# Patient Record
Sex: Male | Born: 1975 | Race: Black or African American | Marital: Single
Health system: Midwestern US, Community
[De-identification: ages and names within clinical notes are randomized; demographics above are authoritative.]

## PROBLEM LIST (undated history)

## (undated) DIAGNOSIS — I2510 Atherosclerotic heart disease of native coronary artery without angina pectoris: Secondary | ICD-10-CM

## (undated) DIAGNOSIS — I2119 ST elevation (STEMI) myocardial infarction involving other coronary artery of inferior wall: Secondary | ICD-10-CM

## (undated) DIAGNOSIS — E119 Type 2 diabetes mellitus without complications: Secondary | ICD-10-CM

## (undated) DIAGNOSIS — R202 Paresthesia of skin: Secondary | ICD-10-CM

## (undated) DIAGNOSIS — E13621 Other specified diabetes mellitus with foot ulcer: Secondary | ICD-10-CM

## (undated) DIAGNOSIS — W3400XA Accidental discharge from unspecified firearms or gun, initial encounter: Secondary | ICD-10-CM

## (undated) DIAGNOSIS — L97509 Non-pressure chronic ulcer of other part of unspecified foot with unspecified severity: Secondary | ICD-10-CM

## (undated) DIAGNOSIS — N189 Chronic kidney disease, unspecified: Secondary | ICD-10-CM

## (undated) HISTORY — PX: OTHER SURGICAL HISTORY: SHX169

## (undated) HISTORY — DX: Paresthesia of skin: R20.2

## (undated) HISTORY — PX: FEMUR FRACTURE SURGERY: SHX633

---
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
## 1997-10-29 HISTORY — PX: FEMUR FRACTURE SURGERY: SHX633

## 2002-07-04 ENCOUNTER — Emergency Department (HOSPITAL_COMMUNITY): Admission: EM | Admit: 2002-07-04 | Discharge: 2002-07-04 | Payer: Self-pay | Admitting: *Deleted

## 2002-07-09 ENCOUNTER — Emergency Department (HOSPITAL_COMMUNITY): Admission: EM | Admit: 2002-07-09 | Discharge: 2002-07-09 | Payer: Self-pay | Admitting: Emergency Medicine

## 2003-11-19 ENCOUNTER — Emergency Department (HOSPITAL_COMMUNITY): Admission: EM | Admit: 2003-11-19 | Discharge: 2003-11-19 | Payer: Self-pay | Admitting: Emergency Medicine

## 2009-10-19 ENCOUNTER — Emergency Department (HOSPITAL_COMMUNITY): Admission: EM | Admit: 2009-10-19 | Discharge: 2009-10-19 | Payer: Self-pay | Admitting: Internal Medicine

## 2010-01-09 ENCOUNTER — Emergency Department (HOSPITAL_COMMUNITY): Admission: EM | Admit: 2010-01-09 | Discharge: 2010-01-10 | Payer: Self-pay | Admitting: Emergency Medicine

## 2011-01-29 LAB — GLUCOSE, CAPILLARY: Glucose-Capillary: 354 mg/dL — ABNORMAL HIGH (ref 70–99)

## 2011-05-17 ENCOUNTER — Encounter: Payer: Self-pay | Admitting: Emergency Medicine

## 2011-05-17 ENCOUNTER — Emergency Department (HOSPITAL_COMMUNITY)
Admission: EM | Admit: 2011-05-17 | Discharge: 2011-05-17 | Disposition: A | Discharge status: Home / Self Care | Payer: Self-pay | Attending: Emergency Medicine | Admitting: Emergency Medicine

## 2011-05-17 DIAGNOSIS — K0889 Other specified disorders of teeth and supporting structures: Secondary | ICD-10-CM

## 2011-05-17 DIAGNOSIS — K089 Disorder of teeth and supporting structures, unspecified: Secondary | ICD-10-CM | POA: Insufficient documentation

## 2011-05-17 DIAGNOSIS — E119 Type 2 diabetes mellitus without complications: Secondary | ICD-10-CM | POA: Insufficient documentation

## 2011-05-17 DIAGNOSIS — R739 Hyperglycemia, unspecified: Secondary | ICD-10-CM

## 2011-05-17 DIAGNOSIS — F17200 Nicotine dependence, unspecified, uncomplicated: Secondary | ICD-10-CM | POA: Insufficient documentation

## 2011-05-17 LAB — BASIC METABOLIC PANEL
GFR calc Af Amer: >60 mL/min (ref >60)
GFR calc non Af Amer: >60 mL/min (ref >60)
Potassium: 4.6 mEq/L (ref 3.5–5.1)
Sodium: 132 mEq/L — ABNORMAL LOW (ref 135–145)

## 2011-05-17 MED ORDER — OXYCODONE-ACETAMINOPHEN 5-325 MG PO TABS
1.0000 | ORAL_TABLET | Freq: Once | ORAL | Status: AC
  Administered 2011-05-17: 1 via ORAL
  Filled 2011-05-17: qty 1

## 2011-05-17 MED ORDER — HYDROCODONE-ACETAMINOPHEN 5-325 MG PO TABS: ORAL_TABLET | ORAL | Status: AC

## 2011-05-17 MED ORDER — METFORMIN HCL 500 MG PO TABS: 500.0000 mg | ORAL_TABLET | Freq: Two times a day (BID) | ORAL | Status: DC

## 2011-05-17 MED ORDER — PENICILLIN V POTASSIUM 500 MG PO TABS: 500.0000 mg | ORAL_TABLET | Freq: Four times a day (QID) | ORAL | Status: AC

## 2011-05-17 MED ORDER — PENICILLIN V POTASSIUM 250 MG PO TABS
500.0000 mg | ORAL_TABLET | Freq: Once | ORAL | Status: AC
  Administered 2011-05-17: 500 mg via ORAL
  Filled 2011-05-17: qty 2

## 2011-05-17 MED ORDER — METFORMIN HCL 500 MG PO TABS
1000.0000 mg | ORAL_TABLET | Freq: Once | ORAL | Status: AC
  Administered 2011-05-17: 1000 mg via ORAL
  Filled 2011-05-17: qty 2

## 2011-05-17 NOTE — ED Notes (Signed)
Pt states right upper tooth ache for 2 days. Pt has no dentist & does not have a PCP.

## 2011-05-17 NOTE — ED Provider Notes (Addendum)
History     Chief Complaint  Patient presents with  . Dental Pain  . Hyperglycemia   HPI Comments: Patient c/o toothache for 2 days and also states that his blood sugar may be elevated.  States that he had hx of diabetes but has been without his medication or glucometer for several weeks to months.  Patient is a 35 y.o. male presenting with tooth pain. The history is provided by the patient.  Dental PainThe primary symptoms include mouth pain. Primary symptoms do not include dental injury, oral bleeding, headaches, fever, shortness of breath, sore throat, angioedema or cough. The symptoms began 2 days ago. The symptoms are worsening. The symptoms are chronic. The symptoms occur constantly.  Mouth pain began 24 -48 hours ago. Mouth pain occurs constantly. Mouth pain is unchanged. Affected locations include: teeth.  Additional symptoms include: dental sensitivity to temperature and gum tenderness. Additional symptoms do not include: gum swelling, purulent gums, trismus, facial swelling, trouble swallowing, pain with swallowing, excessive salivation, drooling and ear pain.    Past Medical History  Diagnosis Date  . Diabetes mellitus     Past Surgical History  Procedure Date  . Femur fracture surgery     History reviewed. No pertinent family history.  History  Substance Use Topics  . Smoking status: Current Everyday Smoker -- 0.5 packs/day  . Smokeless tobacco: Not on file  . Alcohol Use: 0.0 oz/week    1-2 Cans of beer per week      Review of Systems  Constitutional: Positive for chills. Negative for fever.  HENT: Positive for dental problem. Negative for ear pain, sore throat, facial swelling, drooling, trouble swallowing, neck pain and neck stiffness.   Respiratory: Negative for cough, chest tightness and shortness of breath.   Cardiovascular: Negative for chest pain and leg swelling.  Gastrointestinal: Negative for nausea, vomiting and constipation.  Genitourinary:  Negative for dysuria and frequency.  Musculoskeletal: Negative.   Skin: Negative.   Neurological: Negative for dizziness, seizures, syncope, facial asymmetry, speech difficulty, weakness, numbness and headaches.  Hematological: Does not bruise/bleed easily.  Psychiatric/Behavioral: Negative for behavioral problems and confusion.    Physical Exam  BP 162/113  Pulse 99  Temp(Src) 98.4 F (36.9 C) (Oral)  Resp 20  Ht 5\' 11"  (1.803 m)  Wt 200 lb (90.719 kg)  BMI 27.89 kg/m2  SpO2 98%  Physical Exam  Nursing note and vitals reviewed. Constitutional: He appears well-developed and well-nourished.  Non-toxic appearance. He does not have a sickly appearance. He appears distressed.       Patient is holding his jaw and appears distressed due to dental pain  HENT:  Head: Normocephalic and atraumatic.  Right Ear: External ear normal.  Left Ear: External ear normal.  Mouth/Throat: Oropharynx is clear and moist.  Eyes: EOM are normal. Pupils are equal, round, and reactive to light.  Neck: Normal range of motion. Neck supple. No thyromegaly present.  Cardiovascular: Normal rate, regular rhythm and normal heart sounds.   Pulmonary/Chest: No respiratory distress. He has no wheezes. He exhibits no tenderness.  Abdominal: Soft. He exhibits no distension. There is no tenderness. There is no rebound and no guarding.  Musculoskeletal: Normal range of motion. He exhibits no edema and no tenderness.  Lymphadenopathy:    He has no cervical adenopathy.  Neurological: He is alert. He exhibits normal muscle tone. Coordination normal.  Skin: Skin is warm and dry.  Psychiatric: He has a normal mood and affect.    ED Course  Procedures  MDM  2115  Patient feeling better, vitals stable.  No clinical signs or symptoms of DKA.  Agrees to f/u with the HD for recheck of his diabetes.  I will restart his metformin and write rx for his glucometer strips.         Danah Reinecke L. [place], [place] 05/27/11  0011  Theta Leaf L. [place], [place] 06/21/11 1439

## 2011-05-17 NOTE — ED Notes (Signed)
Pt c/o rt sided mouth pain x 2 days.

## 2011-05-19 ENCOUNTER — Emergency Department (HOSPITAL_COMMUNITY)
Admission: EM | Admit: 2011-05-19 | Discharge: 2011-05-19 | Disposition: A | Discharge status: Home / Self Care | Payer: Self-pay | Attending: Emergency Medicine | Admitting: Emergency Medicine

## 2011-05-19 ENCOUNTER — Encounter (HOSPITAL_COMMUNITY): Payer: Self-pay

## 2011-05-19 DIAGNOSIS — K089 Disorder of teeth and supporting structures, unspecified: Secondary | ICD-10-CM | POA: Insufficient documentation

## 2011-05-19 DIAGNOSIS — K029 Dental caries, unspecified: Secondary | ICD-10-CM | POA: Insufficient documentation

## 2011-05-19 DIAGNOSIS — K1370 Unspecified lesions of oral mucosa: Secondary | ICD-10-CM | POA: Insufficient documentation

## 2011-05-19 DIAGNOSIS — F17200 Nicotine dependence, unspecified, uncomplicated: Secondary | ICD-10-CM | POA: Insufficient documentation

## 2011-05-19 DIAGNOSIS — K1379 Other lesions of oral mucosa: Secondary | ICD-10-CM

## 2011-05-19 MED ORDER — TRAMADOL HCL 50 MG PO TABS
50.0000 mg | ORAL_TABLET | Freq: Once | ORAL | Status: AC
  Administered 2011-05-19: 50 mg via ORAL
  Filled 2011-05-19: qty 1

## 2011-05-19 NOTE — ED Provider Notes (Signed)
History     Chief Complaint  Patient presents with  . Dental Pain    mouth pain   HPI Comments: Patient with poor dental hygiene, multiple caries, here with continued mouth and tooth pain. Seen yesterday and started on Pen VK, hydrocodone. He states medicine does not help pain behind his front teeth and top of his mouth. Denies fever, chills, nausea, vomiting.  Patient is a 35 y.o. male presenting with tooth pain. The history is provided by the patient.  Dental PainThe primary symptoms include mouth pain and oral lesions. The symptoms began 3 to 5 days ago. The symptoms are unchanged. The symptoms occur constantly.  Affected locations include: gum(s), teeth and palate. At its highest the mouth pain was at 10/10. The mouth pain is currently at 10/10.  The oral lesion is unchanged. Affected locations include: palate. The lesion is described as ulcerous and fluid-filled.  Additional symptoms include: gum tenderness. Additional symptoms do not include: dental sensitivity to temperature, gum swelling, pain with swallowing and excessive salivation.    Past Medical History  Diagnosis Date  . Diabetes mellitus     Past Surgical History  Procedure Date  . Femur fracture surgery     History reviewed. No pertinent family history.  History  Substance Use Topics  . Smoking status: Current Everyday Smoker -- 0.5 packs/day  . Smokeless tobacco: Not on file  . Alcohol Use: 0.0 oz/week    1-2 Cans of beer per week      Review of Systems  All other systems reviewed and are negative.    Physical Exam  BP 121/85  Pulse 137  Temp(Src) 99 F (37.2 C) (Oral)  Resp 20  SpO2 97%  Physical Exam  Nursing note and vitals reviewed. Constitutional: He is oriented to person, place, and time. He appears well-developed and well-nourished. He appears distressed.  HENT:  Head: Normocephalic and atraumatic.       Shallow apthos ulcerations with blisters to palate and behind front upper teeth. No  other lesions noted. Multiple caris.  Eyes: EOM are normal. Pupils are equal, round, and reactive to light.  Neck: Normal range of motion.  Cardiovascular:       tachycardic  Pulmonary/Chest: Effort normal and breath sounds normal.  Abdominal: Soft.  Musculoskeletal: Normal range of motion.  Lymphadenopathy:    He has no cervical adenopathy.  Neurological: He is alert and oriented to person, place, and time.  Skin: Skin is warm and dry.    ED Course  Procedures  MDM       Gypsy Balsam. Olin Hauser, MD 05/19/11 [id number]

## 2011-05-19 NOTE — ED Notes (Signed)
MD at bedside. 

## 2011-05-19 NOTE — ED Notes (Signed)
Pt seen here last Wednesday for tooth pain, states he also had bumps/?abcess to roof of mouth which is worse now.

## 2011-05-20 ENCOUNTER — Emergency Department (HOSPITAL_COMMUNITY)
Admission: EM | Admit: 2011-05-20 | Discharge: 2011-05-20 | Disposition: A | Discharge status: Home / Self Care | Payer: Self-pay | Attending: Emergency Medicine | Admitting: Emergency Medicine

## 2011-05-20 DIAGNOSIS — K089 Disorder of teeth and supporting structures, unspecified: Secondary | ICD-10-CM | POA: Insufficient documentation

## 2011-05-23 NOTE — ED Provider Notes (Signed)
History     Chief Complaint  Patient presents with  . Dental Pain  . Hyperglycemia   HPI  Past Medical History  Diagnosis Date  . Diabetes mellitus     Past Surgical History  Procedure Date  . Femur fracture surgery     History reviewed. No pertinent family history.  History  Substance Use Topics  . Smoking status: Current Everyday Smoker -- 0.5 packs/day  . Smokeless tobacco: Not on file  . Alcohol Use: 0.0 oz/week    1-2 Cans of beer per week      Review of Systems  Physical Exam  BP 162/113  Pulse 99  Temp(Src) 98.4 F (36.9 C) (Oral)  Resp 20  Ht 5\' 11"  (1.803 m)  Wt 200 lb (90.719 kg)  BMI 27.89 kg/m2  SpO2 98%  Physical Exam  ED Course  Procedures  MDM Medical screening examination/treatment/procedure(s) were performed by non-physician practitioner and as supervising physician I was immediately available for consultation/collaboration.       Dot Lanes, MD 05/23/11 2147

## 2011-05-30 ENCOUNTER — Encounter (HOSPITAL_COMMUNITY): Payer: Self-pay | Admitting: *Deleted

## 2011-05-30 DIAGNOSIS — Z5320 Procedure and treatment not carried out because of patient's decision for unspecified reasons: Secondary | ICD-10-CM | POA: Insufficient documentation

## 2011-05-30 DIAGNOSIS — R5383 Other fatigue: Secondary | ICD-10-CM | POA: Insufficient documentation

## 2011-05-30 DIAGNOSIS — R5381 Other malaise: Secondary | ICD-10-CM | POA: Insufficient documentation

## 2011-05-30 NOTE — ED Notes (Signed)
Pt states that he felt bad earlier in the day, headache, weakness, toothache, abscess on roof of mouth

## 2011-05-30 NOTE — ED Provider Notes (Signed)
Medical screening examination/treatment/procedure(s) were performed by non-physician practitioner and as supervising physician I was immediately available for consultation/collaboration.   Dot Lanes, MD 05/30/11 [phone]

## 2011-05-31 ENCOUNTER — Emergency Department (HOSPITAL_COMMUNITY)
Admission: EM | Admit: 2011-05-31 | Discharge: 2011-05-31 | Discharge status: Against Medical Advice | Payer: Self-pay | Attending: *Deleted | Admitting: *Deleted

## 2015-02-28 ENCOUNTER — Encounter (HOSPITAL_COMMUNITY): Payer: Self-pay

## 2015-02-28 ENCOUNTER — Emergency Department (INDEPENDENT_AMBULATORY_CARE_PROVIDER_SITE_OTHER)
Admission: EM | Admit: 2015-02-28 | Discharge: 2015-02-28 | Disposition: A | Discharge status: Home / Self Care | Payer: Self-pay | Source: Home / Self Care | Attending: Family Medicine | Admitting: Family Medicine

## 2015-02-28 DIAGNOSIS — L02611 Cutaneous abscess of right foot: Secondary | ICD-10-CM

## 2015-02-28 DIAGNOSIS — L03115 Cellulitis of right lower limb: Secondary | ICD-10-CM

## 2015-02-28 MED ORDER — CEFTRIAXONE SODIUM 1 G IJ SOLR
INTRAMUSCULAR | Status: AC
Start: 2015-02-28 — End: 2015-02-28
  Filled 2015-02-28: qty 10

## 2015-02-28 MED ORDER — BACITRACIN 500 UNIT/GM EX OINT
1.0000 "application " | TOPICAL_OINTMENT | Freq: Once | CUTANEOUS | Status: AC
  Administered 2015-02-28: 1 via TOPICAL

## 2015-02-28 MED ORDER — CEFTRIAXONE SODIUM 1 G IJ SOLR
1.0000 g | Freq: Once | INTRAMUSCULAR | Status: AC
  Administered 2015-02-28: 1 g via INTRAMUSCULAR

## 2015-02-28 MED ORDER — CLINDAMYCIN HCL 300 MG PO CAPS: 300.0000 mg | ORAL_CAPSULE | Freq: Three times a day (TID) | ORAL | Status: DC

## 2015-02-28 MED ORDER — LIDOCAINE HCL (PF) 1 % IJ SOLN
INTRAMUSCULAR | Status: AC
  Filled 2015-02-28: qty 5

## 2015-02-28 NOTE — ED Notes (Signed)
Medication mixed w lidocaine for comfort

## 2015-02-28 NOTE — ED Provider Notes (Signed)
CSN: [id number]     Arrival date & time 02/28/15  1738 History   First MD Initiated Contact with Patient 02/28/15 1840     Chief Complaint  Patient presents with  . Foot Problem   (Consider location/radiation/quality/duration/timing/severity/associated sxs/prior Treatment) HPI Comments: 39 year old male from a halfway house and recently out of prison has a PMH of type 2 diabetes mellitus. Has been one week ago he developed a sore area to the base of the right fifth toe. This area has enlarged to a vesicular type lesion with underlying induration and swelling of the foot. He states the vesicle has been draining malodorous fluid.   Past Medical History  Diagnosis Date  . Diabetes mellitus    Past Surgical History  Procedure Laterality Date  . Femur fracture surgery     History reviewed. No pertinent family history. History  Substance Use Topics  . Smoking status: Current Every Day Smoker -- 0.50 packs/day  . Smokeless tobacco: Not on file  . Alcohol Use: 0.0 oz/week    1-2 Cans of beer per week    Review of Systems  Constitutional: Negative for fever and activity change.  Gastrointestinal: Negative.   Musculoskeletal: Negative.   Skin: Positive for color change and wound.  Neurological: Positive for numbness.  Psychiatric/Behavioral: Negative.   All other systems reviewed and are negative.   Allergies  Review of patient's allergies indicates no known allergies.  Home Medications   Prior to Admission medications   Medication Sig Start Date End Date Taking? Authorizing Provider  glipiZIDE (GLUCOTROL) 5 MG tablet Take by mouth daily before breakfast.   Yes Historical Provider, MD  clindamycin (CLEOCIN) 300 MG capsule Take 1 capsule (300 mg total) by mouth 3 (three) times daily. 02/28/15   Janne Napoleon, NP  metFORMIN (GLUCOPHAGE) 500 MG tablet Take 1 tablet (500 mg total) by mouth 2 (two) times daily with a meal. 05/17/11 05/16/12  Tammy Triplett, PA-C   BP 153/93 mmHg  Pulse 84   Temp(Src) 98.5 F (36.9 C) (Oral)  Resp 18  SpO2 99% Physical Exam  Constitutional: He is oriented to person, place, and time. He appears well-developed and well-nourished. No distress.  Pulmonary/Chest: Effort normal. No respiratory distress.  Musculoskeletal: Normal range of motion. He exhibits edema and tenderness.  Neurological: He is alert and oriented to person, place, and time. He exhibits normal muscle tone.  Skin: Skin is warm and dry.  Right foot with edema. Small areas of light erythema. There is a 3 cm annular lesion to the fifth toe aspect of the foot at its base with visible fluid under the cover. There is surrounding erythema. No lymphangitis.   Psychiatric: He has a normal mood and affect.  Nursing note and vitals reviewed.   ED Course  INCISION AND DRAINAGE Date/Time: 02/28/2015 7:21 PM Performed by: Marcha Dutton, Kyarah Enamorado Authorized by: Gregor Hams Consent: Verbal consent obtained. Risks and benefits: risks, benefits and alternatives were discussed Consent given by: patient Patient understanding: patient states understanding of the procedure being performed Patient identity confirmed: verbally with patient Type: abscess Body area: lower extremity Location details: right foot Local anesthetic: topical anesthetic Patient sedated: no Scalpel size: 11 Incision type: single straight Complexity: simple Drainage: purulent and  serosanguinous Drainage amount: scant Wound treatment: wound left open Patient tolerance: Patient tolerated the procedure well with no immediate complications   (including critical care time) Labs Review Labs Reviewed  CULTURE, ROUTINE-ABSCESS    Imaging Review No results found.   MDM  1. Cellulitis of foot, right   2. Foot abscess, right    Diabetic foot ulcer forming with cellulitis. The lesion was incised. There was serosanguineous/bloody fluid with a very small amount of purulence expressed from the wound. Much of the underlying  tissue is inflammation and induration. Betadine soak Apply dressing with bacitracin Clindamycin 300 mg 3 times a day Ceftriaxone 1 g IM now Keep the wound clean, soap and water daily and change dressing daily Return to urgent care in 2 days for wound check. If not improving will need to send to wound care center.    Janne Napoleon, NP 02/28/15 [phone]

## 2015-02-28 NOTE — ED Notes (Signed)
Patient concerned about 1 week duration of pain, bad smell, draining right foot.

## 2015-02-28 NOTE — Discharge Instructions (Signed)
Abscess An abscess is an infected area that contains a collection of pus and debris.It can occur in almost any part of the body. An abscess is also known as a furuncle or boil. CAUSES  An abscess occurs when tissue gets infected. This can occur from blockage of oil or sweat glands, infection of hair follicles, or a minor injury to the skin. As the body tries to fight the infection, pus collects in the area and creates pressure under the skin. This pressure causes pain. People with weakened immune systems have difficulty fighting infections and get certain abscesses more often.  SYMPTOMS Usually an abscess develops on the skin and becomes a painful mass that is red, warm, and tender. If the abscess forms under the skin, you may feel a moveable soft area under the skin. Some abscesses break open (rupture) on their own, but most will continue to get worse without care. The infection can spread deeper into the body and eventually into the bloodstream, causing you to feel ill.  DIAGNOSIS  Your caregiver will take your medical history and perform a physical exam. A sample of fluid may also be taken from the abscess to determine what is causing your infection. TREATMENT  Your caregiver may prescribe antibiotic medicines to fight the infection. However, taking antibiotics alone usually does not cure an abscess. Your caregiver may need to make a small cut (incision) in the abscess to drain the pus. In some cases, gauze is packed into the abscess to reduce pain and to continue draining the area. HOME CARE INSTRUCTIONS   Only take over-the-counter or prescription medicines for pain, discomfort, or fever as directed by your caregiver.  If you were prescribed antibiotics, take them as directed. Finish them even if you start to feel better.  If gauze is used, follow your caregiver's directions for changing the gauze. Clean with soap and water daily and keep dressed.  To avoid spreading the infection:  Keep  your draining abscess covered with a bandage.  Wash your hands well.  Do not share personal care items, towels, or whirlpools with others.  Avoid skin contact with others.  Keep your skin and clothes clean around the abscess.  Keep all follow-up appointments as directed by your caregiver. SEEK MEDICAL CARE IF:   You have increased pain, swelling, redness, fluid drainage, or bleeding.  You have muscle aches, chills, or a general ill feeling.  You have a fever. MAKE SURE YOU:   Understand these instructions.  Will watch your condition.  Will get help right away if you are not doing well or get worse. Document Released: 07/25/2005 Document Revised: 04/15/2012 Document Reviewed: 12/28/2011 [facility] Patient Information 2015 [place], [place]. This information is not intended to replace advice given to you by your health care provider. Make sure you discuss any questions you have with your health care provider.  Cellulitis Cellulitis is an infection of the skin and the tissue beneath it. The infected area is usually red and tender. Cellulitis occurs most often in the arms and lower legs.  CAUSES  Cellulitis is caused by bacteria that enter the skin through cracks or cuts in the skin. The most common types of bacteria that cause cellulitis are staphylococci and streptococci. SIGNS AND SYMPTOMS   Redness and warmth.  Swelling.  Tenderness or pain.  Fever. DIAGNOSIS  Your health care provider can usually determine what is wrong based on a physical exam. Blood tests may also be done. TREATMENT  Treatment usually involves taking an antibiotic  medicine. HOME CARE INSTRUCTIONS   Take your antibiotic medicine as directed by your health care provider. Finish the antibiotic even if you start to feel better.  Keep the infected arm or leg elevated to reduce swelling.  Apply a warm cloth to the affected area up to 4 times per day to relieve pain.  Take medicines only as directed by  your health care provider.  Keep all follow-up visits as directed by your health care provider. SEEK MEDICAL CARE IF:   You notice red streaks coming from the infected area.  Your red area gets larger or turns dark in color.  Your bone or joint underneath the infected area becomes painful after the skin has healed.  Your infection returns in the same area or another area.  You notice a swollen bump in the infected area.  You develop new symptoms.  You have a fever. SEEK IMMEDIATE MEDICAL CARE IF:   You feel very sleepy.  You develop vomiting or diarrhea.  You have a general ill feeling (malaise) with muscle aches and pains. MAKE SURE YOU:   Understand these instructions.  Will watch your condition.  Will get help right away if you are not doing well or get worse. Document Released: 07/25/2005 Document Revised: 03/01/2014 Document Reviewed: 12/31/2011 [facility] Patient Information 2015 [place], [place]. This information is not intended to replace advice given to you by your health care provider. Make sure you discuss any questions you have with your health care provider.

## 2015-03-02 ENCOUNTER — Encounter (HOSPITAL_COMMUNITY): Payer: Self-pay | Admitting: Emergency Medicine

## 2015-03-02 ENCOUNTER — Emergency Department (INDEPENDENT_AMBULATORY_CARE_PROVIDER_SITE_OTHER)
Admission: EM | Admit: 2015-03-02 | Discharge: 2015-03-02 | Disposition: A | Discharge status: Home / Self Care | Payer: Self-pay | Source: Home / Self Care | Attending: Family Medicine | Admitting: Family Medicine

## 2015-03-02 DIAGNOSIS — L97509 Non-pressure chronic ulcer of other part of unspecified foot with unspecified severity: Secondary | ICD-10-CM

## 2015-03-02 DIAGNOSIS — E08621 Diabetes mellitus due to underlying condition with foot ulcer: Secondary | ICD-10-CM

## 2015-03-02 NOTE — ED Provider Notes (Signed)
CSN: [id number]     Arrival date & time 03/02/15  1749 History   First MD Initiated Contact with Patient 03/02/15 1857     Chief Complaint  Patient presents with  . Follow-up   (Consider location/radiation/quality/duration/timing/severity/associated sxs/prior Treatment) HPI      39 year old male presents for follow-up of the diabetic foot ulcer on his left lateral foot adjacent to the fifth MTP. This started approximately one week ago. He was seen here a few days ago, this was treated with incision and drainage and he was started on clindamycin. He was only able to pick up the prescription yesterday and he got 1 dose yesterday and 1 dose so far today. He feels like this may be slightly better since he had it incised and drained. He denies any systemic symptoms. He specifically denies that this is a long-standing ulcer that has been there for longer than 1 week. Denies any pain elsewhere or any other similar infections or lesions. He lives in a halfway house, he was recently incarcerated.   Past Medical History  Diagnosis Date  . Diabetes mellitus    Past Surgical History  Procedure Laterality Date  . Femur fracture surgery     No family history on file. History  Substance Use Topics  . Smoking status: Current Every Day Smoker -- 0.50 packs/day  . Smokeless tobacco: Not on file  . Alcohol Use: 0.0 oz/week    1-2 Cans of beer per week    Review of Systems  Skin: Positive for wound (left foot ulcer, see history of present illness).  All other systems reviewed and are negative.   Allergies  Review of patient's allergies indicates no known allergies.  Home Medications   Prior to Admission medications   Medication Sig Start Date End Date Taking? Authorizing Provider  clindamycin (CLEOCIN) 300 MG capsule Take 1 capsule (300 mg total) by mouth 3 (three) times daily. 02/28/15  Yes Janne Napoleon, NP  glipiZIDE (GLUCOTROL) 5 MG tablet Take by mouth daily before breakfast.   Yes Historical  Provider, MD  metFORMIN (GLUCOPHAGE) 500 MG tablet Take 1 tablet (500 mg total) by mouth 2 (two) times daily with a meal. 05/17/11 05/16/12  Tammy Triplett, PA-C   BP 142/94 mmHg  Pulse 88  Temp(Src) 99 F (37.2 C) (Oral)  Resp 16  SpO2 99% Physical Exam  Constitutional: He is oriented to person, place, and time. He appears well-developed and well-nourished. No distress.  HENT:  Head: Normocephalic.  Pulmonary/Chest: Effort normal. No respiratory distress.  Neurological: He is alert and oriented to person, place, and time. Coordination normal.  Skin: Skin is warm and dry. Lesion (See image below. Minimal tenderness, no surrounding erythema or induration) noted. No rash noted. He is not diaphoretic.  Psychiatric: He has a normal mood and affect. Judgment normal.  Nursing note and vitals reviewed.     ED Course  Procedures (including critical care time) Labs Review Labs Reviewed - No data to display  Imaging Review No results found.   MDM   1. Diabetic foot ulcer associated with diabetes mellitus due to underlying condition, unspecified laterality    he should continue the antibiotics and follow-up again in 3 days for reassessment. We're unable to assess how well the antibiotics may be working because he only started them yesterday. No signs of osteomyelitis at this point.   Liam Graham, PA-C 03/02/15 2144

## 2015-03-02 NOTE — ED Notes (Signed)
Pt is here for a f/u from 5/2; cellulitis of left foot; taking antibiotics and tolerating well Reports it's getting better Alert, no signs of acute distress.

## 2015-03-02 NOTE — Discharge Instructions (Signed)
Skin Ulcer A skin ulcer is an open sore that can be shallow or deep. Skin ulcers sometimes become infected and are difficult to treat. It may be 1 month or longer before real healing progress is made. CAUSES   Injury.  Problems with the veins or arteries.  Diabetes.  Insect bites.  Bedsores.  Inflammatory conditions. SYMPTOMS   Pain, redness, swelling, and tenderness around the ulcer.  Fever.  Bleeding from the ulcer.  Yellow or clear fluid coming from the ulcer. DIAGNOSIS  There are many types of skin ulcers. Any open sores will be examined. Certain tests will be done to determine the kind of ulcer you have. The right treatment depends on the type of ulcer you have. TREATMENT  Treatment is a long-term challenge. It may include:  Wearing an elastic wrap, compression stockings, or gel cast over the ulcer area.  Taking antibiotic medicines or putting antibiotic creams on the affected area if there is an infection. HOME CARE INSTRUCTIONS  Put on your bandages (dressings), wraps, or casts over the ulcer as directed by your caregiver.  Change all dressings as directed by your caregiver.  Take all medicines as directed by your caregiver.  Keep the affected area clean and dry.  Avoid injuries to the affected area.  Eat a well-balanced, healthy diet that includes plenty of fruit and vegetables.  If you smoke, consider quitting or decreasing the amount of cigarettes you smoke.  Once the ulcer heals, get regular exercise as directed by your caregiver.  Work with your caregiver to make sure your blood pressure, cholesterol, and diabetes are well-controlled.  Keep your skin moisturized. Dry skin can crack and lead to skin ulcers. SEEK IMMEDIATE MEDICAL CARE IF:   Your pain gets worse.  You have swelling, redness, or fluids around the ulcer.  You have chills.  You have a fever. MAKE SURE YOU:   Understand these instructions.  Will watch your condition.  Will get  help right away if you are not doing well or get worse. Document Released: 11/22/2004 Document Revised: 01/07/2012 Document Reviewed: 06/01/2011 [facility] Patient Information 2015 [place], [place]. This information is not intended to replace advice given to you by your health care provider. Make sure you discuss any questions you have with your health care provider.  How to Avoid Diabetes Problems You can do a lot to prevent or slow down diabetes problems. Following your diabetes plan and taking care of yourself can reduce your risk of serious or life-threatening complications. Below, you will find certain things you can do to prevent diabetes problems. MANAGE YOUR DIABETES Follow your health care provider's, nurse educator's, and dietitian's instructions for managing your diabetes. They will teach you the basics of diabetes care. They can help answer questions you may have. Learn about diabetes and make healthy choices regarding eating and physical activity. Monitor your blood glucose level regularly. Your health care provider will help you decide how often to check your blood glucose level depending on your treatment goals and how well you are meeting them.  DO NOT USE NICOTINE Nicotine and diabetes are a dangerous combination. Nicotine raises your risk for diabetes problems. If you quit using nicotine, you will lower your risk for heart attack, stroke, nerve disease, and kidney disease. Your cholesterol and your blood pressure levels may improve. Your blood circulation will also improve. Do not use any tobacco products, including cigarettes, chewing tobacco, or electronic cigarettes. If you need help quitting, ask your health care provider. KEEP YOUR BLOOD  PRESSURE UNDER CONTROL Keeping your blood pressure under control will help prevent damage to your eyes, kidneys, heart, and blood vessels. Blood pressure consists of two numbers. The top number should be below 120, and the bottom number should be below  80 (120/80). Keep your blood pressure as close to these numbers as you can. If you already have kidney disease, you may want even lower blood pressure to protect your kidneys. Talk to your health care provider to make sure that your blood pressure goal is right for your needs. Meal planning, medicines, and exercise can help you reach your blood pressure target. Have your blood pressure checked at every visit with your health care provider. KEEP YOUR CHOLESTEROL UNDER CONTROL Normal cholesterol levels will help prevent heart disease and stroke. These are the biggest health problems for people with diabetes. Keeping cholesterol levels under control can also help with blood flow. Have your cholesterol level checked at least once a year. Your health care provider may prescribe a medicine known as a statin. Statins lower your cholesterol. If you are not taking a statin, ask your health care provider if you should be. Meal planning, exercise, and medicines can help you reach your cholesterol targets.  SCHEDULE AND KEEP YOUR ANNUAL PHYSICAL EXAMS AND EYE EXAMS Your health care provider will tell you how often he or she wants to see you depending on your plan of treatment. It is important that you keep these appointments so that possible problems can be identified early and complications can be avoided or treated.  Every visit with your health care provider should include your weight, blood pressure, and an evaluation of your blood glucose control.  Your hemoglobin A1c should be checked:  At least twice a year if you are at your goal.  Every 3 months if there are changes in treatment.  If you are not meeting your goals.  Your blood lipids should be checked yearly. You should also be checked yearly to see if you have protein in your urine (microalbumin).  Schedule a dilated eye exam within 5 years of your diagnosis if you have type 1 diabetes, and then yearly. Schedule a dilated eye exam at diagnosis if you  have type 2 diabetes, and then yearly. All exams thereafter can be extended to every 2 to 3 years if one or more exams have been normal. KEEP YOUR VACCINES CURRENT The flu vaccine is recommended yearly. The formula for the vaccine changes every year and needs to be updated for the best protection against current viruses. It is recommended that people with diabetes who are over 69 years old get the pneumonia vaccine. In some cases, two separate shots may be given. Ask your health care provider if your pneumonia vaccination is up-to-date. However, there are some instances where another vaccine is recommended. Check with your health care provider. TAKE CARE OF YOUR FEET  Diabetes may cause you to have a poor blood supply (circulation) to your legs and feet. Because of this, the skin may be thinner, break easier, and heal more slowly. You also may have nerve damage in your legs and feet, causing decreased feeling. You may not notice minor injuries to your feet that could lead to serious problems or infections. Taking care of your feet is very important. Visual foot exams are performed at every routine medical visit. The exams check for cuts, injuries, or other problems with the feet. A comprehensive foot exam should be done yearly. This includes visual inspection as well as  assessing foot pulses and testing for loss of sensation. You should also do the following:  Inspect your feet daily for cuts, calluses, blisters, ingrown toenails, and signs of infection, such as redness, swelling, or pus.  Wash and dry your feet thoroughly, especially between the toes.  Avoid soaking your feet regularly in hot water baths.  Moisturize dry skin with lotion, avoiding areas between your toes.  Cut toenails straight across and file the edges.  Avoid shoes that do not fit well or have areas that irritate your skin.  Avoid going barefooted or wearing only socks. Your feet need protection. TAKE CARE OF YOUR  TEETH People with poorly controlled diabetes are more likely to have gum (periodontal) disease. These infections make diabetes harder to control. Periodontal diseases, if left untreated, can lead to tooth loss. Brush your teeth twice a day, floss, and see your dentist for checkups and cleaning every 6 months, or 2 times a year. ASK YOUR HEALTH CARE PROVIDER ABOUT TAKING ASPIRIN Taking aspirin daily is recommended to help prevent cardiovascular disease in people with and without diabetes. Ask your health care provider if this would benefit you and what dose he or she would recommend. DRINK RESPONSIBLY Moderate amounts of alcohol (less than 1 drink per day for adult women and less than 2 drinks per day for adult men) have a minimal effect on blood glucose if ingested with food. It is important to eat food with alcohol to avoid hypoglycemia. People should avoid alcohol if they have a history of alcohol abuse or dependence, if they are pregnant, and if they have liver disease, pancreatitis, advanced neuropathy, or severe hypertriglyceridemia. LESSEN STRESS Living with diabetes can be stressful. When you are under stress, your blood glucose may be affected in two ways:  Stress hormones may cause your blood glucose to rise.  You may be distracted from taking good care of yourself. It is a good idea to be aware of your stress level and make changes that are necessary to help you better manage challenging situations. Support groups, planned relaxation, a hobby you enjoy, meditation, healthy relationships, and exercise all work to lower your stress level. If your efforts do not seem to be helping, get help from your health care provider or a trained mental health professional. Document Released: 07/03/2011 Document Revised: 03/01/2014 Document Reviewed: 12/09/2013 [facility] Patient Information 2015 [place], [place]. This information is not intended to replace advice given to you by your health care provider. Make  sure you discuss any questions you have with your health care provider.

## 2015-03-03 NOTE — ED Notes (Signed)
Abscess culture R foot: Abund[employer] Group B strep. Treated with I and D and Clindamycin.  Lab shown to Dr. Georgina Snell and he said Clindamycin should cover strep. [name] 03/03/2015

## 2015-03-04 LAB — CULTURE, ROUTINE-ABSCESS

## 2015-03-05 ENCOUNTER — Emergency Department (INDEPENDENT_AMBULATORY_CARE_PROVIDER_SITE_OTHER)
Admission: EM | Admit: 2015-03-05 | Discharge: 2015-03-05 | Disposition: A | Discharge status: Home / Self Care | Payer: Self-pay | Source: Home / Self Care | Attending: Family Medicine | Admitting: Family Medicine

## 2015-03-05 ENCOUNTER — Encounter (HOSPITAL_COMMUNITY): Payer: Self-pay | Admitting: *Deleted

## 2015-03-05 DIAGNOSIS — L97519 Non-pressure chronic ulcer of other part of right foot with unspecified severity: Secondary | ICD-10-CM

## 2015-03-05 DIAGNOSIS — I10 Essential (primary) hypertension: Secondary | ICD-10-CM

## 2015-03-05 DIAGNOSIS — E11621 Type 2 diabetes mellitus with foot ulcer: Secondary | ICD-10-CM

## 2015-03-05 DIAGNOSIS — L0291 Cutaneous abscess, unspecified: Secondary | ICD-10-CM

## 2015-03-05 HISTORY — DX: Accidental discharge from unspecified firearms or gun, initial encounter: W34.00XA

## 2015-03-05 MED ORDER — SILVER SULFADIAZINE 1 % EX CREA: 1.0000 "application " | TOPICAL_CREAM | Freq: Every day | CUTANEOUS | Status: DC
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
## 2015-03-06 NOTE — ED Notes (Signed)
Patient in for recheck yesterday, on Rx, improving and will return for recheck

## 2015-03-26 ENCOUNTER — Emergency Department (HOSPITAL_COMMUNITY): Payer: Self-pay

## 2015-03-26 ENCOUNTER — Encounter (HOSPITAL_COMMUNITY): Payer: Self-pay | Admitting: Emergency Medicine

## 2015-03-26 ENCOUNTER — Inpatient Hospital Stay (HOSPITAL_COMMUNITY)
Admission: EM | Admit: 2015-03-26 | Discharge: 2015-03-30 | DRG: 871 | Disposition: A | Discharge status: Home / Self Care | Payer: Self-pay | Attending: Internal Medicine | Admitting: Internal Medicine

## 2015-03-26 DIAGNOSIS — J189 Pneumonia, unspecified organism: Secondary | ICD-10-CM | POA: Diagnosis present

## 2015-03-26 DIAGNOSIS — N179 Acute kidney failure, unspecified: Secondary | ICD-10-CM | POA: Diagnosis present

## 2015-03-26 DIAGNOSIS — E11621 Type 2 diabetes mellitus with foot ulcer: Secondary | ICD-10-CM | POA: Diagnosis present

## 2015-03-26 DIAGNOSIS — G43909 Migraine, unspecified, not intractable, without status migrainosus: Secondary | ICD-10-CM | POA: Diagnosis present

## 2015-03-26 DIAGNOSIS — F17210 Nicotine dependence, cigarettes, uncomplicated: Secondary | ICD-10-CM | POA: Diagnosis present

## 2015-03-26 DIAGNOSIS — E871 Hypo-osmolality and hyponatremia: Secondary | ICD-10-CM | POA: Diagnosis present

## 2015-03-26 DIAGNOSIS — E1142 Type 2 diabetes mellitus with diabetic polyneuropathy: Secondary | ICD-10-CM | POA: Diagnosis present

## 2015-03-26 DIAGNOSIS — E1159 Type 2 diabetes mellitus with other circulatory complications: Secondary | ICD-10-CM

## 2015-03-26 DIAGNOSIS — J181 Lobar pneumonia, unspecified organism: Secondary | ICD-10-CM

## 2015-03-26 DIAGNOSIS — A419 Sepsis, unspecified organism: Principal | ICD-10-CM | POA: Diagnosis present

## 2015-03-26 DIAGNOSIS — R200 Anesthesia of skin: Secondary | ICD-10-CM

## 2015-03-26 DIAGNOSIS — R739 Hyperglycemia, unspecified: Secondary | ICD-10-CM

## 2015-03-26 DIAGNOSIS — E1165 Type 2 diabetes mellitus with hyperglycemia: Secondary | ICD-10-CM | POA: Diagnosis present

## 2015-03-26 DIAGNOSIS — IMO0001 Reserved for inherently not codable concepts without codable children: Secondary | ICD-10-CM

## 2015-03-26 DIAGNOSIS — R319 Hematuria, unspecified: Secondary | ICD-10-CM | POA: Diagnosis present

## 2015-03-26 DIAGNOSIS — R202 Paresthesia of skin: Secondary | ICD-10-CM | POA: Diagnosis present

## 2015-03-26 DIAGNOSIS — A481 Legionnaires' disease: Secondary | ICD-10-CM | POA: Diagnosis present

## 2015-03-26 DIAGNOSIS — L97519 Non-pressure chronic ulcer of other part of right foot with unspecified severity: Secondary | ICD-10-CM | POA: Diagnosis present

## 2015-03-26 DIAGNOSIS — R509 Fever, unspecified: Secondary | ICD-10-CM

## 2015-03-26 LAB — CBC WITH DIFFERENTIAL/PLATELET
BASOS PCT: 0 % (ref 0–1)
Basophils Absolute: 0 10*3/uL (ref 0.0–0.1)
EOS ABS: 0 10*3/uL (ref 0.0–0.7)
Eosinophils Relative: 0 % (ref 0–5)
HCT: 37.2 % — ABNORMAL LOW (ref 39.0–52.0)
Hemoglobin: 13 g/dL (ref 13.0–17.0)
Lymphocytes Relative: 5 % — ABNORMAL LOW (ref 12–46)
Lymphs Abs: 1 10*3/uL (ref 0.7–4.0)
MCH: 29.5 pg (ref 26.0–34.0)
MCHC: 34.9 g/dL (ref 30.0–36.0)
MCV: 84.4 fL (ref 78.0–100.0)
MONOS PCT: 11 % (ref 3–12)
Monocytes Absolute: 2.2 10*3/uL — ABNORMAL HIGH (ref 0.1–1.0)
NEUTROS PCT: 84 % — AB (ref 43–77)
Neutro Abs: 16.9 10*3/uL — ABNORMAL HIGH (ref 1.7–7.7)
PLATELETS: 233 10*3/uL (ref 150–400)
RBC: 4.41 MIL/uL (ref 4.22–5.81)
RDW: 12.4 % (ref 11.5–15.5)
WBC: 20.2 10*3/uL — ABNORMAL HIGH (ref 4.0–10.5)

## 2015-03-26 LAB — I-STAT TROPONIN, ED: TROPONIN I, POC: 0.01 ng/mL (ref 0.00–0.08)

## 2015-03-26 LAB — BASIC METABOLIC PANEL
ANION GAP: 12 (ref 5–15)
BUN: 15 mg/dL (ref 6–20)
CHLORIDE: 92 mmol/L — AB (ref 101–111)
CO2: 24 mmol/L (ref 22–32)
Calcium: 8.3 mg/dL — ABNORMAL LOW (ref 8.9–10.3)
Creatinine, Ser: 1.58 mg/dL — ABNORMAL HIGH (ref 0.61–1.24)
GFR calc Af Amer: >60 mL/min (ref >60)
GFR, EST NON AFRICAN AMERICAN: 54 mL/min — AB (ref >60)
GLUCOSE: 344 mg/dL — AB (ref 65–99)
POTASSIUM: 4.3 mmol/L (ref 3.5–5.1)
Sodium: 128 mmol/L — ABNORMAL LOW (ref 135–145)

## 2015-03-26 LAB — CBG MONITORING, ED
GLUCOSE-CAPILLARY: 319 mg/dL — AB (ref 65–99)
Glucose-Capillary: 304 mg/dL — ABNORMAL HIGH (ref 65–99)

## 2015-03-26 LAB — GLUCOSE, CAPILLARY
GLUCOSE-CAPILLARY: 230 mg/dL — AB (ref 65–99)
Glucose-Capillary: 231 mg/dL — ABNORMAL HIGH (ref 65–99)

## 2015-03-26 LAB — STREP PNEUMONIAE URINARY ANTIGEN: STREP PNEUMO URINARY ANTIGEN: NEGATIVE

## 2015-03-26 LAB — I-STAT CG4 LACTIC ACID, ED: LACTIC ACID, VENOUS: 1.97 mmol/L (ref 0.5–2.0)

## 2015-03-26 LAB — MRSA PCR SCREENING: MRSA by PCR: NEGATIVE

## 2015-03-26 MED ORDER — ONDANSETRON HCL 4 MG/2ML IJ SOLN
4.0000 mg | Freq: Four times a day (QID) | INTRAMUSCULAR | Status: DC | PRN
  Administered 2015-03-27: 4 mg via INTRAVENOUS
  Filled 2015-03-26: qty 2

## 2015-03-26 MED ORDER — LEVOFLOXACIN 750 MG PO TABS
750.0000 mg | ORAL_TABLET | Freq: Once | ORAL | Status: AC
  Administered 2015-03-26: 750 mg via ORAL
  Filled 2015-03-26: qty 1

## 2015-03-26 MED ORDER — SODIUM CHLORIDE 0.9 % IV SOLN
INTRAVENOUS | Status: AC
  Administered 2015-03-26 – 2015-03-27 (×3): via INTRAVENOUS

## 2015-03-26 MED ORDER — ENOXAPARIN SODIUM 40 MG/0.4ML ~~LOC~~ SOLN
40.0000 mg | SUBCUTANEOUS | Status: DC
  Administered 2015-03-27 – 2015-03-30 (×4): 40 mg via SUBCUTANEOUS
  Filled 2015-03-26 (×4): qty 0.4

## 2015-03-26 MED ORDER — SODIUM CHLORIDE 0.9 % IV BOLUS (SEPSIS)
1000.0000 mL | Freq: Once | INTRAVENOUS | Status: AC
  Administered 2015-03-26: 1000 mL via INTRAVENOUS

## 2015-03-26 MED ORDER — INSULIN ASPART 100 UNIT/ML ~~LOC~~ SOLN
0.0000 [IU] | Freq: Three times a day (TID) | SUBCUTANEOUS | Status: DC
  Administered 2015-03-27 – 2015-03-28 (×5): 2 [IU] via SUBCUTANEOUS
  Administered 2015-03-29: 3 [IU] via SUBCUTANEOUS
  Administered 2015-03-29 – 2015-03-30 (×2): 2 [IU] via SUBCUTANEOUS
  Administered 2015-03-30: 5 [IU] via SUBCUTANEOUS

## 2015-03-26 MED ORDER — DEXTROSE 5 % IV SOLN
500.0000 mg | INTRAVENOUS | Status: DC
  Administered 2015-03-27 – 2015-03-30 (×4): 500 mg via INTRAVENOUS
  Filled 2015-03-26 (×4): qty 500

## 2015-03-26 MED ORDER — CEFTRIAXONE SODIUM IN DEXTROSE 20 MG/ML IV SOLN
1.0000 g | INTRAVENOUS | Status: DC
  Administered 2015-03-26 – 2015-03-27 (×2): 1 g via INTRAVENOUS
  Filled 2015-03-26 (×3): qty 50

## 2015-03-26 MED ORDER — SODIUM CHLORIDE 0.9 % IV BOLUS (SEPSIS)
500.0000 mL | Freq: Once | INTRAVENOUS | Status: AC
  Administered 2015-03-26: 500 mL via INTRAVENOUS

## 2015-03-26 MED ORDER — ACETAMINOPHEN 325 MG PO TABS
650.0000 mg | ORAL_TABLET | Freq: Once | ORAL | Status: AC
  Administered 2015-03-26: 650 mg via ORAL
  Filled 2015-03-26: qty 2

## 2015-03-26 MED ORDER — ONDANSETRON HCL 4 MG PO TABS: 4.0000 mg | ORAL_TABLET | Freq: Four times a day (QID) | ORAL | Status: DC | PRN

## 2015-03-26 MED ORDER — SODIUM CHLORIDE 0.9 % IJ SOLN
3.0000 mL | Freq: Two times a day (BID) | INTRAMUSCULAR | Status: DC
  Administered 2015-03-26 – 2015-03-29 (×4): 3 mL via INTRAVENOUS

## 2015-03-26 MED ORDER — INSULIN ASPART 100 UNIT/ML ~~LOC~~ SOLN
0.0000 [IU] | Freq: Every day | SUBCUTANEOUS | Status: DC
  Administered 2015-03-26 – 2015-03-29 (×3): 2 [IU] via SUBCUTANEOUS

## 2015-03-26 MED ORDER — ACETAMINOPHEN 325 MG PO TABS
650.0000 mg | ORAL_TABLET | Freq: Four times a day (QID) | ORAL | Status: DC | PRN
  Administered 2015-03-26 – 2015-03-29 (×10): 650 mg via ORAL
  Filled 2015-03-26 (×10): qty 2

## 2015-03-26 MED ORDER — ACETAMINOPHEN 650 MG RE SUPP: 650.0000 mg | Freq: Four times a day (QID) | RECTAL | Status: DC | PRN
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
## 2015-03-26 NOTE — ED Notes (Signed)
Pt transporting to xray at this time. 

## 2015-03-26 NOTE — ED Notes (Signed)
Per EMS- pt presents to ED for evaluation of new onset right sided chest pain that radiates into right arm, described as an ache. States it is worse with coughing and movement. Pt also reports a headache as well as tingling in both hands. Has missed 2 doses of his diabetic medications metformin and glipizide. Pt a/o x4. CBG was 334, received 400 cc of NS in route.
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
## 2015-03-27 ENCOUNTER — Inpatient Hospital Stay (HOSPITAL_COMMUNITY): Payer: Self-pay

## 2015-03-27 DIAGNOSIS — R509 Fever, unspecified: Secondary | ICD-10-CM

## 2015-03-27 LAB — COMPREHENSIVE METABOLIC PANEL
ALT: 15 U/L — ABNORMAL LOW (ref 17–63)
AST: 27 U/L (ref 15–41)
Albumin: 2.3 g/dL — ABNORMAL LOW (ref 3.5–5.0)
Alkaline Phosphatase: 66 U/L (ref 38–126)
Anion gap: 8 (ref 5–15)
BUN: 17 mg/dL (ref 6–20)
CALCIUM: 7.4 mg/dL — AB (ref 8.9–10.3)
CO2: 22 mmol/L (ref 22–32)
Chloride: 99 mmol/L — ABNORMAL LOW (ref 101–111)
Creatinine, Ser: 1.4 mg/dL — ABNORMAL HIGH (ref 0.61–1.24)
GFR calc Af Amer: >60 mL/min (ref >60)
GFR calc non Af Amer: >60 mL/min (ref >60)
Glucose, Bld: 237 mg/dL — ABNORMAL HIGH (ref 65–99)
Potassium: 4 mmol/L (ref 3.5–5.1)
Sodium: 129 mmol/L — ABNORMAL LOW (ref 135–145)
TOTAL PROTEIN: 6.3 g/dL — AB (ref 6.5–8.1)
Total Bilirubin: 0.4 mg/dL (ref 0.3–1.2)

## 2015-03-27 LAB — CBC
HCT: 33.1 % — ABNORMAL LOW (ref 39.0–52.0)
HEMOGLOBIN: 11.3 g/dL — AB (ref 13.0–17.0)
MCH: 28.6 pg (ref 26.0–34.0)
MCHC: 34.1 g/dL (ref 30.0–36.0)
MCV: 83.8 fL (ref 78.0–100.0)
PLATELETS: 223 10*3/uL (ref 150–400)
RBC: 3.95 MIL/uL — ABNORMAL LOW (ref 4.22–5.81)
RDW: 12.5 % (ref 11.5–15.5)
WBC: 16.4 10*3/uL — ABNORMAL HIGH (ref 4.0–10.5)

## 2015-03-27 LAB — GLUCOSE, CAPILLARY
GLUCOSE-CAPILLARY: 199 mg/dL — AB (ref 65–99)
Glucose-Capillary: 162 mg/dL — ABNORMAL HIGH (ref 65–99)
Glucose-Capillary: 142 mg/dL — ABNORMAL HIGH (ref 65–99)
Glucose-Capillary: 137 mg/dL — ABNORMAL HIGH (ref 65–99)

## 2015-03-27 LAB — INFLUENZA PANEL BY PCR (TYPE A & B)
H1N1 flu by pcr: NOT DETECTED
INFLAPCR: NEGATIVE
INFLBPCR: NEGATIVE

## 2015-03-27 LAB — EXPECTORATED SPUTUM ASSESSMENT W REFEX TO RESP CULTURE: Special Requests: NORMAL

## 2015-03-27 LAB — EXPECTORATED SPUTUM ASSESSMENT W GRAM STAIN, RFLX TO RESP C

## 2015-03-27 LAB — HIV ANTIBODY (ROUTINE TESTING W REFLEX): HIV Screen 4th Generation wRfx: NONREACTIVE

## 2015-03-27 MED ORDER — PNEUMOCOCCAL VAC POLYVALENT 25 MCG/0.5ML IJ INJ: 0.5000 mL | INJECTION | INTRAMUSCULAR | Status: DC

## 2015-03-27 MED ORDER — SODIUM CHLORIDE 0.9 % IV SOLN
INTRAVENOUS | Status: AC
  Administered 2015-03-27 – 2015-03-28 (×3): via INTRAVENOUS

## 2015-03-27 NOTE — Care Management Note (Signed)
Case Management Note  ED CM contacted concerning medication assistance. Patient presented to ED with CP and elevated blood sugar. CM reviewed patient record's he has had 3 ED visits in the past 6 months.  Patient is uninsured.  Met with patient at bedside patient verifies information. Discussed the [facility] program and the guideline including $3 copay per prescription, patient agreeable to terms. Patient Enrolled in the [facility] program letter printed and given to patient with list of pharmacies that participate. Patient also instructed to follow up at the [facility] walk-in clinic Tuesday 5/31 to schedule appt to  establish care, information provided for the [facility] clinic. Patient verbalized understanding and appreciation for the assistance. Updated Seymour Bars PA-C on disposition plan, ED evaluation still pending.

## 2015-03-27 NOTE — Progress Notes (Signed)
Subjective: This morning, he reports feeling mildly better from yesterday. He still reports having bilateral hand numbness and headaches.  Objective: Vital signs in last 24 hours: Filed Vitals:   03/27/15 0340 03/27/15 0400 03/27/15 0600 03/27/15 0744  BP: 100/55 117/70 113/78 143/80  Pulse: 100 92 87 94  Temp: 101.3 F (38.5 C)  100.5 F (38.1 C) 100.8 F (38.2 C)  TempSrc: Rectal  Rectal Rectal  Resp: 23 25 23 22   Height:      Weight:      SpO2: 96% 93% 97% 99%   Weight change:   Intake/Output Summary (Last 24 hours) at 03/27/15 0848 Last data filed at 03/27/15 0746  Gross per 24 hour  Intake   3488 ml  Output   1300 ml  Net   2188 ml   General: Young African-American male, resting in bed, covered in blankets and bear hugger HEENT: PERRL, EOMI, no scleral icterus, oropharynx clear, no lymphadenopathy Cardiac: Tachycardic, no rubs, murmurs or gallops Pulm: Bronchial breath sounds over right upper lobe, no wheezes, rales, or rhonchi Abd: soft, nontender, nondistended, BS present Ext: warm and well perfused, multiple tattoos noted, no pedal or tibial edema, right foot with healing wound without drainage or necrosis Neuro: Responds to questions appropriately, moving all extremities spontaneously, no nuchal rigidity  Lab Results: Basic Metabolic Panel:  Recent Labs Lab 03/26/15 1236 03/27/15 0342  NA 128* 129*  K 4.3 4.0  CL 92* 99*  CO2 24 22  GLUCOSE 344* 237*  BUN 15 17  CREATININE 1.58* 1.40*  CALCIUM 8.3* 7.4*   Liver Function Tests:  Recent Labs Lab 03/27/15 0342  AST 27  ALT 15*  ALKPHOS 66  BILITOT 0.4  PROT 6.3*  ALBUMIN 2.3*   CBC:  Recent Labs Lab 03/26/15 1236 03/27/15 0342  WBC 20.2* 16.4*  NEUTROABS 16.9*  --   HGB 13.0 11.3*  HCT 37.2* 33.1*  MCV 84.4 83.8  PLT 233 223   CBG:  Recent Labs Lab 03/26/15 1237 03/26/15 1611 03/26/15 1825 03/26/15 2236 03/27/15 0746  GLUCAP 319* 304* 230* 231* 199*     Micro  Results: Recent Results (from the past 240 hour(s))  MRSA PCR Screening     Status: None   Collection Time: 03/26/15  6:03 PM  Result Value Ref Range Status   MRSA by PCR NEGATIVE NEGATIVE Final    Comment:        The GeneXpert MRSA Assay (FDA approved for NASAL specimens only), is one component of a comprehensive MRSA colonization surveillance program. It is not intended to diagnose MRSA infection nor to guide or monitor treatment for MRSA infections.   Culture, expectorated sputum-assessment     Status: None   Collection Time: 03/27/15  1:04 AM  Result Value Ref Range Status   Specimen Description SPUTUM  Final   Special Requests Normal  Final   Sputum evaluation   Final    MICROSCOPIC FINDINGS SUGGEST THAT THIS SPECIMEN IS NOT REPRESENTATIVE OF LOWER RESPIRATORY SECRETIONS. PLEASE RECOLLECT. CALLED TO J.TOOMES,RN 0225 03/27/15 M.CAMPBELL    Report Status 03/27/2015 FINAL  Final    Medications: I have reviewed the patient's current medications. Scheduled Meds: . azithromycin  500 mg Intravenous Q24H  . cefTRIAXone (ROCEPHIN)  IV  1 g Intravenous Q24H  . enoxaparin (LOVENOX) injection  40 mg Subcutaneous Q24H  . insulin aspart  0-5 Units Subcutaneous QHS  . insulin aspart  0-9 Units Subcutaneous TID WC  . sodium chloride  3  mL Intravenous Q12H   Continuous Infusions: . sodium chloride 150 mL/hr at 03/27/15 0800   PRN Meds:.acetaminophen **OR** acetaminophen, ondansetron **OR** ondansetron (ZOFRAN) IV Assessment/Plan:  Mr. Gayton is a 39 year old male with poorly controlled type 2 diabetes complicated by right foot wound which is improving who presented with sepsis secondary to atypical pneumonia now with improving acute kidney injury.  Sepsis secondary to atypical pneumonia: Legionella suspect given the hyponatremia, risk factors, physical exam findings. Urine strep antigen was negative. If he does have a neurologic process to account for his neurologic symptoms, viral  meningitis would be favored over bacterial meningitis given the time course of symptoms though still appears less likely without nuchal rigidity and mental status changes. Neurologic symptoms could also be secondary to diabetic neuropathy though he denies similar symptoms in his feet. Leukocytosis is improved this morning though he did have an overnight temperature 105 Fahrenheit.  -Follow-up urine Legionella -Follow-up blood cultures x 2  -Follow-up sputum Gram stain, culture -Follow-up flu PCR, HIV -Continue azithromycin & ceftriaxone per pharmacy [antibiotics day 2] -Continue droplet precautions -Continue neurochecks q2h and consider LP should his mental status decompensate -Recheck BMET & CBC tomorrow morning -Continue Zofran 4mg  as needed for nausea -Continue Tylenol 650mg  every 6 hours as needed for pain/fever -Continue normal saline at 150 mL/hour 24 hours  Hyponatremia: Sodium 129 this morning, though 131-132 when corrected for hyperglycemia which is mostly stable from yesterday. Likely prerenal in the setting of poor oral intake. -BMET as noted above  Acute kidney injury: Creatinine 1.4 this morning, improved from 1.6 on admission. In 2012, 0.87 was last recorded value without values in this time interval to establish a clear baseline. -BMET, IV fluids as noted above  Poorly controlled type 2 diabetes: Glucose since admission has been trending mostly 200s to 300s though he has poor oral intake. No prior A1c in the system. -Continue SSI-S -Check CBG before meals and bedtime -Check A1c   Dispo: Disposition is deferred at this time, awaiting improvement of current medical problems.   The patient does not have a current PCP (No Pcp Per Patient) and does not need an [facility] hospital follow-up appointment after discharge.  The patient does not know have transportation limitations that hinder transportation to clinic appointments.  .Services Needed at time of discharge: Y = Yes, Blank =  No PT:   OT:   RN:   Equipment:   Other:     LOS: 1 day   Riccardo Dubin, MD 03/27/2015, 8:48 AM

## 2015-03-28 DIAGNOSIS — G43909 Migraine, unspecified, not intractable, without status migrainosus: Secondary | ICD-10-CM

## 2015-03-28 DIAGNOSIS — A481 Legionnaires' disease: Secondary | ICD-10-CM | POA: Diagnosis present

## 2015-03-28 LAB — CBC
HEMATOCRIT: 32.9 % — AB (ref 39.0–52.0)
Hemoglobin: 11.4 g/dL — ABNORMAL LOW (ref 13.0–17.0)
MCH: 29.1 pg (ref 26.0–34.0)
MCHC: 34.7 g/dL (ref 30.0–36.0)
MCV: 83.9 fL (ref 78.0–100.0)
Platelets: 228 10*3/uL (ref 150–400)
RBC: 3.92 MIL/uL — AB (ref 4.22–5.81)
RDW: 12.8 % (ref 11.5–15.5)
WBC: 11.3 10*3/uL — AB (ref 4.0–10.5)

## 2015-03-28 LAB — URINALYSIS, ROUTINE W REFLEX MICROSCOPIC
BILIRUBIN URINE: NEGATIVE
Glucose, UA: 250 mg/dL — AB
Ketones, ur: 15 mg/dL — AB
Leukocytes, UA: NEGATIVE
Nitrite: NEGATIVE
PH: 6 (ref 5.0–8.0)
Protein, ur: 100 mg/dL — AB
SPECIFIC GRAVITY, URINE: 1.016 (ref 1.005–1.030)
UROBILINOGEN UA: 1 mg/dL (ref 0.0–1.0)

## 2015-03-28 LAB — URINE MICROSCOPIC-ADD ON

## 2015-03-28 LAB — GLUCOSE, CAPILLARY
GLUCOSE-CAPILLARY: 116 mg/dL — AB (ref 65–99)
GLUCOSE-CAPILLARY: 179 mg/dL — AB (ref 65–99)
GLUCOSE-CAPILLARY: 195 mg/dL — AB (ref 65–99)
Glucose-Capillary: 201 mg/dL — ABNORMAL HIGH (ref 65–99)

## 2015-03-28 LAB — BASIC METABOLIC PANEL
ANION GAP: 6 (ref 5–15)
BUN: 11 mg/dL (ref 6–20)
CALCIUM: 7.5 mg/dL — AB (ref 8.9–10.3)
CHLORIDE: 100 mmol/L — AB (ref 101–111)
CO2: 25 mmol/L (ref 22–32)
Creatinine, Ser: 1.22 mg/dL (ref 0.61–1.24)
GFR calc Af Amer: >60 mL/min (ref >60)
GFR calc non Af Amer: >60 mL/min (ref >60)
Glucose, Bld: 123 mg/dL — ABNORMAL HIGH (ref 65–99)
POTASSIUM: 4.2 mmol/L (ref 3.5–5.1)
Sodium: 131 mmol/L — ABNORMAL LOW (ref 135–145)

## 2015-03-28 LAB — SODIUM, URINE, RANDOM: SODIUM UR: 137 mmol/L

## 2015-03-28 LAB — OSMOLALITY, URINE: Osmolality, Ur: 582 mOsm/kg (ref 390–1090)

## 2015-03-28 LAB — RPR: RPR Ser Ql: NONREACTIVE

## 2015-03-28 MED ORDER — KETOROLAC TROMETHAMINE 30 MG/ML IJ SOLN
30.0000 mg | Freq: Once | INTRAMUSCULAR | Status: AC
  Administered 2015-03-28: 30 mg via INTRAVENOUS
  Filled 2015-03-28: qty 1

## 2015-03-28 MED ORDER — SODIUM CHLORIDE 0.9 % IV SOLN
INTRAVENOUS | Status: AC
  Administered 2015-03-28: 16:00:00 via INTRAVENOUS

## 2015-03-28 MED ORDER — GUAIFENESIN-CODEINE 100-10 MG/5ML PO SOLN
5.0000 mL | ORAL | Status: DC | PRN
  Administered 2015-03-28 – 2015-03-30 (×2): 5 mL via ORAL
  Filled 2015-03-28 (×2): qty 5

## 2015-03-28 MED ORDER — TRAMADOL HCL 50 MG PO TABS
50.0000 mg | ORAL_TABLET | Freq: Once | ORAL | Status: AC
Start: 2015-03-28 — End: 2015-03-28
  Administered 2015-03-28: 50 mg via ORAL
  Filled 2015-03-28: qty 1

## 2015-03-28 MED ORDER — KETOROLAC TROMETHAMINE 30 MG/ML IJ SOLN
30.0000 mg | Freq: Three times a day (TID) | INTRAMUSCULAR | Status: DC | PRN
Start: 2015-03-28 — End: 2015-03-28

## 2015-03-28 MED ORDER — KETOROLAC TROMETHAMINE 30 MG/ML IJ SOLN
30.0000 mg | Freq: Three times a day (TID) | INTRAMUSCULAR | Status: DC | PRN
Start: 2015-03-28 — End: 2015-03-29
  Administered 2015-03-28 – 2015-03-29 (×2): 30 mg via INTRAVENOUS
  Filled 2015-03-28 (×2): qty 1

## 2015-03-28 MED ORDER — SODIUM CHLORIDE 0.9 % IV SOLN
INTRAVENOUS | Status: DC
  Administered 2015-03-28: 23:00:00 via INTRAVENOUS

## 2015-03-28 NOTE — Care Management Note (Signed)
Case Management Note  Patient Details  Name: Derrick Mosley MRN: [id number] Date of Birth: Nov 12, 1975  Subjective/Objective:       Adm w pneumonia           Action/Plan:from grp home   Expected Discharge Date:                  Expected Discharge Plan:  Group Home  In-House Referral:  Clinical Social Work  Discharge planning Services  [place] [facility]  Post Acute Care Choice:    Choice offered to:     DME Arranged:    DME Agency:     HH Arranged:    [place] Agency:     Status of Service:     Medicare Important Message Given:    Date Medicare IM Given:    Medicare IM give by:    Date Additional Medicare IM Given:    Additional Medicare Important Message give by:     If discussed at [place] of Stay Meetings, dates discussed:    Additional Comments:ur review done  Lacretia Leigh, RN 03/28/2015, 11:32 AM

## 2015-03-28 NOTE — Progress Notes (Signed)
Subjective: This morning, he reported that his headaches have not improved along with his light sensitivity those other symptoms, like chest pain and feeling short of breath, has certainly gotten better. We explained that his lab work and downtrending fever or evidence that things were certainly improving, and the current antibiotics that he is receiving are appropriate therapy. He did report to assist morning that he has had migraines as a child though never really had them since. He was later given Toradol 30 mg IV and reported marked improvement in his headache though persistent photophobia.  Objective: Vital signs in last 24 hours: Filed Vitals:   03/28/15 0500 03/28/15 0711 03/28/15 0800 03/28/15 1200  BP: 105/62  139/92 119/65  Pulse: 90 82 86 85  Temp: 100.9 F (38.3 C)  100.6 F (38.1 C) 100.1 F (37.8 C)  TempSrc: Rectal  Rectal Rectal  Resp: 25 24 28 21   Height:      Weight:      SpO2: 92% 95% 97% 95%   Weight change:   Intake/Output Summary (Last 24 hours) at 03/28/15 1509 Last data filed at 03/28/15 1343  Gross per 24 hour  Intake   4815 ml  Output   2400 ml  Net   2415 ml   General: Young African-American male, resting in bed, covered in blankets and bear hugger HEENT: PERRL, EOMI, no scleral icterus, oropharynx clear, no lymphadenopathy Cardiac: Tachycardic, no rubs, murmurs or gallops Pulm: Bronchial breath sounds over right upper lobe, no wheezes, rales, or rhonchi Abd: soft, nontender, nondistended, BS present Ext: warm and well perfused, multiple tattoos noted, no pedal or tibial edema, feet covered and socks Neuro: Responds to questions appropriately, moving all extremities spontaneously  Lab Results: Basic Metabolic Panel:  Recent Labs Lab 03/27/15 0342 03/28/15 0250  NA 129* 131*  K 4.0 4.2  CL 99* 100*  CO2 22 25  GLUCOSE 237* 123*  BUN 17 11  CREATININE 1.40* 1.22  CALCIUM 7.4* 7.5*   Liver Function Tests:  Recent Labs Lab  03/27/15 0342  AST 27  ALT 15*  ALKPHOS 66  BILITOT 0.4  PROT 6.3*  ALBUMIN 2.3*   CBC:  Recent Labs Lab 03/26/15 1236 03/27/15 0342 03/28/15 0250  WBC 20.2* 16.4* 11.3*  NEUTROABS 16.9*  --   --   HGB 13.0 11.3* 11.4*  HCT 37.2* 33.1* 32.9*  MCV 84.4 83.8 83.9  PLT 233 223 228   CBG:  Recent Labs Lab 03/27/15 0746 03/27/15 1244 03/27/15 1725 03/27/15 2225 03/28/15 0854 03/28/15 1326  GLUCAP 199* 162* 142* 137* 116* 179*     Micro Results: Recent Results (from the past 240 hour(s))  MRSA PCR Screening     Status: None   Collection Time: 03/26/15  6:03 PM  Result Value Ref Range Status   MRSA by PCR NEGATIVE NEGATIVE Final    Comment:        The GeneXpert MRSA Assay (FDA approved for NASAL specimens only), is one component of a comprehensive MRSA colonization surveillance program. It is not intended to diagnose MRSA infection nor to guide or monitor treatment for MRSA infections.   Culture, blood (routine x 2)     Status: None (Preliminary result)   Collection Time: 03/26/15  7:48 PM  Result Value Ref Range Status   Specimen Description BLOOD RIGHT ANTECUBITAL  Final   Special Requests BOTTLES DRAWN AEROBIC AND ANAEROBIC 10CC  Final   Culture   Final  BLOOD CULTURE RECEIVED NO GROWTH TO DATE CULTURE WILL BE HELD FOR 5 DAYS BEFORE ISSUING A FINAL NEGATIVE REPORT Note: Culture results may be compromised due to an excessive volume of blood received in culture bottles. Performed at [facility]    Report Status PENDING  Incomplete  Culture, blood (routine x 2)     Status: None (Preliminary result)   Collection Time: 03/26/15  7:58 PM  Result Value Ref Range Status   Specimen Description BLOOD RIGHT HAND  Final   Special Requests BOTTLES DRAWN AEROBIC AND ANAEROBIC 10CC  Final   Culture   Final           BLOOD CULTURE RECEIVED NO GROWTH TO DATE CULTURE WILL BE HELD FOR 5 DAYS BEFORE ISSUING A FINAL NEGATIVE REPORT Note: Culture  results may be compromised due to an excessive volume of blood received in culture bottles. Performed at [facility]    Report Status PENDING  Incomplete  Culture, expectorated sputum-assessment     Status: None   Collection Time: 03/27/15  1:04 AM  Result Value Ref Range Status   Specimen Description SPUTUM  Final   Special Requests Normal  Final   Sputum evaluation   Final    MICROSCOPIC FINDINGS SUGGEST THAT THIS SPECIMEN IS NOT REPRESENTATIVE OF LOWER RESPIRATORY SECRETIONS. PLEASE RECOLLECT. CALLED TO J.TOOMES,RN 0225 03/27/15 M.CAMPBELL    Report Status 03/27/2015 FINAL  Final    Medications: I have reviewed the patient's current medications. Scheduled Meds: . azithromycin  500 mg Intravenous Q24H  . cefTRIAXone (ROCEPHIN)  IV  1 g Intravenous Q24H  . enoxaparin (LOVENOX) injection  40 mg Subcutaneous Q24H  . insulin aspart  0-5 Units Subcutaneous QHS  . insulin aspart  0-9 Units Subcutaneous TID WC  . sodium chloride  3 mL Intravenous Q12H   Continuous Infusions: . sodium chloride 150 mL/hr at 03/28/15 1031   PRN Meds:.acetaminophen **OR** acetaminophen, ketorolac, ondansetron **OR** ondansetron (ZOFRAN) IV Assessment/Plan:  Mr. Crismon is a 39 year old male with poorly controlled type 2 diabetes complicated by right foot wound which is improving who presented with sepsis secondary to atypical pneumonia now with persistent hyponatremia and possibly migraine.  Sepsis secondary to atypical pneumonia: Legionella suspect given the hyponatremia, risk factors, physical exam findings. If he does have a neurologic process to account for his neurologic symptoms, viral meningitis would be favored over bacterial meningitis given the time course of symptoms though still appears less likely without fluctuating mental status. HIV, flu negative. Leukocytosis continues to improve this morning with overnight temperature trending 100-101 Fahrenheit.  -Follow-up urine  Legionella -Follow-up blood cultures x 2  -Follow-up sputum Gram stain, culture -Continue azithromycin & ceftriaxone per pharmacy [antibiotics day 3] -Recheck BMET & CBC tomorrow morning -Continue Zofran 4mg  as needed for nausea -Continue Tylenol 650mg  every 6 hours as needed for pain/fever -Continue normal saline at 150 mL/hour 24 hours  Migraine: He does report a prior history though does not appear significant. Current episode may have been triggered by acute illness. Photophobia persistent though no photophobia. HIV negative which makes opportunistic infection less likely. -Continue Toradol 30 mg IV every 8 hours as needed for headache -Consider brain imaging should his headaches not improve -Follow-up RPR   Hyponatremia: Sodium 131 this morning, mostly stable from 131-132 when corrected for height but her glycemia since admission. Likely prerenal in the setting of poor oral intake though unsure why it has persisted. -BMET as noted above -Check urine osmolality, sodium, UA  Acute  kidney injury: Resolving. Creatinine 1.2 this morning, improved from 1.6 on admission. In 2012, 0.87 was last recorded value without values in this time interval to establish a clear baseline. -BMET, IV fluids as noted above  Poorly controlled type 2 diabetes: Glucose trended mostly in the 100s since yesterday though he has poor oral intake. No prior A1c in the system. -Continue SSI-S -Check CBG before meals and bedtime -Follow-up A1c   Dispo: Disposition is deferred at this time, awaiting improvement of current medical problems.   The patient does not have a current PCP (No Pcp Per Patient) and does not need an [facility] hospital follow-up appointment after discharge.  The patient does not know have transportation limitations that hinder transportation to clinic appointments.  .Services Needed at time of discharge: Y = Yes, Blank = No PT:   OT:   RN:   Equipment:   Other:     LOS: 2 days   Derrick Dubin, MD 03/28/2015, 3:09 PM

## 2015-03-28 NOTE — Progress Notes (Signed)
Called by RN that he tested positive for Legionella. Per conversations with pharmacy, he received 750 mg Levaquin IV on admission and thus does not need 1 g azithromycin IV right now. We will continue him at azithromycin 500 mg IV and discontinue ceftriaxone.

## 2015-03-29 DIAGNOSIS — N179 Acute kidney failure, unspecified: Secondary | ICD-10-CM

## 2015-03-29 DIAGNOSIS — A481 Legionnaires' disease: Secondary | ICD-10-CM

## 2015-03-29 DIAGNOSIS — R208 Other disturbances of skin sensation: Secondary | ICD-10-CM

## 2015-03-29 DIAGNOSIS — E1165 Type 2 diabetes mellitus with hyperglycemia: Secondary | ICD-10-CM

## 2015-03-29 DIAGNOSIS — R319 Hematuria, unspecified: Secondary | ICD-10-CM | POA: Diagnosis present

## 2015-03-29 DIAGNOSIS — G43909 Migraine, unspecified, not intractable, without status migrainosus: Secondary | ICD-10-CM | POA: Diagnosis present

## 2015-03-29 DIAGNOSIS — R202 Paresthesia of skin: Secondary | ICD-10-CM

## 2015-03-29 DIAGNOSIS — R509 Fever, unspecified: Secondary | ICD-10-CM

## 2015-03-29 DIAGNOSIS — E871 Hypo-osmolality and hyponatremia: Secondary | ICD-10-CM

## 2015-03-29 DIAGNOSIS — R51 Headache: Secondary | ICD-10-CM

## 2015-03-29 HISTORY — DX: Paresthesia of skin: R20.2

## 2015-03-29 LAB — CBC
HEMATOCRIT: 31.2 % — AB (ref 39.0–52.0)
Hemoglobin: 10.7 g/dL — ABNORMAL LOW (ref 13.0–17.0)
MCH: 28.6 pg (ref 26.0–34.0)
MCHC: 34.3 g/dL (ref 30.0–36.0)
MCV: 83.4 fL (ref 78.0–100.0)
Platelets: 204 10*3/uL (ref 150–400)
RBC: 3.74 MIL/uL — ABNORMAL LOW (ref 4.22–5.81)
RDW: 12.8 % (ref 11.5–15.5)
WBC: 9.5 10*3/uL (ref 4.0–10.5)

## 2015-03-29 LAB — BASIC METABOLIC PANEL
Anion gap: 9 (ref 5–15)
BUN: 11 mg/dL (ref 6–20)
CALCIUM: 6.9 mg/dL — AB (ref 8.9–10.3)
CO2: 20 mmol/L — ABNORMAL LOW (ref 22–32)
Chloride: 102 mmol/L (ref 101–111)
Creatinine, Ser: 1.04 mg/dL (ref 0.61–1.24)
GFR calc Af Amer: >60 mL/min (ref >60)
Glucose, Bld: 150 mg/dL — ABNORMAL HIGH (ref 65–99)
Potassium: 4.1 mmol/L (ref 3.5–5.1)
Sodium: 131 mmol/L — ABNORMAL LOW (ref 135–145)

## 2015-03-29 LAB — GLUCOSE, CAPILLARY
GLUCOSE-CAPILLARY: 170 mg/dL — AB (ref 65–99)
GLUCOSE-CAPILLARY: 236 mg/dL — AB (ref 65–99)
GLUCOSE-CAPILLARY: 231 mg/dL — AB (ref 65–99)
Glucose-Capillary: 141 mg/dL — ABNORMAL HIGH (ref 65–99)

## 2015-03-29 LAB — HEMOGLOBIN A1C
Hgb A1c MFr Bld: 11.6 % — ABNORMAL HIGH (ref 4.8–5.6)
Mean Plasma Glucose: 286 mg/dL

## 2015-03-29 LAB — LEGIONELLA ANTIGEN, URINE

## 2015-03-29 MED ORDER — KETOROLAC TROMETHAMINE 30 MG/ML IJ SOLN
30.0000 mg | Freq: Once | INTRAMUSCULAR | Status: AC
  Administered 2015-03-29: 30 mg via INTRAVENOUS
  Filled 2015-03-29: qty 1

## 2015-03-29 MED ORDER — DIPHENHYDRAMINE HCL 50 MG/ML IJ SOLN
12.5000 mg | Freq: Once | INTRAMUSCULAR | Status: AC
  Administered 2015-03-29: 12.5 mg via INTRAVENOUS
  Filled 2015-03-29: qty 1

## 2015-03-29 MED ORDER — METOCLOPRAMIDE HCL 5 MG/ML IJ SOLN
10.0000 mg | Freq: Once | INTRAMUSCULAR | Status: AC
  Administered 2015-03-29: 10 mg via INTRAVENOUS
  Filled 2015-03-29: qty 2

## 2015-03-29 NOTE — Progress Notes (Signed)
Rounding Note:   Spoke to Patient about A1c level 11.6% and spoke with patient about diabetes and home regimen for diabetes control. Patient reports that he takes Metformin and Glipizide at home twice a day. Discussed basic home care, importance of checking CBGs and maintaining good CBG control to prevent long-term and short-term complications. Patient reports checking his glucose levels once a day in the mornings. Discussed impact of nutrition, exercise, stress, sickness, and medications on diabetes control.  Patient reports that he does not follow a diabetic diet and drinks regular fruit punch. I spoke with him about finding a diet, light, or zero version of a beverage that he likes and drink that. I also gave him diabetic meal planning guide with specific serving sizes for specific foods. Discussed carbohydrates, carbohydrate goals per day and meal, along with portion sizes. Patient verbalized understanding of information discussed and he states that he has no further questions at this time related to diabetes.   MD: Due to A1c level unsure if he needs to be on some type of insulin with him having no insurance, [facility] long acting may be an option for him.

## 2015-03-29 NOTE — Progress Notes (Signed)
PT Cancellation Note  Patient Details Name: Derrick Mosley MRN: [id number] DOB: 1976/02/13   Cancelled Treatment:    Reason Eval/Treat Not Completed: Medical issues which prohibited therapy (Pt with fever.  Nursing asked to HOLD PT.  Will return tomorrow.)   Irwin Brakeman F 03/29/2015, 10:05 AM  [facility] [phone] (pager)

## 2015-03-29 NOTE — Discharge Summary (Signed)
Name: Derrick Mosley MRN: [id number] DOB: Dec 13, 1975 39 y.o. PCP: Gaylord Shih, MD  Date of Admission: 03/26/2015 12:06 PM Date of Discharge: 03/31/2015 Attending Physician: No att. providers found  Discharge Diagnosis:  Sepsis secondary to Legionella pneumonia Migraine Bilateral hand paresthesias Hyponatremia Acute kidney injury Poorly controlled type 2 diabetes  Discharge Medications:   Medication List    STOP taking these medications        clindamycin 300 MG capsule  Commonly known as:  CLEOCIN     glipiZIDE 5 MG tablet  Commonly known as:  GLUCOTROL      TAKE these medications        azithromycin 500 MG tablet  Commonly known as:  ZITHROMAX  Take 1 tablet (500 mg total) by mouth daily.     blood glucose meter kit and supplies Kit  Dispense based on patient and insurance preference. Use up to four times daily as directed. (FOR ICD-9 250.00, 250.01).     freestyle lancets  Use as instructed     glucose blood test strip  Use as instructed     guaiFENesin-codeine 100-10 MG/5ML syrup  Take 5 mLs by mouth every 4 (four) hours as needed for cough.     insulin aspart protamine- aspart (70-30) 100 UNIT/ML injection  Commonly known as:  NOVOLOG MIX 70/30  Inject 0.08 mLs (8 Units total) into the skin 2 (two) times daily with a meal.     Insulin Syringe-Needle U-100 29G 0.3 ML Misc  Commonly known as:  RELI-ON INS SYR .3CC/29G  8 Units by Does not apply route 2 (two) times daily with a meal.     metFORMIN 1000 MG tablet  Commonly known as:  GLUCOPHAGE  Take 1 tablet (1,000 mg total) by mouth 2 (two) times daily with a meal.     metoCLOPramide 10 MG tablet  Commonly known as:  REGLAN  Take 1 tablet (10 mg total) by mouth every 8 (eight) hours as needed (headache).     silver sulfADIAZINE 1 % cream  Commonly known as:  SILVADENE  Apply 1 application topically daily.        Disposition and follow-up:   [name] was discharged from [facility] in Stable condition.  At the hospital follow up visit please address:  Follow-up proteinuria: check urine microalbumin/crt ratio. Start Ace I if >30. Check BMET for resolution of hyponatremia. Assess completion of abx azithromycin (until 04/04/15).  Assess Resolution of headache (may consider neuro referral if persisting)  Assess glycemic control - discharged on metformin + insulin 70/30 8 units BID.  Repeat CXR in 4 weeks to ensure resolution of PNA.  Follow-up Appointments: Follow-up Information    Follow up with Dow Adolph, MD. Schedule an appointment as soon as possible for a visit on 04/06/2015.   Specialty:  Internal Medicine   Why:  9:15 am   Contact information:   [address] [place] [address] [phone]       Discharge Instructions:   Consultations:    Procedures Performed:  Dg Chest 2 View  03/26/2015   CLINICAL DATA:  Cough and congestion with dizziness. Right rib pain, right lower chest pain with possible fever, headache, body aches and light sensitivity.  EXAM: CHEST  2 VIEW  COMPARISON:  None.  FINDINGS: Lungs are adequately inflated with opacification over the right upper lobe likely a pneumonia. No evidence of effusion. Cardiomediastinal silhouette is within normal. Remaining bones soft tissues are  unremarkable.  IMPRESSION: Right upper lobe consolidation likely a pneumonia. Recommend followup chest radiograph in 4 weeks to document resolution.   Electronically Signed   By: Marin Olp M.D.   On: 03/26/2015 13:10   Dg Chest Port 1 View  03/27/2015   CLINICAL DATA:  Right upper lobe pneumonia.  EXAM: PORTABLE CHEST - 1 VIEW  COMPARISON:  03/26/2015  FINDINGS: Lungs are somewhat hypoinflated with interval worsening consolidation over the right upper lobe. Cardiomediastinal silhouette and remainder of the exam is unchanged.  IMPRESSION: Worsening consolidation over the right upper lobe compatible with a pneumonia.   Electronically Signed    By: Marin Olp M.D.   On: 03/27/2015 09:34    Admission HPI: Mr. Derrick Mosley is a 39 year old male with poorly controlled diabetes who presents with 2 day history of headache, chest pain, generalized aches.  Two days ago, he reported the onset of bitemporal headache associated with sensitivity to light and bilateral hand numbness that limits his ability to write our text on his cell phone. Yesterday, he reported the onset of generalized body aches and sharp chest pain worse with inspiration localized to his right side. Of note, he was released from imprisonment in March and has been in a halfway house since the denies any sick contacts or recent sexual activity. He also tested negative for TB and HIV when last checked in March. He denies any abdominal pain, vomiting, diarrhea, weight loss, night sweats, prior history of STI's. He also smokes half a pack per day since 1990 though denies recent alcohol or illicit drug use. He is currently seeking employment with [employer].  He was recently seen in urgent care earlier this month and was found to have a right diabetic foot ulcer. He was given ceftriaxone 1 g IM and prescribed clindamycin 300 mg 3 times daily along with wound care instructions. He was assessed to be improving without signs of osteomyelitis over 2 follow-up visits.   In the ED, he was found to have fever with temperature 103.5 Fahrenheit. CXR was notable for RUL infiltrate, and he was given Levaquin 750 mg and 1 L normal saline bolus.  Hospital Course by problem list:  Mr. Derrick Mosley is a 39 year old male with poorly controlled type 2 diabetes complicated by right foot wound which is improving who presented with sepsis secondary to Legionella pneumonia with persistent headache.   Sepsis secondary to Legionella pneumonia - Legionella suspect given the hyponatremia, risk factors, physical exam findings and confirmed yesterday with urinary antigen test. Leukocytosis resolved. Afebrile for 48  hours, overall fever curve has trended down since admission (Tmax was 105.4). Blood cultures with no growth to date -treated for legionella PNA with azithromycin 500 mg (initially IV) then switched to PO on discharge, finish course on 04/04/15.  Headache with light sensitivity - with questionable hx of Migraine: He does report a prior history of headache when he was younger. Current episode may have been triggered by acute illness/cough. Photophobia persistent though no phonophobia. HIV negative which makes opportunistic infection less likely. RPR negative.  - improved with headache cocktail. Will discharge with reglan for 7 days.  - May consider outpatient neurology follow up if headache persists, including brain imaging.  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 - b12/tsh normal. Reassess outpatient.  Hyponatremia: Sodium stable around 130-131, likely attributed to Legionella infection. Urine osmolality is appropriate though sodium appears to be  as slightly more than expected. - will need to be followed outpatient for resolution.  Acute kidney injury: Resolving. ~crt 1.0 improved from 1.6 on admission. In 2012, 0.87 was last recorded value. UA yesterday was notable for proteinuria, likely 2/2 to diabetes.  - May benefit from Ace Inhibitor outpatient if urine microalbumin ratio >30.  Poorly controlled type 2 diabetes: A1c 11.6 without any prior to compare. Glucose trended mostly in the high 100s since yesterday though he has poor oral intake.  - Continue SSI-S. Was on metformin and glipizide outpatient. May benefit from Insulin (was on insulin in the past but was taken off since a1c has been under control per patient) - will send him out on metformin $RemoveBefo'1000mg'ztbjuppTcIw$  BID + insulin 70/30 8 units BID (0.5 u/kg/day) Follow up and reassess outpatient.  Discharge Vitals:   BP 120/75 mmHg   Pulse 71  Temp(Src) 97.8 F (36.6 C) (Oral)  Resp 18  Ht $R'5\' 11"'yz$  (1.803 m)  Wt 237 lb 8 oz (107.729 kg)  BMI 33.14 kg/m2  SpO2 98%  Discharge Labs:  No results found for this or any previous visit (from the past 24 hour(s)).   CBC Latest Ref Rng 03/30/2015 03/30/2015 03/29/2015  WBC 4.0 - 10.5 K/uL 8.6 - 9.5  Hemoglobin 13.0 - 17.0 g/dL 10.4(L) - 10.7(L)  Hematocrit 37.5 - 51.0 % 30.3(L) 30.7(L) 31.2(L)  Platelets 150 - 400 K/uL 248 - 204    BMP Latest Ref Rng 03/30/2015 03/29/2015 03/28/2015  Glucose 65 - 99 mg/dL 219(H) 150(H) 123(H)  BUN 6 - 20 mg/dL $Remove'10 11 11  'POgHWJg$ Creatinine 0.61 - 1.24 mg/dL 1.08 1.04 1.22  Sodium 135 - 145 mmol/L 130(L) 131(L) 131(L)  Potassium 3.5 - 5.1 mmol/L 4.1 4.1 4.2  Chloride 101 - 111 mmol/L 100(L) 102 100(L)  CO2 22 - 32 mmol/L 23 20(L) 25  Calcium 8.9 - 10.3 mg/dL 7.4(L) 6.9(L) 7.5(L)      Signed: Dellia Nims, MD 03/31/2015, 2:29 PM    Services Ordered on Discharge:  Equipment Ordered on Discharge:

## 2015-03-29 NOTE — Progress Notes (Signed)
Subjective: This morning, he reported that his headaches improved with Toradol that was given yesterday though he feels his headache returns. We have spoken with his employer and he is aware that he is currently in the hospital recovering from an infection.  Objective: Vital signs in last 24 hours: Filed Vitals:   03/29/15 1217 03/29/15 1221 03/29/15 1326 03/29/15 1701  BP: 133/85  120/72 120/65  Pulse: 74 71 72 92  Temp: 99.6 F (37.6 C) 99.6 F (37.6 C) 99 F (37.2 C) 99.5 F (37.5 C)  TempSrc: Rectal Rectal Oral Oral  Resp: 22 14 17 17   Height:      Weight:      SpO2: 98% 99% 100% 100%   Weight change:   Intake/Output Summary (Last 24 hours) at 03/29/15 1926 Last data filed at 03/29/15 1743  Gross per 24 hour  Intake   3100 ml  Output   1075 ml  Net   2025 ml   General: Young African-American male, resting in bed, covered in blankets and bear hugger HEENT: PERRL, EOMI, no scleral icterus, oropharynx clear, no lymphadenopathy Cardiac: Tachycardic, no rubs, murmurs or gallops Pulm: Bronchial breath sounds alongside right lung fields without no wheezes, rales, or rhonchi Abd: soft, nontender, nondistended, BS present Ext: warm and well perfused, multiple tattoos noted, no pedal or tibial edema, feet covered with socks Neuro: Responds to questions appropriately, moving all extremities spontaneously  Lab Results: Basic Metabolic Panel:  Recent Labs Lab 03/28/15 0250 03/29/15 0430  NA 131* 131*  K 4.2 4.1  CL 100* 102  CO2 25 20*  GLUCOSE 123* 150*  BUN 11 11  CREATININE 1.22 1.04  CALCIUM 7.5* 6.9*   Liver Function Tests:  Recent Labs Lab 03/27/15 0342  AST 27  ALT 15*  ALKPHOS 66  BILITOT 0.4  PROT 6.3*  ALBUMIN 2.3*   CBC:  Recent Labs Lab 03/26/15 1236  03/28/15 0250 03/29/15 0430  WBC 20.2*  < > 11.3* 9.5  NEUTROABS 16.9*  --   --   --   HGB 13.0  < > 11.4* 10.7*  HCT 37.2*  < > 32.9* 31.2*  MCV 84.4  < > 83.9 83.4  PLT 233  < > 228  204  < > = values in this interval not displayed. CBG:  Recent Labs Lab 03/28/15 1326 03/28/15 1628 03/28/15 2306 03/29/15 0905 03/29/15 1311 03/29/15 1630  GLUCAP 179* 195* 201* 141* 170* 236*     Micro Results: Recent Results (from the past 240 hour(s))  MRSA PCR Screening     Status: None   Collection Time: 03/26/15  6:03 PM  Result Value Ref Range Status   MRSA by PCR NEGATIVE NEGATIVE Final    Comment:        The GeneXpert MRSA Assay (FDA approved for NASAL specimens only), is one component of a comprehensive MRSA colonization surveillance program. It is not intended to diagnose MRSA infection nor to guide or monitor treatment for MRSA infections.   Culture, blood (routine x 2)     Status: None (Preliminary result)   Collection Time: 03/26/15  7:48 PM  Result Value Ref Range Status   Specimen Description BLOOD RIGHT ANTECUBITAL  Final   Special Requests BOTTLES DRAWN AEROBIC AND ANAEROBIC 10CC  Final   Culture   Final           BLOOD CULTURE RECEIVED NO GROWTH TO DATE CULTURE WILL BE HELD FOR 5 DAYS BEFORE ISSUING A FINAL NEGATIVE REPORT  Note: Culture results may be compromised due to an excessive volume of blood received in culture bottles. Performed at [facility]    Report Status PENDING  Incomplete  Culture, blood (routine x 2)     Status: None (Preliminary result)   Collection Time: 03/26/15  7:58 PM  Result Value Ref Range Status   Specimen Description BLOOD RIGHT HAND  Final   Special Requests BOTTLES DRAWN AEROBIC AND ANAEROBIC 10CC  Final   Culture   Final           BLOOD CULTURE RECEIVED NO GROWTH TO DATE CULTURE WILL BE HELD FOR 5 DAYS BEFORE ISSUING A FINAL NEGATIVE REPORT Note: Culture results may be compromised due to an excessive volume of blood received in culture bottles. Performed at [facility]    Report Status PENDING  Incomplete  Culture, expectorated sputum-assessment     Status: None   Collection Time: 03/27/15   1:04 AM  Result Value Ref Range Status   Specimen Description SPUTUM  Final   Special Requests Normal  Final   Sputum evaluation   Final    MICROSCOPIC FINDINGS SUGGEST THAT THIS SPECIMEN IS NOT REPRESENTATIVE OF LOWER RESPIRATORY SECRETIONS. PLEASE RECOLLECT. CALLED TO J.TOOMES,RN 0225 03/27/15 M.CAMPBELL    Report Status 03/27/2015 FINAL  Final    Medications: I have reviewed the patient's current medications. Scheduled Meds: . azithromycin  500 mg Intravenous Q24H  . enoxaparin (LOVENOX) injection  40 mg Subcutaneous Q24H  . insulin aspart  0-5 Units Subcutaneous QHS  . insulin aspart  0-9 Units Subcutaneous TID WC  . sodium chloride  3 mL Intravenous Q12H   Continuous Infusions:   PRN Meds:.acetaminophen **OR** acetaminophen, guaiFENesin-codeine, ondansetron **OR** ondansetron (ZOFRAN) IV Assessment/Plan:  Mr. Redington is a 39 year old male with poorly controlled type 2 diabetes complicated by right foot wound which is improving who presented with sepsis secondary to Legionella pneumonia with persistent migraine and bilateral hand paresthesias.  Sepsis secondary to Legionella pneumonia: Legionella suspect given the hyponatremia, risk factors, physical exam findings and confirmed yesterday with urinary antigen test. Leukocytosis continues to improve this morning with overnight temperature trending 100-101 Fahrenheit. Blood cultures with no growth to date 3 days. -Continue azithromycin 500 mg IV [antibiotics day 4/10] -Recheck BMET & CBC tomorrow morning -Continue Zofran 4mg  as needed for nausea -Continue Tylenol 650mg  every 6 hours as needed for pain/fever -Continue normal saline at 100 mL/hour 24 hours as he continues to have poor oral intake  Migraine: He does report a prior history though does not appear significant. Current episode may have been triggered by acute illness. Photophobia persistent though no phonophobia. HIV negative which makes opportunistic infection less  likely. RPR negative. -Continue Toradol 30 mg IV with Reglan 10 mg IV and Benadryl 25 mg IV -Consider brain imaging should his headaches not improve  Bilateral hand paresthesias: Likely neuropathy in the setting of poorly controlled diabetes though unclear why he does not have similar symptoms in his feet. -Check B12/folate/TSH to exclude other causes  Hyponatremia: Sodium 131 this morning, mostly stable, likely attributed to Legionella infection. Urine osmolality is appropriate though sodium appears to be as slightly more than expected. -BMET as noted above  Acute kidney injury: Resolving. Creatinine 1.0 this morning, improved from 1.6 on admission. In 2012, 0.87 was last recorded value without values in this time interval to establish a clear baseline. UA yesterday was notable for proteinuria. -BMET, IV fluids as noted above  Poorly controlled type 2 diabetes: A1c  11.6 without any prior to compare. Glucose trended mostly in the 100s since yesterday though he has poor oral intake.  -Continue SSI-S -Check CBG before meals and bedtime  Dispo: Disposition is deferred at this time, awaiting improvement of current medical problems.   The patient does not have a current PCP (No Pcp Per Patient) and does not need an [facility] hospital follow-up appointment after discharge.  The patient does not know have transportation limitations that hinder transportation to clinic appointments.  .Services Needed at time of discharge: Y = Yes, Blank = No PT:   OT:   RN:   Equipment:   Other:     LOS: 3 days   Derrick Dubin, MD 03/29/2015, 7:26 PM

## 2015-03-29 NOTE — Progress Notes (Signed)
Patient was supposed to be given Toradol, reglan and Benadryl for his H/A, but patient wants them to be given later.  Attending doctor is aware.

## 2015-03-29 NOTE — Progress Notes (Signed)
Admission note:  Arrival Method: Patient arrived to unit in bed with staff accompanying Mental Orientation: Alert and oriented x 4. Telemetry: 6E-06, NSR Assessment: See doc flow sheets. Skin: Warm, dry and intact, except for lateral right diabetic foot ulcer.  Changed the foam dressing.  He also has a mole on the left leg. IV: Left AC intact and infusing NS 100 ml/hr.  Safety Measures: Bed in low position, phone and call light within reach. Admission Screening: In progress. 6700 Orientation: Patient has been oriented to the unit, staff and to the room.

## 2015-03-30 LAB — CBC
HCT: 30.3 % — ABNORMAL LOW (ref 39.0–52.0)
Hemoglobin: 10.4 g/dL — ABNORMAL LOW (ref 13.0–17.0)
MCH: 28.3 pg (ref 26.0–34.0)
MCHC: 34.3 g/dL (ref 30.0–36.0)
MCV: 82.3 fL (ref 78.0–100.0)
Platelets: 248 10*3/uL (ref 150–400)
RBC: 3.68 MIL/uL — ABNORMAL LOW (ref 4.22–5.81)
RDW: 12.8 % (ref 11.5–15.5)
WBC: 8.6 10*3/uL (ref 4.0–10.5)

## 2015-03-30 LAB — VITAMIN B12: VITAMIN B 12: 403 pg/mL (ref 180–914)

## 2015-03-30 LAB — BASIC METABOLIC PANEL
ANION GAP: 7 (ref 5–15)
BUN: 10 mg/dL (ref 6–20)
CO2: 23 mmol/L (ref 22–32)
CREATININE: 1.08 mg/dL (ref 0.61–1.24)
Calcium: 7.4 mg/dL — ABNORMAL LOW (ref 8.9–10.3)
Chloride: 100 mmol/L — ABNORMAL LOW (ref 101–111)
GFR calc Af Amer: >60 mL/min (ref >60)
GLUCOSE: 219 mg/dL — AB (ref 65–99)
Potassium: 4.1 mmol/L (ref 3.5–5.1)
SODIUM: 130 mmol/L — AB (ref 135–145)

## 2015-03-30 LAB — GLUCOSE, CAPILLARY
GLUCOSE-CAPILLARY: 281 mg/dL — AB (ref 65–99)
Glucose-Capillary: 184 mg/dL — ABNORMAL HIGH (ref 65–99)

## 2015-03-30 LAB — TSH: TSH: 1.029 u[IU]/mL (ref 0.350–4.500)

## 2015-03-30 MED ORDER — INSULIN ASPART PROT & ASPART (70-30 MIX) 100 UNIT/ML ~~LOC~~ SUSP: 8.0000 [IU] | Freq: Two times a day (BID) | SUBCUTANEOUS | Status: DC

## 2015-03-30 MED ORDER — INSULIN SYRINGE-NEEDLE U-100 29G 0.3 ML MISC: 8.0000 [IU] | Freq: Two times a day (BID) | Status: DC

## 2015-03-30 MED ORDER — GLUCOSE BLOOD VI STRP: ORAL_STRIP | Status: DC

## 2015-03-30 MED ORDER — BLOOD GLUCOSE MONITOR KIT: PACK | Status: DC

## 2015-03-30 MED ORDER — METOCLOPRAMIDE HCL 5 MG/ML IJ SOLN
10.0000 mg | Freq: Once | INTRAMUSCULAR | Status: AC
  Administered 2015-03-30: 10 mg via INTRAVENOUS
  Filled 2015-03-30: qty 2

## 2015-03-30 MED ORDER — METOCLOPRAMIDE HCL 10 MG PO TABS
10.0000 mg | ORAL_TABLET | Freq: Three times a day (TID) | ORAL | Status: DC | PRN
Start: 2015-03-30 — End: 2015-04-06

## 2015-03-30 MED ORDER — GUAIFENESIN-CODEINE 100-10 MG/5ML PO SOLN: 5.0000 mL | ORAL | Status: DC | PRN

## 2015-03-30 MED ORDER — METFORMIN HCL 1000 MG PO TABS: 1000.0000 mg | ORAL_TABLET | Freq: Two times a day (BID) | ORAL | Status: DC

## 2015-03-30 MED ORDER — FREESTYLE LANCETS MISC: Status: DC

## 2015-03-30 MED ORDER — DIPHENHYDRAMINE HCL 50 MG/ML IJ SOLN
12.5000 mg | Freq: Once | INTRAMUSCULAR | Status: AC
  Administered 2015-03-30: 12.5 mg via INTRAVENOUS
  Filled 2015-03-30: qty 1

## 2015-03-30 MED ORDER — KETOROLAC TROMETHAMINE 30 MG/ML IJ SOLN
30.0000 mg | Freq: Once | INTRAMUSCULAR | Status: AC
  Administered 2015-03-30: 30 mg via INTRAVENOUS
  Filled 2015-03-30: qty 1

## 2015-03-30 MED ORDER — AZITHROMYCIN 500 MG PO TABS: 500.0000 mg | ORAL_TABLET | Freq: Every day | ORAL | Status: AC

## 2015-03-30 NOTE — Clinical Social Work Note (Signed)
CSW received consult regarding patient admitted from facility. Visited with Mr. Dicicco and was informed that he is currently in a federal half-way house [employer] off of [employer].  [facility] and will be discharged on 04/12/15. During the conversation patient brought up his hospital bill and thinks that since he is in the custody of the [facility]) that they should be paying his hospital bill, as he has no insurance. Patient advised that CSW will f/u with financial counselor regarding this. Call made to A. Gwen Pounds, Development worker, community and message left regarding patient's question.  Karlei Waldo Givens, MSW, LCSW Licensed Clinical Social Worker [place] [phone]

## 2015-03-30 NOTE — Plan of Care (Signed)
Problem: Discharge Progression Outcomes Goal: Independent ADLs or Home Health Care Independent ADLs

## 2015-03-30 NOTE — Progress Notes (Addendum)
Inpatient Diabetes Program Recommendations  AACE/ADA: New Consensus Statement on Inpatient Glycemic Control (2013)  Target Ranges:  Prepandial:   less than 140 mg/dL      Peak postprandial:   less than 180 mg/dL (1-2 hours)      Critically ill patients:  140 - 180 mg/dL    Results for EAGLE, KILDUFF (MRN [id number]) as of 03/30/2015 10:37  Ref. Range 03/27/2015 12:30  Hemoglobin A1C Latest Ref Range: 4.8-5.6 % 11.6 (H)    Results for DALANTE, LEBSACK (MRN [id number]) as of 03/30/2015 10:37  Ref. Range 03/29/2015 09:05 03/29/2015 13:11 03/29/2015 16:30 03/29/2015 21:28  Glucose-Capillary Latest Ref Range: 65-99 mg/dL 141 (H) 170 (H) 236 (H) 231 (H)    Home DM Meds: Glipizide 5 mg bid       Metformin 500 mg bid  Current DM Orders: Novolog Sensitive SSI tid ac + HS    **Note A1c severely elevated.  May need insulin for home.  **Note that patient does not have insurance.  Will need inexpensive insulin if decision made to send patient home on insulin.    **Patient can purchase Reli-on brand of 70/30 insulin at [facility] for $25 per vial.  **Note DM Coordinator had lengthy conversation with patient yesterday about elevated A1c and importance of good glucose control.    MD- If you send patient home on insulin, please consider starting 70/30 insulin.  70/30 insulin- 8 units bidwc (this would be approximately 0.1 units/kg basal dosing)   Will follow Wyn Quaker RN, MSN, CDE Diabetes Coordinator Inpatient Diabetes Program Team Pager: [phone] (8a-5p)

## 2015-03-30 NOTE — Progress Notes (Signed)
Patient c/o 7/10 throbbing, aching H/A. Notified on call. Awaiting response.

## 2015-03-30 NOTE — Progress Notes (Signed)
Subjective: Febrile last yesterday morning. Overall fever curve down from admission. Continues to have headache, improved with headache reglan and toradol. Cough causes the headache, has hx of headache as a child but this is new. No neck stiffness or other neuro changes.  Objective: Vital signs in last 24 hours: Filed Vitals:   03/29/15 1701 03/29/15 2132 03/30/15 0040 03/30/15 0455  BP: 120/65 127/62  125/69  Pulse: 92 85  79  Temp: 99.5 F (37.5 C) 100.3 F (37.9 C) 99.1 F (37.3 C) 99.4 F (37.4 C)  TempSrc: Oral Oral Oral Oral  Resp: 17 16  18   Height:      Weight:  237 lb 8 oz (107.729 kg)    SpO2: 100% 98%  97%   Weight change:   Intake/Output Summary (Last 24 hours) at 03/30/15 0947 Last data filed at 03/30/15 0600  Gross per 24 hour  Intake    840 ml  Output    550 ml  Net    290 ml   General: resting in bed, no NAD HEENT: PERRL, EOMI, no scleral icterus, oropharynx clear, no lymphadenopathy Cardiac: normal rate, no rubs, murmurs or gallops Pulm: normal BS, without no wheezes, rales, or rhonchi Abd: soft, nontender, nondistended, BS present Ext: warm and well perfused, no edema. Neuro: Responds to questions appropriately, moving all extremities spontaneously  Lab Results: Basic Metabolic Panel:  Recent Labs Lab 03/29/15 0430 03/30/15 0616  NA 131* 130*  K 4.1 4.1  CL 102 100*  CO2 20* 23  GLUCOSE 150* 219*  BUN 11 10  CREATININE 1.04 1.08  CALCIUM 6.9* 7.4*   Liver Function Tests:  Recent Labs Lab 03/27/15 0342  AST 27  ALT 15*  ALKPHOS 66  BILITOT 0.4  PROT 6.3*  ALBUMIN 2.3*   CBC:  Recent Labs Lab 03/26/15 1236  03/29/15 0430 03/30/15 0616  WBC 20.2*  < > 9.5 8.6  NEUTROABS 16.9*  --   --   --   HGB 13.0  < > 10.7* 10.4*  HCT 37.2*  < > 31.2* 30.3*  MCV 84.4  < > 83.4 82.3  PLT 233  < > 204 248  < > = values in this interval not displayed. CBG:  Recent Labs Lab 03/28/15 2306 03/29/15 0905 03/29/15 1311 03/29/15 1630  03/29/15 2128 03/30/15 0742  GLUCAP 201* 141* 170* 236* 231* 184*     Micro Results: Recent Results (from the past 240 hour(s))  MRSA PCR Screening     Status: None   Collection Time: 03/26/15  6:03 PM  Result Value Ref Range Status   MRSA by PCR NEGATIVE NEGATIVE Final    Comment:        The GeneXpert MRSA Assay (FDA approved for NASAL specimens only), is one component of a comprehensive MRSA colonization surveillance program. It is not intended to diagnose MRSA infection nor to guide or monitor treatment for MRSA infections.   Culture, blood (routine x 2)     Status: None (Preliminary result)   Collection Time: 03/26/15  7:48 PM  Result Value Ref Range Status   Specimen Description BLOOD RIGHT ANTECUBITAL  Final   Special Requests BOTTLES DRAWN AEROBIC AND ANAEROBIC 10CC  Final   Culture   Final           BLOOD CULTURE RECEIVED NO GROWTH TO DATE CULTURE WILL BE HELD FOR 5 DAYS BEFORE ISSUING A FINAL NEGATIVE REPORT Note: Culture results may be compromised due to an excessive volume of  blood received in culture bottles. Performed at [facility]    Report Status PENDING  Incomplete  Culture, blood (routine x 2)     Status: None (Preliminary result)   Collection Time: 03/26/15  7:58 PM  Result Value Ref Range Status   Specimen Description BLOOD RIGHT HAND  Final   Special Requests BOTTLES DRAWN AEROBIC AND ANAEROBIC 10CC  Final   Culture   Final           BLOOD CULTURE RECEIVED NO GROWTH TO DATE CULTURE WILL BE HELD FOR 5 DAYS BEFORE ISSUING A FINAL NEGATIVE REPORT Note: Culture results may be compromised due to an excessive volume of blood received in culture bottles. Performed at [facility]    Report Status PENDING  Incomplete  Culture, expectorated sputum-assessment     Status: None   Collection Time: 03/27/15  1:04 AM  Result Value Ref Range Status   Specimen Description SPUTUM  Final   Special Requests Normal  Final   Sputum evaluation   Final     MICROSCOPIC FINDINGS SUGGEST THAT THIS SPECIMEN IS NOT REPRESENTATIVE OF LOWER RESPIRATORY SECRETIONS. PLEASE RECOLLECT. CALLED TO J.TOOMES,RN 0225 03/27/15 M.CAMPBELL    Report Status 03/27/2015 FINAL  Final    Medications: I have reviewed the patient's current medications. Scheduled Meds: . azithromycin  500 mg Intravenous Q24H  . enoxaparin (LOVENOX) injection  40 mg Subcutaneous Q24H  . insulin aspart  0-5 Units Subcutaneous QHS  . insulin aspart  0-9 Units Subcutaneous TID WC  . sodium chloride  3 mL Intravenous Q12H   Continuous Infusions:   PRN Meds:.acetaminophen **OR** acetaminophen, guaiFENesin-codeine, ondansetron **OR** ondansetron (ZOFRAN) IV Assessment/Plan:  Mr. Derrick Mosley is a 39 year old male with poorly controlled type 2 diabetes complicated by right foot wound which is improving who presented with sepsis secondary to Legionella pneumonia with persistent headache.   Sepsis secondary to Legionella pneumonia - improving: Legionella suspect given the hyponatremia, risk factors, physical exam findings and confirmed yesterday with urinary antigen test. Leukocytosis resolved. Afebrile since yesterday morning, overall fever curve has trended down since admission (Tmax was 105.4). Blood cultures with no growth to date 3 days. -Continue azithromycin 500 mg [antibiotics day 5/10], switch to PO today. Complete 04/04/15. -Continue Tylenol 650mg  every 6 hours as needed for pain/fever  Headache with light sensitivity - with questionable hx of Migraine: He does report a prior history of headache when he was younger. Current episode may have been triggered by acute illness/cough. Photophobia persistent though no phonophobia. HIV negative which makes opportunistic infection less likely. RPR negative.  - improved with headache cocktail. Will discharge with reglan for 7 days. -  May consider outpatient neurology follow up if headache persists, including brain imaging.  Bilateral hand  paresthesias: Likely neuropathy in the setting of poorly controlled diabetes though unclear why he does not have similar symptoms in his feet. - b12/tsh normal.   Hyponatremia: Sodium stable around 130-131, likely attributed to Legionella infection. Urine osmolality is appropriate though sodium appears to be as slightly more than expected. - will need to be followed outpatient for resolution.  Acute kidney injury: Resolving. ~crt 1.0 improved from 1.6 on admission. In 2012, 0.87 was last recorded value. UA yesterday was notable for proteinuria, likely 2/2 to diabetes.  - May benefit from Ace Inhibitor outpatient  Poorly controlled type 2 diabetes: A1c 11.6 without any prior to compare. Glucose trended mostly in the high 100s since yesterday though he has poor oral intake.  - Continue  SSI-S. Was on metformin and glipizide outpatient. May benefit from Insulin (was on insulin in the past but was taken off since a1c has been under control per patient) - will send him out on metformin 1000mg  BID + insulin 70/30 8 units BID (0.5 u/kg/day) Follow up and reassess outpatient.  Dispo: Disposition is deferred at this time, awaiting improvement of current medical problems.   The patient does not have a current PCP (No Pcp Per Patient) and does not need an [facility] hospital follow-up appointment after discharge.  The patient does not know have transportation limitations that hinder transportation to clinic appointments.  .Services Needed at time of discharge: Y = Yes, Blank = No PT:   OT:   RN:   Equipment:   Other:     LOS: 4 days   Dellia Nims, MD 03/30/2015, 9:47 AM

## 2015-03-30 NOTE — Discharge Instructions (Addendum)
Please start taking Insulin 70/30 8 units twice a day.  Measure your blood sugar 3 times a day. Take your meter to your clinic appointment.  Finish taking your antibiotics (to be completed on 04/04/15).  Take Reglan as needed for headache (don't take it for more than 7 days).

## 2015-03-30 NOTE — Evaluation (Signed)
Occupational Therapy Evaluation Patient Details Name: Derrick Mosley MRN: [id number] DOB: 14-Apr-1976 Today's Date: 03/30/2015    History of Present Illness Mr. Greenwalt is a 39 year old male with poorly controlled type 2 diabetes complicated by right foot wound which is improving who presented with sepsis secondary to Legionella pneumonia with persistent migraine and bilateral hand paresthesias.   Clinical Impression   Pt is performing ADL and ADL transfers at a modified independent to independent level.  No further OT needs.    Follow Up Recommendations  No OT follow up    Equipment Recommendations  None recommended by OT    Recommendations for Other Services       Precautions / Restrictions Precautions Precautions: None Restrictions Weight Bearing Restrictions: No      Mobility Bed Mobility Overal bed mobility: Modified Independent             General bed mobility comments: HOB up  Transfers Overall transfer level: Independent                    Balance                                            ADL Overall ADL's : Modified independent                                       General ADL Comments: Pt able to perform ADL and functional fine motor tasks somewhat slowly, but independently.     Vision     Perception     Praxis      Pertinent Vitals/Pain Pain Assessment: Faces Faces Pain Scale: Hurts even more Pain Location: back Pain Descriptors / Indicators: Guarding Pain Intervention(s): Monitored during session     Hand Dominance Left   Extremity/Trunk Assessment Upper Extremity Assessment Upper Extremity Assessment: Overall WFL for tasks assessed (numbness/tingling from wrists distal)   Lower Extremity Assessment Lower Extremity Assessment: Defer to PT evaluation       Communication Communication Communication: No difficulties   Cognition Arousal/Alertness: Awake/alert Behavior During Therapy:  WFL for tasks assessed/performed Overall Cognitive Status: Within Functional Limits for tasks assessed                     General Comments       Exercises       Shoulder Instructions      Home Living Family/patient expects to be discharged to:: [facility] home (half way house) Living Arrangements: Group Home   Type of Home: House Home Access: Stairs to enter     Home Layout: Two level Alternate Level Stairs-Number of Steps: 1 flight   Bathroom Shower/Tub: Occupational psychologist: Standard     Home Equipment: None          Prior Functioning/Environment Level of Independence: Independent        Comments: 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    OT Diagnosis:     OT Problem List:     OT Treatment/Interventions:      OT Goals(Current goals can be found in the care plan section) Acute Rehab OT Goals Patient Stated Goal: return to work  OT Frequency:     Barriers to  D/C:            Co-evaluation              End of Session    Activity Tolerance: Patient tolerated treatment well Patient left:  (ambulating with PT)   Time: 1335-1350 OT Time Calculation (min): 15 min Charges:  OT General Charges $OT Visit: 1 Procedure OT Evaluation $Initial OT Evaluation Tier I: 1 Procedure G-Codes:    Malka So 03/30/2015, 2:02 PM  [phone]

## 2015-03-30 NOTE — Care Management Note (Addendum)
Case Management Note  Patient Details  Name: Derrick Mosley MRN: [id number] Date of Birth: 08-14-1976  Subjective/Objective:              CM following for progression and d/c planning.      Action/Plan: 03/30/2015 Met with pt to discuss d/c needs. Per chart this pt was given a MATCH letter in the ED to assist with prescriptions at the time of d/c. This CM discussed with pt who states that his friend has the letter. This CM asked the pt to inform me if his friend does not have the letter asap prior to d/c. We can provide another copy if the letter has not been used, however we can not enter into the system again as this is only available once per year. I have instructed the pt to let this CM know if his friend has lost the [facility] letter. Also discussed with pt RN.  03/30/15 4pm MATCH letter recopied and given to pt with prescriptions. This was explained to the pt and his RN.  Appointment was made for this pt at [place] perhaps in the ED, therefore this was cancelled as the pt is being following in the [facility].   CRoyal RN MPH, case manager, [phone] Expected Discharge Date:        03/30/2015          Expected Discharge Plan:  Home/Self Care (halfway house per chart)  In-House Referral:  Clinical Social Work  Discharge planning Services  [facility] Program  Post Acute Care Choice:    Choice offered to:     DME Arranged:    DME Agency:     HH Arranged:    HH Agency:     Status of Service:     Medicare Important Message Given:   No Date Medicare IM Given:    Medicare IM give by:    Date Additional Medicare IM Given:    Additional Medicare Important Message give by:     If discussed at [place] of Stay Meetings, dates discussed:    Additional Comments: Per attending MD, this pt will be followed in the Internal Medicine Clinic, this CM explained to pt that he may qualify for an [facility] and will receive assist in obtaining this card at the  clinic.  CRoyal RN MPH Sevyn Markham, Rory Percy, RN 03/30/2015, 11:25 AM

## 2015-03-30 NOTE — Evaluation (Signed)
Physical Therapy Evaluation Patient Details Name: RAYMEN TONDRE MRN: [id number] DOB: 03/06/1976 Today's Date: 03/30/2015   History of Present Illness  Mr. Coday is a 39 year old male with poorly controlled type 2 diabetes complicated by right foot wound which is improving who presented with sepsis secondary to Legionella pneumonia with persistent migraine and bilateral hand paresthesias.  Clinical Impression  Pt admitted with above diagnosis. Pt currently with functional limitations due to the deficits listed below (see PT Problem List).  Pt will benefit from skilled PT to increase their independence and safety with mobility to allow discharge to the venue listed below.       Follow Up Recommendations No PT follow up (perhaps Outpatient PT for back pain)    Equipment Recommendations  None recommended by PT    Recommendations for Other Services       Precautions / Restrictions Precautions Precautions: None Restrictions Weight Bearing Restrictions: No      Mobility  Bed Mobility Overal bed mobility: Modified Independent             General bed mobility comments: HOB up  Transfers Overall transfer level: Independent                  Ambulation/Gait Ambulation/Gait assistance: Supervision;Modified independent (Device/Increase time) Ambulation Distance (Feet): 200 Feet Assistive device: None;Straight cane Gait Pattern/deviations: Step-through pattern   Gait velocity interpretation: Below normal speed for age/gender General Gait Details: Slow moving and with back stiffness; walked a portion of the distance with the single point cane, which seemed to help as pt reached out fo rUE support less, and his gait smoothed out some; he would ratehr not use a cane  Stairs Stairs: Yes Stairs assistance: Min guard Stair Management: One rail Left;Forwards;Alternating pattern Number of Stairs: 12 General stair comments: slow, cues to self-monitor fo ractivity  tolerance  Wheelchair Mobility    Modified Rankin (Stroke Patients Only)       Balance                                             Pertinent Vitals/Pain Pain Assessment: Faces Faces Pain Scale: Hurts even more Pain Location: back Pain Descriptors / Indicators: Aching;Guarding Pain Intervention(s): Monitored during session    Home Living Family/patient expects to be discharged to:: [place] (half way house) Living Arrangements: Group Home   Type of Home: House Home Access: Stairs to enter     Home Layout: Two level Home Equipment: None      Prior Function Level of Independence: Independent         Comments: 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     Hand Dominance   Dominant Hand: Left    Extremity/Trunk Assessment   Upper Extremity Assessment: Defer to OT evaluation           Lower Extremity Assessment: Overall WFL for tasks assessed      Cervical / Trunk Assessment:  (reported back ache and stiffness)  Communication   Communication: No difficulties  Cognition Arousal/Alertness: Awake/alert Behavior During Therapy: WFL for tasks assessed/performed Overall Cognitive Status: Within Functional Limits for tasks assessed                      General Comments General comments (skin integrity, edema, etc.): Session conducted on room  air, noted O2 sat drop to below 88%, came back up to 95% with focused breathing and standing rest break; Taught pt splinting withcough and incentive spirometry    Exercises        Assessment/Plan    PT Assessment Patient needs continued PT services  PT Diagnosis Difficulty walking;Acute pain   PT Problem List Decreased activity tolerance;Decreased balance;Pain;Cardiopulmonary status limiting activity  PT Treatment Interventions DME instruction;Gait training;Stair training;Functional mobility training;Patient/family education   PT Goals (Current goals  can be found in the Care Plan section) Acute Rehab PT Goals Patient Stated Goal: return to work PT Goal Formulation: With patient Time For Goal Achievement: 04/06/15 Potential to Achieve Goals: Good    Frequency Min 3X/week   Barriers to discharge        Co-evaluation               End of Session   Activity Tolerance: Patient tolerated treatment well Patient left: in bed;with call bell/phone within reach (sitting EOB) Nurse Communication: Mobility status         Time: [id number] PT Time Calculation (min) (ACUTE ONLY): 31 min   Charges:   PT Evaluation $Initial PT Evaluation Tier I: 1 Procedure PT Treatments $Gait Training: 8-22 mins   PT G CodesQuin Hoop 03/30/2015, 5:02 PM  Roney [place], [place] Pager [phone] Office [phone]

## 2015-03-30 NOTE — Progress Notes (Signed)
Patient Discharge: Disposition: Patient discharged today. Education: Reviewed medications, prescriptions, follow-up appointments, and discharge instructions, understood and acknowledged. IV: Discontinued IV before discharge. Telemetry: Discontinued before discharge, CCMD notified. Transportation: patient transported in w/c with the staff accompanying. Belongings: Patient took all his belongings with him.

## 2015-03-31 LAB — FOLATE RBC
FOLATE, HEMOLYSATE: 346.2 ng/mL
FOLATE, RBC: 1128 ng/mL (ref >498)
Hematocrit: 30.7 % — ABNORMAL LOW (ref 37.5–51.0)

## 2015-03-31 LAB — HEPATITIS C ANTIBODY (REFLEX): HCV Ab: NEGATIVE

## 2015-03-31 LAB — HCV COMMENT:

## 2015-04-01 ENCOUNTER — Inpatient Hospital Stay: Payer: Self-pay

## 2015-04-02 LAB — CULTURE, BLOOD (ROUTINE X 2)
CULTURE: NO GROWTH
Culture: NO GROWTH

## 2015-04-05 ENCOUNTER — Telehealth: Payer: Self-pay | Admitting: Internal Medicine

## 2015-04-05 NOTE — Telephone Encounter (Signed)
Call to patient to confirm appointment for 04/06/15 at 9:15 no answer

## 2015-04-06 ENCOUNTER — Encounter: Payer: Self-pay | Admitting: Internal Medicine

## 2015-04-06 ENCOUNTER — Ambulatory Visit (INDEPENDENT_AMBULATORY_CARE_PROVIDER_SITE_OTHER): Payer: Self-pay | Admitting: Internal Medicine

## 2015-04-06 ENCOUNTER — Other Ambulatory Visit: Payer: Self-pay | Admitting: Dietician

## 2015-04-06 VITALS — BP 113/69 | HR 91 | Temp 97.0°F | Wt 212.0 lb

## 2015-04-06 DIAGNOSIS — A481 Legionnaires' disease: Secondary | ICD-10-CM

## 2015-04-06 DIAGNOSIS — G43909 Migraine, unspecified, not intractable, without status migrainosus: Secondary | ICD-10-CM

## 2015-04-06 DIAGNOSIS — R319 Hematuria, unspecified: Secondary | ICD-10-CM

## 2015-04-06 DIAGNOSIS — E1165 Type 2 diabetes mellitus with hyperglycemia: Secondary | ICD-10-CM

## 2015-04-06 DIAGNOSIS — E1140 Type 2 diabetes mellitus with diabetic neuropathy, unspecified: Secondary | ICD-10-CM

## 2015-04-06 DIAGNOSIS — L97509 Non-pressure chronic ulcer of other part of unspecified foot with unspecified severity: Secondary | ICD-10-CM

## 2015-04-06 DIAGNOSIS — R202 Paresthesia of skin: Secondary | ICD-10-CM

## 2015-04-06 DIAGNOSIS — Z794 Long term (current) use of insulin: Secondary | ICD-10-CM

## 2015-04-06 DIAGNOSIS — L97519 Non-pressure chronic ulcer of other part of right foot with unspecified severity: Secondary | ICD-10-CM

## 2015-04-06 DIAGNOSIS — L97511 Non-pressure chronic ulcer of other part of right foot limited to breakdown of skin: Secondary | ICD-10-CM

## 2015-04-06 DIAGNOSIS — E871 Hypo-osmolality and hyponatremia: Secondary | ICD-10-CM

## 2015-04-06 DIAGNOSIS — G43009 Migraine without aura, not intractable, without status migrainosus: Secondary | ICD-10-CM

## 2015-04-06 DIAGNOSIS — E11621 Type 2 diabetes mellitus with foot ulcer: Secondary | ICD-10-CM

## 2015-04-06 DIAGNOSIS — F17200 Nicotine dependence, unspecified, uncomplicated: Secondary | ICD-10-CM | POA: Insufficient documentation

## 2015-04-06 DIAGNOSIS — R209 Unspecified disturbances of skin sensation: Secondary | ICD-10-CM

## 2015-04-06 LAB — BASIC METABOLIC PANEL WITH GFR
BUN: 19 mg/dL (ref 6–23)
CHLORIDE: 99 meq/L (ref 96–112)
CO2: 25 meq/L (ref 19–32)
CREATININE: 1.25 mg/dL (ref 0.50–1.35)
Calcium: 9.5 mg/dL (ref 8.4–10.5)
GFR, Est African American: 84 mL/min
GFR, Est Non African American: 73 mL/min
Glucose, Bld: 208 mg/dL — ABNORMAL HIGH (ref 70–99)
POTASSIUM: 5.8 meq/L — AB (ref 3.5–5.3)
Sodium: 135 mEq/L (ref 135–145)

## 2015-04-06 LAB — LIPID PANEL
Cholesterol: 193 mg/dL (ref 0–200)
HDL: 27 mg/dL — ABNORMAL LOW (ref >=40)
LDL Cholesterol: 132 mg/dL — ABNORMAL HIGH (ref 0–99)
Total CHOL/HDL Ratio: 7.1 Ratio
Triglycerides: 171 mg/dL — ABNORMAL HIGH (ref <150)
VLDL: 34 mg/dL (ref 0–40)

## 2015-04-06 LAB — HM DIABETES EYE EXAM

## 2015-04-06 MED ORDER — TOPIRAMATE 25 MG PO TABS
25.0000 mg | ORAL_TABLET | Freq: Two times a day (BID) | ORAL | Status: DC
Start: 2015-04-06 — End: 2018-01-31

## 2015-04-06 MED ORDER — INSULIN ASPART PROT & ASPART (70-30 MIX) 100 UNIT/ML ~~LOC~~ SUSP: 10.0000 [IU] | Freq: Two times a day (BID) | SUBCUTANEOUS | Status: DC

## 2015-04-06 MED ORDER — METOCLOPRAMIDE HCL 10 MG PO TABS: 10.0000 mg | ORAL_TABLET | Freq: Three times a day (TID) | ORAL | Status: DC | PRN

## 2015-04-06 NOTE — Assessment & Plan Note (Signed)
Likely patient is suffering from peripheral neuropathy due to uncontrolled diabetes. Another differential is complication of his migraine headaches. He may benefit from Gabapentin if symptoms persist after resolution of his migraine.

## 2015-04-06 NOTE — Assessment & Plan Note (Signed)
Lab Results  Component Value Date   HGBA1C 11.6* 03/27/2015     Assessment: Diabetes control: poor control (HgbA1C >9%) Progress toward A1C goal:  unable to assess Comments: On Meftormin 1000 mg bid and Insulin 70/30 10 units bid. FBS 130-200. He has not glucometer today.   Plan: Medications:  Increase insulin to 10 units bid Home glucose monitoring: Frequency: 3 times a day Timing: before breakfast, before lunch, before dinner Instruction/counseling given: reminded to get eye exam and reminded to bring blood glucose meter & log to each visit Educational resources provided: brochure, handout Self management tools provided:   Other plans:  Complete eye exam  Lipid panel  Urine microalbumin  Follow up in 2-3 weeks

## 2015-04-06 NOTE — Patient Instructions (Addendum)
General Instructions: Please start taking Topamax 25 mg once daily (in evening) for one week. If headache has not improved, you may increase to 25 mg twice a day for another week. If still no improvement after 2nd week, you can take up to 50 mg twice a day.  Please continue to take Metocopramide 10 mg every 8 hrs as needed for headache. This medication is intended for short term use (not more than 6 weeks total) Please increase Insulin to 10 units twice a day Please be sure to see our financial counselor. We can make a referral to foot doctor once you get orange card  Please follow up in 2-3 weeks  Please bring your medicines with you each time you come to clinic.  Medicines may include prescription medications, over-the-counter medications, herbal remedies, eye drops, vitamins, or other pills.   Progress Toward Treatment Goals:  Treatment Goal 04/06/2015  Hemoglobin A1C unable to assess    Self Care Goals & Plans:  Self Care Goal 04/06/2015  Manage my medications take my medicines as prescribed; bring my medications to every visit; refill my medications on time; follow the sick day instructions if I am sick  Monitor my health keep track of my blood glucose; keep track of my weight; check my feet daily  Eat healthy foods eat more vegetables; eat fruit for snacks and desserts; eat baked foods instead of fried foods; eat smaller portions; drink diet soda or water instead of juice or soda  Be physically active find an activity I enjoy    Home Blood Glucose Monitoring 04/06/2015  Check my blood sugar 3 times a day  When to check my blood sugar before breakfast; before lunch; before dinner     Care Management & Community Referrals:  Referral 04/06/2015  Referrals made for care management support diabetes educator     Topiramate tablets What is this medicine? TOPIRAMATE (toe PYRE a mate) is used to treat seizures in adults or children with epilepsy. It is also used for the prevention of  migraine headaches. This medicine may be used for other purposes; ask your health care provider or pharmacist if you have questions. COMMON BRAND NAME(S): Topamax, Topiragen What should I tell my health care provider before I take this medicine? They need to know if you have any of these conditions: -bleeding disorders -cirrhosis of the liver or liver disease -diarrhea -glaucoma -kidney stones or kidney disease -low blood counts, like low white cell, platelet, or red cell counts -lung disease like asthma, obstructive pulmonary disease, emphysema -metabolic acidosis -on a ketogenic diet -schedule for surgery or a procedure -suicidal thoughts, plans, or attempt; a previous suicide attempt by you or a family member -an unusual or allergic reaction to topiramate, other medicines, foods, dyes, or preservatives -pregnant or trying to get pregnant -breast-feeding How should I use this medicine? Take this medicine by mouth with a glass of water. Follow the directions on the prescription label. Do not crush or chew. You may take this medicine with meals. Take your medicine at regular intervals. Do not take it more often than directed. Talk to your pediatrician regarding the use of this medicine in children. Special care may be needed. While this drug may be prescribed for children as young as 90 years of age for selected conditions, precautions do apply. Overdosage: If you think you have taken too much of this medicine contact a poison control center or emergency room at once. NOTE: This medicine is only for you. Do not  share this medicine with others. What if I miss a dose? If you miss a dose, take it as soon as you can. If your next dose is to be taken in less than 6 hours, then do not take the missed dose. Take the next dose at your regular time. Do not take double or extra doses. What may interact with this medicine? Do not take this medicine with any of the following  medications: -probenecid This medicine may also interact with the following medications: -acetazolamide -alcohol -amitriptyline -aspirin and aspirin-like medicines -birth control pills -certain medicines for depression -certain medicines for seizures -certain medicines that treat or prevent blood clots like warfarin, enoxaparin, dalteparin, apixaban, dabigatran, and rivaroxaban -digoxin -hydrochlorothiazide -lithium -medicines for pain, sleep, or muscle relaxation -metformin -methazolamide -NSAIDS, medicines for pain and inflammation, like ibuprofen or naproxen -pioglitazone -risperidone This list may not describe all possible interactions. Give your health care provider a list of all the medicines, herbs, non-prescription drugs, or dietary supplements you use. Also tell them if you smoke, drink alcohol, or use illegal drugs. Some items may interact with your medicine. What should I watch for while using this medicine? Visit your doctor or health care professional for regular checks on your progress. Do not stop taking this medicine suddenly. This increases the risk of seizures if you are using this medicine to control epilepsy. Wear a medical identification bracelet or chain to say you have epilepsy or seizures, and carry a card that lists all your medicines. This medicine can decrease sweating and increase your body temperature. Watch for signs of deceased sweating or fever, especially in children. Avoid extreme heat, hot baths, and saunas. Be careful about exercising, especially in hot weather. Contact your health care provider right away if you notice a fever or decrease in sweating. You should drink plenty of fluids while taking this medicine. If you have had kidney stones in the past, this will help to reduce your chances of forming kidney stones. If you have stomach pain, with nausea or vomiting and yellowing of your eyes or skin, call your doctor immediately. You may get drowsy,  dizzy, or have blurred vision. Do not drive, use machinery, or do anything that needs mental alertness until you know how this medicine affects you. To reduce dizziness, do not sit or stand up quickly, especially if you are an older patient. Alcohol can increase drowsiness and dizziness. Avoid alcoholic drinks. If you notice blurred vision, eye pain, or other eye problems, seek medical attention at once for an eye exam. The use of this medicine may increase the chance of suicidal thoughts or actions. Pay special attention to how you are responding while on this medicine. Any worsening of mood, or thoughts of suicide or dying should be reported to your health care professional right away. This medicine may increase the chance of developing metabolic acidosis. If left untreated, this can cause kidney stones, bone disease, or slowed growth in children. Symptoms include breathing fast, fatigue, loss of appetite, irregular heartbeat, or loss of consciousness. Call your doctor immediately if you experience any of these side effects. Also, tell your doctor about any surgery you plan on having while taking this medicine since this may increase your risk for metabolic acidosis. Birth control pills may not work properly while you are taking this medicine. Talk to your doctor about using an extra method of birth control. Women who become pregnant while using this medicine may enroll in the [place] Pregnancy Registry by  calling [phone]. This registry collects information about the safety of antiepileptic drug use during pregnancy. What side effects may I notice from receiving this medicine? Side effects that you should report to your doctor or health care professional as soon as possible: -allergic reactions like skin rash, itching or hives, swelling of the face, lips, or tongue -decreased sweating and/or rise in body temperature -depression -difficulty breathing, fast or irregular  breathing patterns -difficulty speaking -difficulty walking or controlling muscle movements -hearing impairment -redness, blistering, peeling or loosening of the skin, including inside the mouth -tingling, pain or numbness in the hands or feet -unusual bleeding or bruising -unusually weak or tired -worsening of mood, thoughts or actions of suicide or dying Side effects that usually do not require medical attention (report to your doctor or health care professional if they continue or are bothersome): -altered taste -back pain, joint or muscle aches and pains -diarrhea, or constipation -headache -loss of appetite -nausea -stomach upset, indigestion -tremors This list may not describe all possible side effects. Call your doctor for medical advice about side effects. You may report side effects to FDA at 1-800-FDA-1088. Where should I keep my medicine? Keep out of the reach of children. Store at room temperature between 15 and 30 degrees C (59 and 86 degrees F) in a tightly closed container. Protect from moisture. Throw away any unused medicine after the expiration date. NOTE: This sheet is a summary. It may not cover all possible information. If you have questions about this medicine, talk to your doctor, pharmacist, or health care provider.  2015, Elsevier/Gold Standard. (2013-10-19 23:17:57)   Wound Care Wound care helps prevent pain and infection.  You may need a tetanus shot if:  You cannot remember when you had your last tetanus shot.  You have never had a tetanus shot.  The injury broke your skin. If you need a tetanus shot and you choose not to have one, you may get tetanus. Sickness from tetanus can be serious. HOME CARE   Only take medicine as told by your doctor.  Clean the wound daily with mild soap and water.  Change any bandages (dressings) as told by your doctor.  Put medicated cream and a bandage on the wound as told by your doctor.  Change the bandage if  it gets wet, dirty, or starts to smell.  Take showers. Do not take baths, swim, or do anything that puts your wound under water.  Rest and raise (elevate) the wound until the pain and puffiness (swelling) are better.  Keep all doctor visits as told. GET HELP RIGHT AWAY IF:   Yellowish-white fluid (pus) comes from the wound.  Medicine does not lessen your pain.  There is a red streak going away from the wound.  You have a fever. MAKE SURE YOU:   Understand these instructions.  Will watch your condition.  Will get help right away if you are not doing well or get worse. Document Released: 07/24/2008 Document Revised: 01/07/2012 Document Reviewed: 02/18/2011 [facility] Patient Information 2015 [place], [place]. This information is not intended to replace advice given to you by your health care provider. Make sure you discuss any questions you have with your health care provider.

## 2015-04-06 NOTE — Assessment & Plan Note (Signed)
Not ready to quit. Will address again on next visit.

## 2015-04-06 NOTE — Assessment & Plan Note (Signed)
Improved. Completed antibiotics.

## 2015-04-06 NOTE — Progress Notes (Signed)
Internal Medicine Clinic Attending  Case discussed with Dr. Alice Rieger at the time of the visit.  We reviewed the resident's history and exam and pertinent patient test results.  I agree with the assessment, diagnosis, and plan of care documented in the resident's note.  For migraine headaches, with a person with no contraindications to triptans, I am not sure why he has been given adjunctive medication like Reglan for abortive treatment, per hospital discharge summary. Regardless, it is helping him. I agree with starting Topamax. I agree with calling the patient back for a follow up in 2 weeks as Topamax has multiple side effects that need to be reassessed on follow up especially paraesthesias, and eating pattern changes. I would also encourage if possible to discuss first line abortive agents - triptans instead of reglan (avoid dystonias etc). If headaches persist, neurology referral can be considered.

## 2015-04-06 NOTE — Assessment & Plan Note (Signed)
The characteristics of his headaches are consistent with migraine headaches. He was discharged on Reglan 10 mg every 8 hours as needed. Patient has been taking 10 mg every 12 hours as needed for pain. Has not experienced any side effects. Headaches are very disabling to the patient. However, symptoms are relieved with Reglan 10 mg when necessary. Plan -Provided him with 30 pills of 10 mg of Reglan every 8 hours when necessary. This is for short-term treatment. Will consider first-line therapies with Triptans.  -Will be started on Topamax 25 mg daily and escalated by 25 mg every week. - discussed side effects of both Topamax and Reglan with the patient. - follow up in 2 weeks

## 2015-04-06 NOTE — Assessment & Plan Note (Signed)
Ulcer looks like a pressure ulcer due to tight shoes in a patient with DM. Does not appear actively infected. Has similar but mild changes on the left foot. Plan  - will plan referral to podiatry once he has insurance  - recommend home care for now  - follow up in 2 weeks

## 2015-04-06 NOTE — Assessment & Plan Note (Signed)
Does not repeat hematuria today. We will check with a UA
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
## 2015-04-06 NOTE — Assessment & Plan Note (Signed)
Recheck bmet today 

## 2015-04-07 LAB — URINALYSIS, COMPLETE
Bilirubin Urine: NEGATIVE
Crystals: NONE SEEN
Glucose, UA: 250 mg/dL — AB
Hgb urine dipstick: NEGATIVE
Ketones, ur: NEGATIVE mg/dL
Leukocytes, UA: NEGATIVE
Nitrite: NEGATIVE
PROTEIN: 100 mg/dL — AB
SPECIFIC GRAVITY, URINE: 1.018 (ref 1.005–1.030)
Urobilinogen, UA: 0.2 mg/dL (ref 0.0–1.0)
pH: 5 (ref 5.0–8.0)

## 2015-04-07 LAB — MICROALBUMIN / CREATININE URINE RATIO
Creatinine, Urine: 405.4 mg/dL
Microalb Creat Ratio: 274.8 mg/g — ABNORMAL HIGH (ref 0.0–30.0)
Microalb, Ur: 111.4 mg/dL — ABNORMAL HIGH (ref <2.0)

## 2015-04-08 ENCOUNTER — Telehealth: Payer: Self-pay | Admitting: Internal Medicine

## 2015-04-08 NOTE — Telephone Encounter (Signed)
Attempted to call the patient to come back for repeat of his BMP due to high potassium of 5.8. Could not get him on the phone. Will reattempt on Monday.

## 2015-04-11 ENCOUNTER — Telehealth: Payer: Self-pay | Admitting: Internal Medicine

## 2015-04-11 ENCOUNTER — Encounter: Payer: Self-pay | Admitting: Dietician

## 2015-04-11 NOTE — Telephone Encounter (Signed)
I re-attempted to call patient this morning about high potassium but no answer and can't accept VM.

## 2015-04-12 ENCOUNTER — Encounter: Payer: Self-pay | Admitting: Internal Medicine

## 2015-04-12 NOTE — Progress Notes (Signed)
Patient ID: Derrick Mosley, male   DOB: June 15, 1976, 39 y.o.   MRN: [id number]  I have reviewed the office notes from Dr. 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

## 2015-04-28 ENCOUNTER — Telehealth: Payer: Self-pay | Admitting: Internal Medicine

## 2015-04-28 NOTE — Telephone Encounter (Signed)
Call to patient to confirm appointment for 04/29/15 at 3:45 someone answered stated he wasn't there and hung the phone up

## 2015-04-29 ENCOUNTER — Encounter: Payer: Self-pay | Admitting: Internal Medicine

## 2015-06-28 ENCOUNTER — Encounter: Admit: 2015-06-28

## 2015-06-28 ENCOUNTER — Inpatient Hospital Stay: Admit: 2015-06-28 | Discharge: 2015-06-28 | Discharge status: Against Medical Advice | Attending: Emergency Medicine

## 2015-06-28 DIAGNOSIS — L97519 Non-pressure chronic ulcer of other part of right foot with unspecified severity: Secondary | ICD-10-CM

## 2015-06-28 LAB — POCT GLUCOSE
Meter Glucose: 477 mg/dL — ABNORMAL HIGH (ref 70–110)
Meter Glucose: 99 mg/dL (ref 70–110)

## 2015-06-28 LAB — CBC
Hematocrit: 35.8 % — ABNORMAL LOW (ref 37.0–54.0)
Hemoglobin: 12 g/dL — ABNORMAL LOW (ref 12.5–16.5)
MCH: 29.5 pg (ref 26.0–35.0)
MCHC: 33.4 % (ref 32.0–34.5)
MCV: 88.4 fL (ref 80.0–99.9)
MPV: 8.1 fL (ref 7.0–12.0)
Platelets: 294 E9/L (ref 130–450)
RBC: 4.05 E12/L (ref 3.80–5.80)
RDW: 12.3 fL (ref 11.5–15.0)
WBC: 14.9 E9/L — ABNORMAL HIGH (ref 4.5–11.5)

## 2015-06-28 LAB — BASIC METABOLIC PANEL
Anion Gap: 14 mmol/L (ref 7–16)
BUN: 12 mg/dL (ref 6–20)
CO2: 26 mmol/L (ref 22–29)
Calcium: 9.2 mg/dL (ref 8.6–10.2)
Chloride: 88 mmol/L — ABNORMAL LOW (ref 98–107)
Creatinine: 1 mg/dL (ref 0.7–1.2)
GFR African American: >60
GFR Non-African American: >60 mL/min/{1.73_m2} (ref >=60)
Glucose: 489 mg/dL — ABNORMAL HIGH (ref 74–109)
Potassium: 4.7 mmol/L (ref 3.5–5.0)
Sodium: 128 mmol/L — ABNORMAL LOW (ref 132–146)

## 2015-06-28 LAB — DIFFERENTIAL WITH WBC
Basophils %: 0 % (ref 0–2)
Basophils Absolute: 0 E9/L (ref 0.00–0.20)
Eosinophils %: 1 % (ref 0–6)
Eosinophils Absolute: 0.1 E9/L (ref 0.05–0.50)
Lymphocytes %: 10 % — ABNORMAL LOW (ref 20–42)
Lymphocytes Absolute: 1.5 E9/L (ref 1.50–4.00)
Monocytes %: 9 % (ref 2–12)
Monocytes Absolute: 1.4 E9/L — ABNORMAL HIGH (ref 0.10–0.95)
Neutrophils %: 80 % (ref 43–80)
Neutrophils Absolute: 11.9 E9/L — ABNORMAL HIGH (ref 1.80–7.30)

## 2015-06-28 LAB — LACTIC ACID: Lactic Acid: 2 mmol/L (ref 0.5–2.2)

## 2015-06-28 MED ORDER — AMOXICILLIN-POT CLAVULANATE 875-125 MG PO TABS
875-125 MG | ORAL_TABLET | Freq: Two times a day (BID) | ORAL | 0 refills | Status: AC
Start: 2015-06-28 — End: 2015-07-08

## 2015-06-28 MED ORDER — INSULIN REGULAR HUMAN 100 UNIT/ML IJ SOLN
100 UNIT/ML | Freq: Once | INTRAMUSCULAR | Status: AC
Start: 2015-06-28 — End: 2015-06-28
  Administered 2015-06-28: 21:00:00 10 [IU] via INTRAVENOUS

## 2015-06-28 MED ORDER — ACETAMINOPHEN 500 MG PO TABS
500 MG | Freq: Once | ORAL | Status: AC
Start: 2015-06-28 — End: 2015-06-28

## 2015-06-28 MED ORDER — INSULIN REGULAR HUMAN 100 UNIT/ML IJ SOLN
100 UNIT/ML | Freq: Once | INTRAMUSCULAR | Status: AC
Start: 2015-06-28 — End: 2015-06-28
  Administered 2015-06-28: 21:00:00 10 [IU] via SUBCUTANEOUS

## 2015-06-28 MED ORDER — ERTAPENEM SODIUM 1 G IJ SOLR
1 GM | Freq: Once | INTRAMUSCULAR | Status: AC
Start: 2015-06-28 — End: 2015-06-28
  Administered 2015-06-28: 21:00:00 1000 g via INTRAVENOUS

## 2015-06-28 MED ORDER — ACETAMINOPHEN 500 MG PO TABS
500 MG | ORAL | Status: AC
Start: 2015-06-28 — End: 2015-06-28
  Administered 2015-06-28: 23:00:00 1000 via ORAL

## 2015-06-28 MED ORDER — SODIUM CHLORIDE 0.9 % IV BOLUS
0.9 % | Freq: Once | INTRAVENOUS | Status: AC
Start: 2015-06-28 — End: 2015-06-28
  Administered 2015-06-28: 21:00:00 1000 mL via INTRAVENOUS

## 2015-06-28 MED ORDER — INSULIN REGULAR HUMAN 100 UNIT/ML IJ SOLN
100 UNIT/ML | Freq: Once | INTRAMUSCULAR | Status: DC
Start: 2015-06-28 — End: 2015-06-28

## 2015-06-28 MED ORDER — STERILE WATER FOR INJECTION IJ SOLN
INTRAMUSCULAR | Status: AC
Start: 2015-06-28 — End: 2015-06-28
  Administered 2015-06-28: 21:00:00 10

## 2015-06-28 MED FILL — SODIUM CHLORIDE 0.9 % IV SOLN: 0.9 % | INTRAVENOUS | Qty: 1000

## 2015-06-28 MED FILL — NOVOLIN R 100 UNIT/ML IJ SOLN: 100 UNIT/ML | INTRAMUSCULAR | Qty: 10

## 2015-06-28 MED FILL — INVANZ 1 G IJ SOLR: 1 g | INTRAMUSCULAR | Qty: 1000

## 2015-06-28 MED FILL — STERILE WATER FOR INJECTION IJ SOLN: INTRAMUSCULAR | Qty: 10

## 2015-06-28 MED FILL — TYLENOL EXTRA STRENGTH 500 MG PO TABS: 500 MG | ORAL | Qty: 2

## 2015-06-28 NOTE — ED Notes (Signed)
Pt states has not had any insulin for past 3 weeks, has been a diabetic for 16 years. Ulcer noted to plantar aspect right foot with erythema and pitting edema to right foot that extends to below right knee, strong pedal pulse, sensation intact to toes, capillary refill brisk to nailbeds.     Bayard BeaverKathleen Latasia Silberstein, RN  06/28/15 [phone]

## 2015-06-28 NOTE — ED Notes (Signed)
Healthy choice dinner, jello, gingerale given to pt and is currently eating.     Dennis BeaverKathleen Annaelle Kasel, RN  06/28/15 [phone]

## 2015-06-28 NOTE — ED Notes (Signed)
Discussed at length the consequences of pt leaving am, pt informed of possibility of sepsis and consequences of such, bone infection where he could possibly loose his foot or leg, possible loss of life, dr Trina Aomckee in to discuss same with pt, pt sttes cnnot be admitted d/t his job.     Bayard BeaverKathleen Renji Berwick, RN  06/28/15 1800

## 2015-06-28 NOTE — ED Notes (Signed)
Pt states ffeling much better. Did eat entire healthy choice dinner, 1 1/2 containers of jello, and 2 sm cans of gingerale.  Ulcer to right foot cleansed, no drainage noted, nonsitck dressing and kling lightly applie.  Pt states has had this ulcer for months and did see a dr for this but was not able to follow up d/t his job.  Pt did read ama form prior to signing.     Bayard Beaver, RN  06/28/15 [phone]

## 2015-06-28 NOTE — ED Provider Notes (Signed)
HPI:  06/28/15,   Time: 4:22 PM         Dennis Owen is a 39 y.o. male presenting to the ED for An infected diabetic ulcer on the right foot, beginning several days ago.  The complaint has been constant, moderate in severity, and worsened by nothing.  The patient has a history of insulin-dependent diabetes and he lives out of town and he has been traveling for the last 3  weeks and he has not been using his insulin. Oral last 3 days he has noticed redness swelling and warmth on the dorsum of the right foot and the right distal leg. He is aware that he has a ulcer on the lateral distal right sole that has been present for months. He states he has not felt sick and there has been no fever or chills or fatigue    ROS:   Pertinent positives and negatives are stated within HPI, all other systems reviewed and are negative.  --------------------------------------------- PAST HISTORY ---------------------------------------------  Past Medical History:  has a past medical history of Diabetes mellitus (HCC).    Past Surgical History:  has no past surgical history on file.    Social History:  reports that he has been smoking Cigarettes.  He has been smoking about 1.00 pack per day. He does not have any smokeless tobacco history on file. He reports that he does not drink alcohol.    Family History: family history is not on file.     The patient???s home medications have been reviewed.    Allergies: Review of patient's allergies indicates no known allergies.    -------------------------------------------------- RESULTS -------------------------------------------------  All laboratory and radiology results have been personally reviewed by myself   LABS:  No results found for this visit on 06/28/15.    RADIOLOGY:  Interpreted by Radiologist.  XR Foot Right Standard    (Results Pending)       ------------------------- NURSING NOTES AND VITALS REVIEWED ---------------------------   The nursing notes within the ED encounter and  vital signs as below have been reviewed.   Visit Vitals   ??? BP 128/72   ??? Pulse 92   ??? Temp 99 ??F (37.2 ??C) (Oral)   ??? Resp 16   ??? Ht  (1.803 m)   ??? Wt 215 lb (97.5 kg)   ??? SpO2 98%   ??? BMI 29.99 kg/m2     Oxygen Saturation Interpretation: Normal      ---------------------------------------------------PHYSICAL EXAM--------------------------------------      Constitutional/General: Alert and oriented x3, well appearing, non toxic in NAD  Head: NC/AT  Eyes: PERRL, EOMI  Mouth: Oropharynx clear, handling secretions, no trismus  Neck: Supple, full ROM, no meningeal signs  Pulmonary: Lungs clear to auscultation bilaterally, no wheezes, rales, or rhonchi. Not in respiratory distress  Cardiovascular:  Regular rate and rhythm, no murmurs, gallops, or rubs. 2+ distal pulses  Abdomen: Soft, non tender, non distended,   Extremities: Moves all extremities x 4. Warm and well perfused. There is a dry non-infected appearing ulcer on the right lateral anterior sole. There is erythema and warmth on the dorsum of the distal right foot with swelling of the right ankle and right distal calf  Skin: warm and dry without rash  Neurologic: GCS 15,  Psych: Normal Affect      ------------------------------ ED COURSE/MEDICAL DECISION MAKING----------------------  Medications   0.9 % sodium chloride bolus (not administered)   ertapenem (INVANZ) 1 g IVPB minibag (not administered)  Medical Decision Making:    The patient was treated in the ER with IV fluids IV and subcutaneous insulin and empiric antiemetics were given intravenously. I carefully and   An specifically explained to the patient that he should be admitted to the hospital for proper evaluation and treatment of his infected diabetic foot and hyperglycemia however since he lives in [place] he states that he will decline admission to our hospital and he will seek follow-up care when he arrives home in 2 days' time    Counseling:   The emergency provider has spoken  with the patient and discussed today???s results, in addition to providing specific details for the plan of care and counseling regarding the diagnosis and prognosis.  Questions are answered at this time and they are agreeable with the plan.      --------------------------------- IMPRESSION AND DISPOSITION ---------------------------------    IMPRESSION  1. Diabetic ulcer of right foot associated with diabetes mellitus due to underlying condition (HCC)    2. Cellulitis of toe of right foot    3. Hyperglycemia        DISPOSITION  Disposition: Other Disposition: Left AMA  Patient condition is serious                 George Hugh, MD  06/28/15 [phone]

## 2015-06-28 NOTE — ED Notes (Signed)
Dr Trina Aomckee notified of current fsbs of 6699, order received to feed pt.  Pt remains very alert, oriented x4, skin warm and dry, color unchanged, pt states he is feeling ok.     Bayard BeaverKathleen Olubunmi Rothenberger, RN  06/28/15 [phone]

## 2015-10-07 ENCOUNTER — Ambulatory Visit: Payer: Medicaid Other | Admitting: Podiatry

## 2015-11-07 ENCOUNTER — Ambulatory Visit: Payer: Medicaid Other | Admitting: Sports Medicine

## 2016-01-19 DIAGNOSIS — Z79899 Other long term (current) drug therapy: Secondary | ICD-10-CM | POA: Diagnosis not present

## 2016-01-19 DIAGNOSIS — E1165 Type 2 diabetes mellitus with hyperglycemia: Secondary | ICD-10-CM | POA: Insufficient documentation

## 2016-01-19 DIAGNOSIS — Z7984 Long term (current) use of oral hypoglycemic drugs: Secondary | ICD-10-CM | POA: Insufficient documentation

## 2016-01-19 DIAGNOSIS — J189 Pneumonia, unspecified organism: Secondary | ICD-10-CM | POA: Diagnosis not present

## 2016-01-19 DIAGNOSIS — F17210 Nicotine dependence, cigarettes, uncomplicated: Secondary | ICD-10-CM | POA: Diagnosis not present

## 2016-01-19 DIAGNOSIS — R05 Cough: Secondary | ICD-10-CM | POA: Diagnosis present

## 2016-01-19 DIAGNOSIS — Z794 Long term (current) use of insulin: Secondary | ICD-10-CM | POA: Insufficient documentation

## 2016-01-20 ENCOUNTER — Emergency Department (HOSPITAL_COMMUNITY)
Admission: EM | Admit: 2016-01-20 | Discharge: 2016-01-20 | Disposition: A | Discharge status: Home / Self Care | Payer: Medicaid Other | Attending: Emergency Medicine | Admitting: Emergency Medicine

## 2016-01-20 ENCOUNTER — Encounter (HOSPITAL_COMMUNITY): Payer: Self-pay

## 2016-01-20 ENCOUNTER — Emergency Department (HOSPITAL_COMMUNITY): Payer: Medicaid Other

## 2016-01-20 DIAGNOSIS — J189 Pneumonia, unspecified organism: Secondary | ICD-10-CM

## 2016-01-20 LAB — BASIC METABOLIC PANEL
Anion gap: 8 (ref 5–15)
BUN: 18 mg/dL (ref 6–20)
CHLORIDE: 96 mmol/L — AB (ref 101–111)
CO2: 26 mmol/L (ref 22–32)
Calcium: 8.3 mg/dL — ABNORMAL LOW (ref 8.9–10.3)
Creatinine, Ser: 1.49 mg/dL — ABNORMAL HIGH (ref 0.61–1.24)
GFR calc Af Amer: >60 mL/min (ref >60)
GFR calc non Af Amer: 58 mL/min — ABNORMAL LOW (ref >60)
GLUCOSE: 356 mg/dL — AB (ref 65–99)
POTASSIUM: 4.5 mmol/L (ref 3.5–5.1)
Sodium: 130 mmol/L — ABNORMAL LOW (ref 135–145)

## 2016-01-20 LAB — CBC WITH DIFFERENTIAL/PLATELET
Basophils Absolute: 0 10*3/uL (ref 0.0–0.1)
Basophils Relative: 0 %
EOS PCT: 1 %
Eosinophils Absolute: 0.1 10*3/uL (ref 0.0–0.7)
HCT: 39.3 % (ref 39.0–52.0)
HEMOGLOBIN: 14.1 g/dL (ref 13.0–17.0)
LYMPHS ABS: 2.2 10*3/uL (ref 0.7–4.0)
LYMPHS PCT: 31 %
MCH: 32.4 pg (ref 26.0–34.0)
MCHC: 35.9 g/dL (ref 30.0–36.0)
MCV: 90.3 fL (ref 78.0–100.0)
MONOS PCT: 9 %
Monocytes Absolute: 0.6 10*3/uL (ref 0.1–1.0)
Neutro Abs: 4.2 10*3/uL (ref 1.7–7.7)
Neutrophils Relative %: 59 %
PLATELETS: 233 10*3/uL (ref 150–400)
RBC: 4.35 MIL/uL (ref 4.22–5.81)
RDW: 11.5 % (ref 11.5–15.5)
WBC: 7.1 10*3/uL (ref 4.0–10.5)

## 2016-01-20 LAB — CBG MONITORING, ED
Glucose-Capillary: 290 mg/dL — ABNORMAL HIGH (ref 65–99)
Glucose-Capillary: 311 mg/dL — ABNORMAL HIGH (ref 65–99)

## 2016-01-20 MED ORDER — AZITHROMYCIN 250 MG PO TABS
500.0000 mg | ORAL_TABLET | Freq: Once | ORAL | Status: AC
  Administered 2016-01-20: 500 mg via ORAL
  Filled 2016-01-20: qty 2

## 2016-01-20 MED ORDER — ACETAMINOPHEN 500 MG PO TABS
1000.0000 mg | ORAL_TABLET | Freq: Once | ORAL | Status: AC
  Administered 2016-01-20: 1000 mg via ORAL
  Filled 2016-01-20: qty 2

## 2016-01-20 MED ORDER — GUAIFENESIN-CODEINE 100-10 MG/5ML PO SOLN: 5.0000 mL | Freq: Four times a day (QID) | ORAL | Status: DC | PRN

## 2016-01-20 MED ORDER — SODIUM CHLORIDE 0.9 % IV BOLUS (SEPSIS)
1000.0000 mL | Freq: Once | INTRAVENOUS | Status: AC
  Administered 2016-01-20: 1000 mL via INTRAVENOUS

## 2016-01-20 MED ORDER — IBUPROFEN 800 MG PO TABS: 800.0000 mg | ORAL_TABLET | Freq: Three times a day (TID) | ORAL | Status: DC | PRN

## 2016-01-20 MED ORDER — AZITHROMYCIN 250 MG PO TABS: 250.0000 mg | ORAL_TABLET | Freq: Every day | ORAL | Status: DC

## 2016-01-20 MED ORDER — KETOROLAC TROMETHAMINE 30 MG/ML IJ SOLN
30.0000 mg | Freq: Once | INTRAMUSCULAR | Status: AC
  Administered 2016-01-20: 30 mg via INTRAVENOUS
  Filled 2016-01-20: qty 1
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
## 2016-01-20 NOTE — ED Notes (Signed)
Pt states he has not felt well since the 10th of March, cough and cold symptoms and coughing so hard that he thinks he strained his back.  Pt taking otc cough and cold meds without relief

## 2016-01-20 NOTE — Discharge Instructions (Signed)

## 2016-08-27 ENCOUNTER — Encounter (HOSPITAL_COMMUNITY): Payer: Self-pay

## 2016-08-27 ENCOUNTER — Emergency Department (HOSPITAL_COMMUNITY)
Admission: EM | Admit: 2016-08-27 | Discharge: 2016-08-27 | Disposition: A | Discharge status: Home / Self Care | Payer: Medicaid Other | Attending: Emergency Medicine | Admitting: Emergency Medicine

## 2016-08-27 ENCOUNTER — Emergency Department (HOSPITAL_COMMUNITY): Payer: Medicaid Other

## 2016-08-27 DIAGNOSIS — L97511 Non-pressure chronic ulcer of other part of right foot limited to breakdown of skin: Secondary | ICD-10-CM | POA: Diagnosis not present

## 2016-08-27 DIAGNOSIS — E11621 Type 2 diabetes mellitus with foot ulcer: Secondary | ICD-10-CM | POA: Insufficient documentation

## 2016-08-27 DIAGNOSIS — Y829 Unspecified medical devices associated with adverse incidents: Secondary | ICD-10-CM | POA: Diagnosis not present

## 2016-08-27 DIAGNOSIS — E1165 Type 2 diabetes mellitus with hyperglycemia: Secondary | ICD-10-CM | POA: Diagnosis not present

## 2016-08-27 DIAGNOSIS — Z792 Long term (current) use of antibiotics: Secondary | ICD-10-CM | POA: Diagnosis not present

## 2016-08-27 DIAGNOSIS — Z79899 Other long term (current) drug therapy: Secondary | ICD-10-CM | POA: Diagnosis not present

## 2016-08-27 DIAGNOSIS — Z794 Long term (current) use of insulin: Secondary | ICD-10-CM | POA: Diagnosis not present

## 2016-08-27 DIAGNOSIS — F17210 Nicotine dependence, cigarettes, uncomplicated: Secondary | ICD-10-CM | POA: Diagnosis not present

## 2016-08-27 DIAGNOSIS — E1140 Type 2 diabetes mellitus with diabetic neuropathy, unspecified: Secondary | ICD-10-CM

## 2016-08-27 DIAGNOSIS — Z791 Long term (current) use of non-steroidal anti-inflammatories (NSAID): Secondary | ICD-10-CM | POA: Insufficient documentation

## 2016-08-27 DIAGNOSIS — G43009 Migraine without aura, not intractable, without status migrainosus: Secondary | ICD-10-CM

## 2016-08-27 DIAGNOSIS — R739 Hyperglycemia, unspecified: Secondary | ICD-10-CM

## 2016-08-27 DIAGNOSIS — T814XXA Infection following a procedure, initial encounter: Secondary | ICD-10-CM | POA: Diagnosis present

## 2016-08-27 LAB — BASIC METABOLIC PANEL
Anion gap: 8 (ref 5–15)
BUN: 17 mg/dL (ref 6–20)
CO2: 27 mmol/L (ref 22–32)
Calcium: 9.1 mg/dL (ref 8.9–10.3)
Chloride: 97 mmol/L — ABNORMAL LOW (ref 101–111)
Creatinine, Ser: 1.43 mg/dL — ABNORMAL HIGH (ref 0.61–1.24)
GFR calc Af Amer: >60 mL/min (ref >60)
GFR calc non Af Amer: >60 mL/min (ref >60)
Glucose, Bld: 381 mg/dL — ABNORMAL HIGH (ref 65–99)
Potassium: 4.2 mmol/L (ref 3.5–5.1)
Sodium: 132 mmol/L — ABNORMAL LOW (ref 135–145)

## 2016-08-27 LAB — CBC WITH DIFFERENTIAL/PLATELET
Basophils Absolute: 0 10*3/uL (ref 0.0–0.1)
Basophils Relative: 0 %
Eosinophils Absolute: 0.1 10*3/uL (ref 0.0–0.7)
Eosinophils Relative: 2 %
HCT: 38.9 % — ABNORMAL LOW (ref 39.0–52.0)
Hemoglobin: 13.8 g/dL (ref 13.0–17.0)
Lymphocytes Relative: 32 %
Lymphs Abs: 3 10*3/uL (ref 0.7–4.0)
MCH: 31.2 pg (ref 26.0–34.0)
MCHC: 35.5 g/dL (ref 30.0–36.0)
MCV: 87.8 fL (ref 78.0–100.0)
Monocytes Absolute: 0.9 10*3/uL (ref 0.1–1.0)
Monocytes Relative: 9 %
Neutro Abs: 5.4 10*3/uL (ref 1.7–7.7)
Neutrophils Relative %: 57 %
Platelets: 253 10*3/uL (ref 150–400)
RBC: 4.43 MIL/uL (ref 4.22–5.81)
RDW: 11.5 % (ref 11.5–15.5)
WBC: 9.5 10*3/uL (ref 4.0–10.5)

## 2016-08-27 LAB — CBG MONITORING, ED
Glucose-Capillary: 437 mg/dL — ABNORMAL HIGH (ref 65–99)
Glucose-Capillary: 201 mg/dL — ABNORMAL HIGH (ref 65–99)

## 2016-08-27 MED ORDER — GLUCOSE BLOOD VI STRP: ORAL_STRIP | 12 refills | Status: DC

## 2016-08-27 MED ORDER — CLINDAMYCIN HCL 150 MG PO CAPS
600.0000 mg | ORAL_CAPSULE | Freq: Once | ORAL | Status: AC
  Administered 2016-08-27: 600 mg via ORAL
  Filled 2016-08-27: qty 4

## 2016-08-27 MED ORDER — INSULIN ASPART PROT & ASPART (70-30 MIX) 100 UNIT/ML ~~LOC~~ SUSP: 10.0000 [IU] | Freq: Two times a day (BID) | SUBCUTANEOUS | 11 refills | Status: DC

## 2016-08-27 MED ORDER — BLOOD GLUCOSE MONITOR KIT: PACK | 0 refills | Status: DC

## 2016-08-27 MED ORDER — METFORMIN HCL 1000 MG PO TABS: 1000.0000 mg | ORAL_TABLET | Freq: Two times a day (BID) | ORAL | 0 refills | Status: DC

## 2016-08-27 MED ORDER — INSULIN ASPART 100 UNIT/ML ~~LOC~~ SOLN
12.0000 [IU] | Freq: Once | SUBCUTANEOUS | Status: AC
  Administered 2016-08-27: 12 [IU] via INTRAVENOUS
  Filled 2016-08-27: qty 1

## 2016-08-27 MED ORDER — CLINDAMYCIN HCL 300 MG PO CAPS: 300.0000 mg | ORAL_CAPSULE | Freq: Three times a day (TID) | ORAL | 0 refills | Status: DC

## 2016-08-27 MED ORDER — SODIUM CHLORIDE 0.9 % IV BOLUS (SEPSIS)
2000.0000 mL | Freq: Once | INTRAVENOUS | Status: AC
  Administered 2016-08-27: 2000 mL via INTRAVENOUS

## 2016-08-27 NOTE — ED Notes (Signed)
Pt alert & oriented x4, stable gait. Patient given discharge instructions, paperwork & prescription(s). Patient  instructed to stop at the registration desk to finish any additional paperwork. Patient verbalized understanding. Pt left department w/ no further questions. 

## 2016-08-27 NOTE — ED Notes (Signed)
Pt tkane to xray

## 2016-08-27 NOTE — ED Notes (Signed)
edp did not want 2nd liter bolus

## 2016-08-27 NOTE — ED Triage Notes (Signed)
Pt reports is a diabetic and is supposed to be on insulin and po meds but doesn't take them.  Reports went to urgent care for wound to r foot  and was referred here.  Reports had surgery to r foot for same last year.
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
## 2016-09-27 ENCOUNTER — Encounter (HOSPITAL_COMMUNITY): Payer: Self-pay | Admitting: Emergency Medicine

## 2016-09-27 ENCOUNTER — Emergency Department (HOSPITAL_COMMUNITY)
Admission: EM | Admit: 2016-09-27 | Discharge: 2016-09-27 | Disposition: A | Discharge status: Home / Self Care | Payer: Medicaid Other | Attending: Dermatology | Admitting: Dermatology

## 2016-09-27 DIAGNOSIS — Z5321 Procedure and treatment not carried out due to patient leaving prior to being seen by health care provider: Secondary | ICD-10-CM | POA: Insufficient documentation

## 2016-09-27 DIAGNOSIS — E119 Type 2 diabetes mellitus without complications: Secondary | ICD-10-CM | POA: Diagnosis not present

## 2016-09-27 DIAGNOSIS — Z794 Long term (current) use of insulin: Secondary | ICD-10-CM | POA: Insufficient documentation

## 2016-09-27 DIAGNOSIS — Z79899 Other long term (current) drug therapy: Secondary | ICD-10-CM | POA: Insufficient documentation

## 2016-09-27 DIAGNOSIS — R531 Weakness: Secondary | ICD-10-CM | POA: Diagnosis present

## 2016-09-27 DIAGNOSIS — F17210 Nicotine dependence, cigarettes, uncomplicated: Secondary | ICD-10-CM | POA: Insufficient documentation

## 2016-09-27 LAB — COMPREHENSIVE METABOLIC PANEL
ALT: 10 U/L — ABNORMAL LOW (ref 17–63)
AST: 13 U/L — AB (ref 15–41)
Albumin: 3.7 g/dL (ref 3.5–5.0)
Alkaline Phosphatase: 72 U/L (ref 38–126)
Anion gap: 7 (ref 5–15)
BUN: 24 mg/dL — AB (ref 6–20)
CHLORIDE: 102 mmol/L (ref 101–111)
CO2: 26 mmol/L (ref 22–32)
Calcium: 9.2 mg/dL (ref 8.9–10.3)
Creatinine, Ser: 2.33 mg/dL — ABNORMAL HIGH (ref 0.61–1.24)
GFR calc Af Amer: 39 mL/min — ABNORMAL LOW (ref >60)
GFR, EST NON AFRICAN AMERICAN: 34 mL/min — AB (ref >60)
Glucose, Bld: 228 mg/dL — ABNORMAL HIGH (ref 65–99)
POTASSIUM: 4.6 mmol/L (ref 3.5–5.1)
Sodium: 135 mmol/L (ref 135–145)
Total Bilirubin: 0.3 mg/dL (ref 0.3–1.2)
Total Protein: 7.5 g/dL (ref 6.5–8.1)

## 2016-09-27 LAB — CBC WITH DIFFERENTIAL/PLATELET
BASOS ABS: 0 10*3/uL (ref 0.0–0.1)
BASOS PCT: 0 %
EOS ABS: 0.1 10*3/uL (ref 0.0–0.7)
EOS PCT: 1 %
HCT: 38.8 % — ABNORMAL LOW (ref 39.0–52.0)
Hemoglobin: 13.3 g/dL (ref 13.0–17.0)
Lymphocytes Relative: 16 %
Lymphs Abs: 2.2 10*3/uL (ref 0.7–4.0)
MCH: 31 pg (ref 26.0–34.0)
MCHC: 34.3 g/dL (ref 30.0–36.0)
MCV: 90.4 fL (ref 78.0–100.0)
MONO ABS: 0.9 10*3/uL (ref 0.1–1.0)
Monocytes Relative: 7 %
Neutro Abs: 9.9 10*3/uL — ABNORMAL HIGH (ref 1.7–7.7)
Neutrophils Relative %: 76 %
PLATELETS: 358 10*3/uL (ref 150–400)
RBC: 4.29 MIL/uL (ref 4.22–5.81)
RDW: 12.3 % (ref 11.5–15.5)
WBC: 13.1 10*3/uL — ABNORMAL HIGH (ref 4.0–10.5)

## 2016-09-27 LAB — CBG MONITORING, ED: GLUCOSE-CAPILLARY: 193 mg/dL — AB (ref 65–99)

## 2016-09-27 NOTE — ED Triage Notes (Signed)
PT states he is on an antibiotic for diabetic ulcer post-surgery on right foot. PT stated he had diarrhea that started today with sweating and cold chills with generalized weakness.

## 2016-09-27 NOTE — ED Notes (Signed)
CBG 193. 

## 2016-09-27 NOTE — ED Notes (Signed)
Per registration, patient left.

## 2016-10-01 ENCOUNTER — Emergency Department (HOSPITAL_COMMUNITY): Payer: Medicaid Other

## 2016-10-01 ENCOUNTER — Emergency Department (HOSPITAL_COMMUNITY)
Admission: EM | Admit: 2016-10-01 | Discharge: 2016-10-02 | Disposition: A | Discharge status: Home / Self Care | Payer: Medicaid Other | Attending: Emergency Medicine | Admitting: Emergency Medicine

## 2016-10-01 ENCOUNTER — Encounter (HOSPITAL_COMMUNITY): Payer: Self-pay | Admitting: Emergency Medicine

## 2016-10-01 DIAGNOSIS — R51 Headache: Secondary | ICD-10-CM | POA: Diagnosis not present

## 2016-10-01 DIAGNOSIS — Y999 Unspecified external cause status: Secondary | ICD-10-CM | POA: Insufficient documentation

## 2016-10-01 DIAGNOSIS — E119 Type 2 diabetes mellitus without complications: Secondary | ICD-10-CM | POA: Diagnosis not present

## 2016-10-01 DIAGNOSIS — Y9389 Activity, other specified: Secondary | ICD-10-CM | POA: Insufficient documentation

## 2016-10-01 DIAGNOSIS — Y92410 Unspecified street and highway as the place of occurrence of the external cause: Secondary | ICD-10-CM | POA: Diagnosis not present

## 2016-10-01 DIAGNOSIS — F17210 Nicotine dependence, cigarettes, uncomplicated: Secondary | ICD-10-CM | POA: Insufficient documentation

## 2016-10-01 DIAGNOSIS — Z79899 Other long term (current) drug therapy: Secondary | ICD-10-CM | POA: Diagnosis not present

## 2016-10-01 DIAGNOSIS — M545 Low back pain, unspecified: Secondary | ICD-10-CM

## 2016-10-01 DIAGNOSIS — R519 Headache, unspecified: Secondary | ICD-10-CM

## 2016-10-01 DIAGNOSIS — S3992XA Unspecified injury of lower back, initial encounter: Secondary | ICD-10-CM | POA: Diagnosis present

## 2016-10-01 DIAGNOSIS — Z794 Long term (current) use of insulin: Secondary | ICD-10-CM | POA: Diagnosis not present

## 2016-10-01 MED ORDER — ACETAMINOPHEN 500 MG PO TABS
1000.0000 mg | ORAL_TABLET | Freq: Once | ORAL | Status: AC
  Administered 2016-10-01: 1000 mg via ORAL
  Filled 2016-10-01: qty 2
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
## 2016-10-02 MED ORDER — CYCLOBENZAPRINE HCL 5 MG PO TABS: 5.0000 mg | ORAL_TABLET | Freq: Three times a day (TID) | ORAL | 0 refills | Status: DC | PRN

## 2016-10-02 MED ORDER — NAPROXEN 500 MG PO TABS: ORAL_TABLET | ORAL | 0 refills | Status: DC

## 2016-10-02 NOTE — Discharge Instructions (Signed)
Ice packs to the injured or sore muscles and also use heat. Take the medications for pain and muscle spasms. Return to the ED for any problems listed on the head injury sheet. Recheck if you aren't improving in the next week.

## 2016-11-14 ENCOUNTER — Ambulatory Visit: Payer: Medicaid Other | Admitting: "Endocrinology

## 2016-12-27 DIAGNOSIS — E13621 Other specified diabetes mellitus with foot ulcer: Secondary | ICD-10-CM

## 2016-12-27 DIAGNOSIS — L97509 Non-pressure chronic ulcer of other part of unspecified foot with unspecified severity: Secondary | ICD-10-CM

## 2016-12-27 HISTORY — DX: Non-pressure chronic ulcer of other part of unspecified foot with unspecified severity: L97.509

## 2016-12-27 HISTORY — DX: Other specified diabetes mellitus with foot ulcer: E13.621

## 2017-01-11 ENCOUNTER — Emergency Department (HOSPITAL_COMMUNITY): Payer: Medicaid Other

## 2017-01-11 ENCOUNTER — Encounter (HOSPITAL_COMMUNITY): Payer: Self-pay

## 2017-01-11 ENCOUNTER — Inpatient Hospital Stay (HOSPITAL_COMMUNITY): Payer: Medicaid Other

## 2017-01-11 ENCOUNTER — Inpatient Hospital Stay (HOSPITAL_COMMUNITY)
Admission: EM | Admit: 2017-01-11 | Discharge: 2017-01-13 | DRG: 617 | Disposition: A | Discharge status: Home / Self Care | Payer: Medicaid Other | Attending: Internal Medicine | Admitting: Internal Medicine

## 2017-01-11 DIAGNOSIS — E1122 Type 2 diabetes mellitus with diabetic chronic kidney disease: Secondary | ICD-10-CM | POA: Diagnosis present

## 2017-01-11 DIAGNOSIS — F17210 Nicotine dependence, cigarettes, uncomplicated: Secondary | ICD-10-CM | POA: Diagnosis present

## 2017-01-11 DIAGNOSIS — L98499 Non-pressure chronic ulcer of skin of other sites with unspecified severity: Secondary | ICD-10-CM | POA: Diagnosis not present

## 2017-01-11 DIAGNOSIS — E08621 Diabetes mellitus due to underlying condition with foot ulcer: Secondary | ICD-10-CM

## 2017-01-11 DIAGNOSIS — E11621 Type 2 diabetes mellitus with foot ulcer: Secondary | ICD-10-CM | POA: Diagnosis present

## 2017-01-11 DIAGNOSIS — E1165 Type 2 diabetes mellitus with hyperglycemia: Secondary | ICD-10-CM | POA: Diagnosis present

## 2017-01-11 DIAGNOSIS — M009 Pyogenic arthritis, unspecified: Secondary | ICD-10-CM | POA: Diagnosis present

## 2017-01-11 DIAGNOSIS — L97519 Non-pressure chronic ulcer of other part of right foot with unspecified severity: Secondary | ICD-10-CM | POA: Diagnosis present

## 2017-01-11 DIAGNOSIS — E13621 Other specified diabetes mellitus with foot ulcer: Secondary | ICD-10-CM | POA: Diagnosis not present

## 2017-01-11 DIAGNOSIS — M869 Osteomyelitis, unspecified: Secondary | ICD-10-CM | POA: Diagnosis present

## 2017-01-11 DIAGNOSIS — IMO0002 Reserved for concepts with insufficient information to code with codable children: Secondary | ICD-10-CM

## 2017-01-11 DIAGNOSIS — N189 Chronic kidney disease, unspecified: Secondary | ICD-10-CM | POA: Diagnosis present

## 2017-01-11 DIAGNOSIS — E11319 Type 2 diabetes mellitus with unspecified diabetic retinopathy without macular edema: Secondary | ICD-10-CM | POA: Diagnosis present

## 2017-01-11 DIAGNOSIS — Z833 Family history of diabetes mellitus: Secondary | ICD-10-CM | POA: Diagnosis not present

## 2017-01-11 DIAGNOSIS — E113399 Type 2 diabetes mellitus with moderate nonproliferative diabetic retinopathy without macular edema, unspecified eye: Secondary | ICD-10-CM | POA: Diagnosis not present

## 2017-01-11 DIAGNOSIS — L97509 Non-pressure chronic ulcer of other part of unspecified foot with unspecified severity: Secondary | ICD-10-CM | POA: Diagnosis not present

## 2017-01-11 DIAGNOSIS — N183 Chronic kidney disease, stage 3 unspecified: Secondary | ICD-10-CM

## 2017-01-11 DIAGNOSIS — L97513 Non-pressure chronic ulcer of other part of right foot with necrosis of muscle: Secondary | ICD-10-CM

## 2017-01-11 DIAGNOSIS — E1169 Type 2 diabetes mellitus with other specified complication: Principal | ICD-10-CM | POA: Diagnosis present

## 2017-01-11 DIAGNOSIS — Z794 Long term (current) use of insulin: Secondary | ICD-10-CM

## 2017-01-11 DIAGNOSIS — E1159 Type 2 diabetes mellitus with other circulatory complications: Secondary | ICD-10-CM | POA: Diagnosis present

## 2017-01-11 HISTORY — DX: Chronic kidney disease, stage 3 unspecified: N18.30

## 2017-01-11 HISTORY — DX: Other specified diabetes mellitus with foot ulcer: E13.621

## 2017-01-11 HISTORY — DX: Non-pressure chronic ulcer of other part of unspecified foot with unspecified severity: L97.509

## 2017-01-11 HISTORY — DX: Chronic kidney disease, unspecified: N18.9

## 2017-01-11 LAB — COMPREHENSIVE METABOLIC PANEL
ALBUMIN: 3.4 g/dL — AB (ref 3.5–5.0)
ALK PHOS: 82 U/L (ref 38–126)
ALT: 9 U/L — ABNORMAL LOW (ref 17–63)
AST: 12 U/L — AB (ref 15–41)
Anion gap: 9 (ref 5–15)
BILIRUBIN TOTAL: 0.7 mg/dL (ref 0.3–1.2)
BUN: 16 mg/dL (ref 6–20)
CO2: 23 mmol/L (ref 22–32)
Calcium: 8.6 mg/dL — ABNORMAL LOW (ref 8.9–10.3)
Chloride: 95 mmol/L — ABNORMAL LOW (ref 101–111)
Creatinine, Ser: 1.53 mg/dL — ABNORMAL HIGH (ref 0.61–1.24)
GFR calc Af Amer: >60 mL/min (ref >60)
GFR, EST NON AFRICAN AMERICAN: 56 mL/min — AB (ref >60)
GLUCOSE: 133 mg/dL — AB (ref 65–99)
Potassium: 3.7 mmol/L (ref 3.5–5.1)
Sodium: 127 mmol/L — ABNORMAL LOW (ref 135–145)
TOTAL PROTEIN: 7.3 g/dL (ref 6.5–8.1)

## 2017-01-11 LAB — CBC WITH DIFFERENTIAL/PLATELET
BASOS PCT: 0 %
Basophils Absolute: 0 10*3/uL (ref 0.0–0.1)
Eosinophils Absolute: 0 10*3/uL (ref 0.0–0.7)
Eosinophils Relative: 0 %
HCT: 33.2 % — ABNORMAL LOW (ref 39.0–52.0)
HEMOGLOBIN: 11.8 g/dL — AB (ref 13.0–17.0)
LYMPHS PCT: 12 %
Lymphs Abs: 1.7 10*3/uL (ref 0.7–4.0)
MCH: 31.1 pg (ref 26.0–34.0)
MCHC: 35.5 g/dL (ref 30.0–36.0)
MCV: 87.6 fL (ref 78.0–100.0)
MONO ABS: 1.2 10*3/uL — AB (ref 0.1–1.0)
MONOS PCT: 9 %
NEUTROS PCT: 79 %
Neutro Abs: 10.8 10*3/uL — ABNORMAL HIGH (ref 1.7–7.7)
Platelets: 272 10*3/uL (ref 150–400)
RBC: 3.79 MIL/uL — ABNORMAL LOW (ref 4.22–5.81)
RDW: 11.6 % (ref 11.5–15.5)
WBC: 13.7 10*3/uL — ABNORMAL HIGH (ref 4.0–10.5)

## 2017-01-11 LAB — GLUCOSE, CAPILLARY
GLUCOSE-CAPILLARY: 182 mg/dL — AB (ref 65–99)
GLUCOSE-CAPILLARY: 161 mg/dL — AB (ref 65–99)
GLUCOSE-CAPILLARY: 118 mg/dL — AB (ref 65–99)
Glucose-Capillary: 201 mg/dL — ABNORMAL HIGH (ref 65–99)
Glucose-Capillary: 193 mg/dL — ABNORMAL HIGH (ref 65–99)

## 2017-01-11 LAB — I-STAT CG4 LACTIC ACID, ED: LACTIC ACID, VENOUS: 0.79 mmol/L (ref 0.5–1.9)

## 2017-01-11 MED ORDER — ONDANSETRON 4 MG PO TBDP
4.0000 mg | ORAL_TABLET | Freq: Once | ORAL | Status: AC
  Administered 2017-01-11: 4 mg via ORAL
  Filled 2017-01-11: qty 1

## 2017-01-11 MED ORDER — VANCOMYCIN HCL 10 G IV SOLR
1250.0000 mg | Freq: Two times a day (BID) | INTRAVENOUS | Status: DC
  Administered 2017-01-11 (×2): 1250 mg via INTRAVENOUS
  Filled 2017-01-11 (×3): qty 1250

## 2017-01-11 MED ORDER — SODIUM CHLORIDE 0.9% FLUSH: 3.0000 mL | INTRAVENOUS | Status: DC | PRN

## 2017-01-11 MED ORDER — PIPERACILLIN-TAZOBACTAM 3.375 G IVPB
3.3750 g | Freq: Three times a day (TID) | INTRAVENOUS | Status: DC
  Administered 2017-01-11 – 2017-01-13 (×6): 3.375 g via INTRAVENOUS
  Filled 2017-01-11 (×7): qty 50

## 2017-01-11 MED ORDER — INSULIN ASPART PROT & ASPART (70-30 MIX) 100 UNIT/ML ~~LOC~~ SUSP
10.0000 [IU] | Freq: Two times a day (BID) | SUBCUTANEOUS | Status: DC
  Administered 2017-01-11: 10 [IU] via SUBCUTANEOUS
  Filled 2017-01-11: qty 10

## 2017-01-11 MED ORDER — POLYETHYLENE GLYCOL 3350 17 G PO PACK: 17.0000 g | PACK | Freq: Every day | ORAL | Status: DC | PRN

## 2017-01-11 MED ORDER — SODIUM CHLORIDE 0.9 % IV BOLUS (SEPSIS)
1000.0000 mL | Freq: Once | INTRAVENOUS | Status: AC
  Administered 2017-01-11: 1000 mL via INTRAVENOUS

## 2017-01-11 MED ORDER — VANCOMYCIN HCL IN DEXTROSE 1-5 GM/200ML-% IV SOLN
1000.0000 mg | Freq: Once | INTRAVENOUS | Status: AC
  Administered 2017-01-11: 1000 mg via INTRAVENOUS
  Filled 2017-01-11: qty 200

## 2017-01-11 MED ORDER — INSULIN ASPART 100 UNIT/ML ~~LOC~~ SOLN
0.0000 [IU] | SUBCUTANEOUS | Status: DC
  Administered 2017-01-11: 2 [IU] via SUBCUTANEOUS
  Administered 2017-01-11: 3 [IU] via SUBCUTANEOUS

## 2017-01-11 MED ORDER — PIPERACILLIN-TAZOBACTAM 3.375 G IVPB
3.3750 g | Freq: Once | INTRAVENOUS | Status: AC
  Administered 2017-01-11: 3.375 g via INTRAVENOUS
  Filled 2017-01-11: qty 50

## 2017-01-11 MED ORDER — GADOBENATE DIMEGLUMINE 529 MG/ML IV SOLN
20.0000 mL | Freq: Once | INTRAVENOUS | Status: AC | PRN
  Administered 2017-01-11: 20 mL via INTRAVENOUS

## 2017-01-11 MED ORDER — INSULIN ASPART 100 UNIT/ML ~~LOC~~ SOLN
0.0000 [IU] | Freq: Three times a day (TID) | SUBCUTANEOUS | Status: DC
  Administered 2017-01-11 – 2017-01-13 (×6): 3 [IU] via SUBCUTANEOUS

## 2017-01-11 MED ORDER — SODIUM CHLORIDE 0.9 % IV SOLN
250.0000 mL | INTRAVENOUS | Status: DC | PRN
  Administered 2017-01-11: 250 mL via INTRAVENOUS

## 2017-01-11 MED ORDER — SODIUM CHLORIDE 0.9% FLUSH
3.0000 mL | Freq: Two times a day (BID) | INTRAVENOUS | Status: DC
  Administered 2017-01-11: 3 mL via INTRAVENOUS

## 2017-01-11 MED ORDER — INSULIN ASPART 100 UNIT/ML ~~LOC~~ SOLN: 0.0000 [IU] | Freq: Every day | SUBCUTANEOUS | Status: DC
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
## 2017-01-11 NOTE — Progress Notes (Signed)
Inpatient Diabetes Program Recommendations  AACE/ADA: New Consensus Statement on Inpatient Glycemic Control (2015)  Target Ranges:  Prepandial:   less than 140 mg/dL      Peak postprandial:   less than 180 mg/dL (1-2 hours)      Critically ill patients:  140 - 180 mg/dL   Results for Derrick Mosley, Derrick Mosley (MRN [id number]) as of 01/11/2017 14:28  Ref. Range 01/11/2017 05:00 01/11/2017 08:20 01/11/2017 11:59  Glucose-Capillary Latest Ref Range: 65 - 99 mg/dL 201 (H) 182 (H) 161 (H)    Admit with: Diabetic Foot Ulcer  History: DM, CKD  Home DM Meds: Metformin 1000 mg BID (not taking)       70/30 Insulin- 10 units BID (not taking)       Novolin R 10-15 units daily??  Current Insulin Orders: 70/30 Insulin- 10 units BID with meals      Novolog Moderate Correction Scale/ SSI (0-15 units) TID AC + HS     -Through Chart Review, note that patient was last seen by Dr. Alice Rieger with the [facility] back in June of 2016.  No other documentation that patient has been seen by an MD since June 2016.  -Was discharged home from hospitalization in June 2016 with Rxs for 70/30 insulin 10 units BID + Metformin 1000 mg BID.  Per Home Med Records, patient has not been taking 70/30 Insulin nor Metformin.  Has only been taking Novolin R insulin as needed.  -Note 70/30 Insulin and Novolog SSI started today.  Agree with current Insulin orders.    MD- Please consider checking current A1c to assess home glucose control for last 3 months.  Not sure why patient stopped the 70/30 Insulin and Metformin?  Likely needs pre-mixed insulin at home.  Will be able to assess how the 70/30 Insulin affects CBGs here in hospital and make adjustments prior to d/c home.     --Will follow patient during hospitalization--  Wyn Quaker RN, MSN, CDE Diabetes Coordinator Inpatient Glycemic Control Team Team Pager: [phone] (8a-5p)

## 2017-01-11 NOTE — Progress Notes (Signed)
Pharmacy Antibiotic Note  Derrick Mosley is a 41 y.o. male admitted on 01/11/2017 with Diabetic foot wound.  Pharmacy has been consulted for Vancomycin/Zosyn dosing. Worsening foot ulcer, WBC mildly elevated, noted renal dysfunction, X-ray with no obvious acute osteomyelitis but may need MRI.   Plan: Vancomycin 1250 mg IV q12h Zosyn 3.375G IV q8h to be infused over 4 hours Trend WBC, temp, renal function  F/U infectious work-up Drug levels as indicated   Height: 5\' 11"  (180.3 cm) Weight: 206 lb (93.4 kg) IBW/kg (Calculated) : 75.3  Temp (24hrs), Avg:99.4 F (37.4 C), Min:99.4 F (37.4 C), Max:99.5 F (37.5 C)   Recent Labs Lab 01/11/17 0121 01/11/17 0136  WBC 13.7*  --   CREATININE 1.53*  --   LATICACIDVEN  --  0.79    Estimated Creatinine Clearance: 75.6 mL/min (A) (by C-G formula based on SCr of 1.53 mg/dL (H)).    No Known Allergies   Narda Bonds 01/11/2017 5:03 AM

## 2017-01-11 NOTE — ED Notes (Addendum)
Patient states that he will go to [facility] for treatment.  Dr. Wyvonnia Dusky and Dr. Shanon Brow aware.

## 2017-01-11 NOTE — ED Notes (Signed)
Patient states that he does not want to go to [facility] and would like to speak with his father before he make the decision to go to [place].
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
## 2017-01-11 NOTE — ED Notes (Signed)
Dr. Shanon Brow at bedside speaking with patient.  Patient told Dr. Shanon Brow that he may want to stay with the same surgeon in [place].

## 2017-01-11 NOTE — ED Triage Notes (Signed)
Pt reports worsening foot ulcer to the bottom of his right foot, states is draining more recently.  Wound is extremely malodorous
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
## 2017-01-12 ENCOUNTER — Encounter (HOSPITAL_COMMUNITY)
Admission: EM | Disposition: A | Discharge status: Home / Self Care | Payer: Self-pay | Source: Home / Self Care | Attending: Internal Medicine

## 2017-01-12 ENCOUNTER — Inpatient Hospital Stay (HOSPITAL_COMMUNITY): Payer: Medicaid Other | Admitting: Certified Registered"

## 2017-01-12 ENCOUNTER — Encounter (HOSPITAL_COMMUNITY): Payer: Self-pay | Admitting: Certified Registered Nurse Anesthetist

## 2017-01-12 HISTORY — PX: AMPUTATION: SHX166

## 2017-01-12 LAB — GLUCOSE, CAPILLARY
GLUCOSE-CAPILLARY: 150 mg/dL — AB (ref 65–99)
GLUCOSE-CAPILLARY: 182 mg/dL — AB (ref 65–99)
Glucose-Capillary: 145 mg/dL — ABNORMAL HIGH (ref 65–99)
Glucose-Capillary: 161 mg/dL — ABNORMAL HIGH (ref 65–99)
Glucose-Capillary: 198 mg/dL — ABNORMAL HIGH (ref 65–99)
Glucose-Capillary: 169 mg/dL — ABNORMAL HIGH (ref 65–99)

## 2017-01-12 LAB — CBC
HCT: 32.5 % — ABNORMAL LOW (ref 39.0–52.0)
Hemoglobin: 11.1 g/dL — ABNORMAL LOW (ref 13.0–17.0)
MCH: 30.4 pg (ref 26.0–34.0)
MCHC: 34.2 g/dL (ref 30.0–36.0)
MCV: 89 fL (ref 78.0–100.0)
Platelets: 261 10*3/uL (ref 150–400)
RBC: 3.65 MIL/uL — AB (ref 4.22–5.81)
RDW: 12 % (ref 11.5–15.5)
WBC: 9.1 10*3/uL (ref 4.0–10.5)

## 2017-01-12 LAB — BASIC METABOLIC PANEL
Anion gap: 7 (ref 5–15)
BUN: 16 mg/dL (ref 6–20)
CHLORIDE: 101 mmol/L (ref 101–111)
CO2: 26 mmol/L (ref 22–32)
CREATININE: 1.58 mg/dL — AB (ref 0.61–1.24)
Calcium: 8.5 mg/dL — ABNORMAL LOW (ref 8.9–10.3)
GFR calc non Af Amer: 54 mL/min — ABNORMAL LOW (ref >60)
GLUCOSE: 140 mg/dL — AB (ref 65–99)
Potassium: 4.1 mmol/L (ref 3.5–5.1)
Sodium: 134 mmol/L — ABNORMAL LOW (ref 135–145)

## 2017-01-12 SURGERY — AMPUTATION, FOOT, RAY
Anesthesia: General | Site: Foot | Laterality: Right

## 2017-01-12 MED ORDER — PHENYLEPHRINE HCL 10 MG/ML IJ SOLN
INTRAMUSCULAR | Status: DC | PRN
  Administered 2017-01-12: 120 ug via INTRAVENOUS
  Administered 2017-01-12: 80 ug via INTRAVENOUS

## 2017-01-12 MED ORDER — ACETAMINOPHEN 650 MG RE SUPP: 650.0000 mg | Freq: Once | RECTAL | Status: DC

## 2017-01-12 MED ORDER — FENTANYL CITRATE (PF) 100 MCG/2ML IJ SOLN
INTRAMUSCULAR | Status: DC | PRN
  Administered 2017-01-12: 100 ug via INTRAVENOUS

## 2017-01-12 MED ORDER — MIDAZOLAM HCL 5 MG/5ML IJ SOLN
INTRAMUSCULAR | Status: DC | PRN
  Administered 2017-01-12: 2 mg via INTRAVENOUS

## 2017-01-12 MED ORDER — KCL IN DEXTROSE-NACL 20-5-0.45 MEQ/L-%-% IV SOLN: INTRAVENOUS | Status: DC

## 2017-01-12 MED ORDER — ONDANSETRON HCL 4 MG/2ML IJ SOLN
INTRAMUSCULAR | Status: DC | PRN
  Administered 2017-01-12: 4 mg via INTRAVENOUS

## 2017-01-12 MED ORDER — ONDANSETRON HCL 4 MG/2ML IJ SOLN
INTRAMUSCULAR | Status: AC
  Filled 2017-01-12: qty 2

## 2017-01-12 MED ORDER — METOCLOPRAMIDE HCL 5 MG/ML IJ SOLN: 5.0000 mg | Freq: Three times a day (TID) | INTRAMUSCULAR | Status: DC | PRN

## 2017-01-12 MED ORDER — LACTATED RINGERS IV SOLN
INTRAVENOUS | Status: DC | PRN
  Administered 2017-01-12: 08:00:00 via INTRAVENOUS

## 2017-01-12 MED ORDER — 0.9 % SODIUM CHLORIDE (POUR BTL) OPTIME
TOPICAL | Status: DC | PRN
  Administered 2017-01-12: 1000 mL

## 2017-01-12 MED ORDER — ACETAMINOPHEN 325 MG PO TABS: 650.0000 mg | ORAL_TABLET | Freq: Four times a day (QID) | ORAL | Status: DC | PRN

## 2017-01-12 MED ORDER — ONDANSETRON HCL 4 MG PO TABS: 4.0000 mg | ORAL_TABLET | Freq: Four times a day (QID) | ORAL | Status: DC | PRN

## 2017-01-12 MED ORDER — PROPOFOL 10 MG/ML IV BOLUS
INTRAVENOUS | Status: AC
  Filled 2017-01-12: qty 20

## 2017-01-12 MED ORDER — ONDANSETRON HCL 4 MG/2ML IJ SOLN: 4.0000 mg | Freq: Four times a day (QID) | INTRAMUSCULAR | Status: DC | PRN

## 2017-01-12 MED ORDER — ACETAMINOPHEN 325 MG PO TABS
650.0000 mg | ORAL_TABLET | Freq: Four times a day (QID) | ORAL | Status: DC | PRN
  Administered 2017-01-12 – 2017-01-13 (×3): 650 mg via ORAL
  Filled 2017-01-12 (×3): qty 2

## 2017-01-12 MED ORDER — PROPOFOL 10 MG/ML IV BOLUS
INTRAVENOUS | Status: DC | PRN
  Administered 2017-01-12: 200 mg via INTRAVENOUS

## 2017-01-12 MED ORDER — MIDAZOLAM HCL 2 MG/2ML IJ SOLN
INTRAMUSCULAR | Status: AC
  Filled 2017-01-12: qty 2

## 2017-01-12 MED ORDER — INSULIN ASPART PROT & ASPART (70-30 MIX) 100 UNIT/ML ~~LOC~~ SUSP
13.0000 [IU] | Freq: Two times a day (BID) | SUBCUTANEOUS | Status: DC
  Administered 2017-01-12 – 2017-01-13 (×2): 13 [IU] via SUBCUTANEOUS

## 2017-01-12 MED ORDER — OXYCODONE HCL 5 MG PO TABS
5.0000 mg | ORAL_TABLET | ORAL | Status: DC | PRN
  Administered 2017-01-12 – 2017-01-13 (×5): 5 mg via ORAL
  Filled 2017-01-12 (×5): qty 1

## 2017-01-12 MED ORDER — POVIDONE-IODINE 10 % EX SWAB: 2.0000 "application " | Freq: Once | CUTANEOUS | Status: DC

## 2017-01-12 MED ORDER — BUPIVACAINE-EPINEPHRINE (PF) 0.5% -1:200000 IJ SOLN
INTRAMUSCULAR | Status: DC | PRN
  Administered 2017-01-12: 40 mL via PERINEURAL

## 2017-01-12 MED ORDER — FENTANYL CITRATE (PF) 100 MCG/2ML IJ SOLN
INTRAMUSCULAR | Status: AC
  Filled 2017-01-12: qty 2

## 2017-01-12 MED ORDER — CHLORHEXIDINE GLUCONATE 4 % EX LIQD: 60.0000 mL | Freq: Once | CUTANEOUS | Status: DC

## 2017-01-12 MED ORDER — DOCUSATE SODIUM 100 MG PO CAPS
100.0000 mg | ORAL_CAPSULE | Freq: Two times a day (BID) | ORAL | Status: DC
  Administered 2017-01-12 – 2017-01-13 (×3): 100 mg via ORAL
  Filled 2017-01-12 (×3): qty 1

## 2017-01-12 MED ORDER — VANCOMYCIN HCL 10 G IV SOLR
1250.0000 mg | Freq: Two times a day (BID) | INTRAVENOUS | Status: DC
  Administered 2017-01-12 – 2017-01-13 (×2): 1250 mg via INTRAVENOUS
  Filled 2017-01-12 (×3): qty 1250

## 2017-01-12 MED ORDER — SENNA 8.6 MG PO TABS
1.0000 | ORAL_TABLET | Freq: Two times a day (BID) | ORAL | Status: DC
  Administered 2017-01-12 – 2017-01-13 (×3): 8.6 mg via ORAL
  Filled 2017-01-12 (×3): qty 1

## 2017-01-12 MED ORDER — ACETAMINOPHEN 650 MG RE SUPP: 650.0000 mg | Freq: Four times a day (QID) | RECTAL | Status: DC | PRN

## 2017-01-12 MED ORDER — KCL IN DEXTROSE-NACL 10-5-0.45 MEQ/L-%-% IV SOLN
INTRAVENOUS | Status: DC
  Administered 2017-01-12: 15:00:00 via INTRAVENOUS
  Filled 2017-01-12 (×2): qty 1000

## 2017-01-12 MED ORDER — INSULIN ASPART PROT & ASPART (70-30 MIX) 100 UNIT/ML ~~LOC~~ SUSP: 12.0000 [IU] | Freq: Two times a day (BID) | SUBCUTANEOUS | Status: DC

## 2017-01-12 MED ORDER — METOCLOPRAMIDE HCL 5 MG PO TABS: 5.0000 mg | ORAL_TABLET | Freq: Three times a day (TID) | ORAL | Status: DC | PRN

## 2017-01-12 SURGICAL SUPPLY — 44 items
BLADE LONG MED 31MMX9MM (MISCELLANEOUS) ×1
BLADE LONG MED 31X9 (MISCELLANEOUS) ×2 IMPLANT
BNDG COHESIVE 4X5 TAN STRL (GAUZE/BANDAGES/DRESSINGS) ×3 IMPLANT
BNDG COHESIVE 6X5 TAN STRL LF (GAUZE/BANDAGES/DRESSINGS) ×3 IMPLANT
BNDG ESMARK 4X9 LF (GAUZE/BANDAGES/DRESSINGS) ×3 IMPLANT
BNDG GAUZE ELAST 4 BULKY (GAUZE/BANDAGES/DRESSINGS) ×3 IMPLANT
CANISTER SUCT 3000ML PPV (MISCELLANEOUS) ×3 IMPLANT
CHLORAPREP W/TINT 26ML (MISCELLANEOUS) ×3 IMPLANT
CUFF TOURNIQUET SINGLE 34IN LL (TOURNIQUET CUFF) IMPLANT
CUFF TOURNIQUET SINGLE 44IN (TOURNIQUET CUFF) IMPLANT
DRAPE U-SHAPE 47X51 STRL (DRAPES) ×6 IMPLANT
DRSG MEPITEL 4X7.2 (GAUZE/BANDAGES/DRESSINGS) ×3 IMPLANT
ELECT REM PT RETURN 9FT ADLT (ELECTROSURGICAL) ×3
ELECTRODE REM PT RTRN 9FT ADLT (ELECTROSURGICAL) ×1 IMPLANT
GAUZE SPONGE 4X4 12PLY STRL (GAUZE/BANDAGES/DRESSINGS) ×3 IMPLANT
GLOVE BIO SURGEON STRL SZ8 (GLOVE) ×6 IMPLANT
GLOVE BIOGEL PI IND STRL 8 (GLOVE) ×2 IMPLANT
GLOVE BIOGEL PI INDICATOR 8 (GLOVE) ×4
GLOVE ECLIPSE 8.0 STRL XLNG CF (GLOVE) ×3 IMPLANT
GOWN STRL REUS W/ TWL LRG LVL3 (GOWN DISPOSABLE) ×1 IMPLANT
GOWN STRL REUS W/ TWL XL LVL3 (GOWN DISPOSABLE) ×2 IMPLANT
GOWN STRL REUS W/TWL LRG LVL3 (GOWN DISPOSABLE) ×2
GOWN STRL REUS W/TWL XL LVL3 (GOWN DISPOSABLE) ×4
KIT BASIN OR (CUSTOM PROCEDURE TRAY) ×3 IMPLANT
KIT ROOM TURNOVER OR (KITS) ×3 IMPLANT
NS IRRIG 1000ML POUR BTL (IV SOLUTION) ×3 IMPLANT
PACK ORTHO EXTREMITY (CUSTOM PROCEDURE TRAY) ×3 IMPLANT
PAD ABD 8X10 STRL (GAUZE/BANDAGES/DRESSINGS) ×3 IMPLANT
PAD ARMBOARD 7.5X6 YLW CONV (MISCELLANEOUS) ×6 IMPLANT
PAD CAST 4YDX4 CTTN HI CHSV (CAST SUPPLIES) ×1 IMPLANT
PADDING CAST COTTON 4X4 STRL (CAST SUPPLIES) ×2
SPECIMEN JAR SMALL (MISCELLANEOUS) ×3 IMPLANT
SPONGE LAP 18X18 X RAY DECT (DISPOSABLE) IMPLANT
STAPLER VISISTAT 35W (STAPLE) IMPLANT
STOCKINETTE IMPERVIOUS LG (DRAPES) IMPLANT
SUCTION FRAZIER HANDLE 10FR (MISCELLANEOUS)
SUCTION TUBE FRAZIER 10FR DISP (MISCELLANEOUS) IMPLANT
SUT ETHILON 2 0 PSLX (SUTURE) ×6 IMPLANT
SUT MON AB 2-0 CT1 36 (SUTURE) ×3 IMPLANT
TOWEL OR 17X26 10 PK STRL BLUE (TOWEL DISPOSABLE) ×3 IMPLANT
TUBE CONNECTING 12'X1/4 (SUCTIONS)
TUBE CONNECTING 12X1/4 (SUCTIONS) IMPLANT
UNDERPAD 30X30 (UNDERPADS AND DIAPERS) ×3 IMPLANT
WATER STERILE IRR 1000ML POUR (IV SOLUTION) ×3 IMPLANT

## 2017-01-12 NOTE — Transfer of Care (Signed)
Immediate Anesthesia Transfer of Care Note  Patient: Derrick Mosley  Procedure(s) Performed: Procedure(s): Right fifth Ray  amputation (Right)  Patient Location: PACU  Anesthesia Type:General  Level of Consciousness: awake, alert , oriented and patient cooperative  Airway & Oxygen Therapy: Patient Spontanous Breathing and Patient connected to nasal cannula oxygen  Post-op Assessment: Report given to RN and Post -op Vital signs reviewed and stable  Post vital signs: Reviewed and stable  Last Vitals:  Vitals:   01/11/17 2119 01/12/17 0621  BP: 140/90 140/90  Pulse: 83 83  Resp: 16 16  Temp: 36.9 C 36.9 C    Last Pain:  Vitals:   01/12/17 0621  TempSrc: Oral  PainSc:          Complications: no anesthesia complications
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
## 2017-01-12 NOTE — Anesthesia Procedure Notes (Signed)
Procedure Name: LMA Insertion Date/Time: 01/12/2017 8:23 AM Performed by: Shirlyn Goltz Pre-anesthesia Checklist: Patient identified, Suction available, Emergency Drugs available and Patient being monitored Patient Re-evaluated:Patient Re-evaluated prior to inductionOxygen Delivery Method: Circle system utilized Preoxygenation: Pre-oxygenation with 100% oxygen Intubation Type: IV induction Ventilation: Mask ventilation without difficulty LMA: LMA flexible inserted LMA Size: 5.0 Number of attempts: 1 Placement Confirmation: positive ETCO2 and breath sounds checked- equal and bilateral Tube secured with: Tape Dental Injury: Teeth and Oropharynx as per pre-operative assessment

## 2017-01-12 NOTE — Progress Notes (Signed)
Orthopedic Tech Progress Note Patient Details:  Derrick Mosley Mar 28, 1976 [id number]  Ortho Devices Type of Ortho Device: Postop shoe/boot Ortho Device/Splint Location: rle Ortho Device/Splint Interventions: Application   Hildred Priest 01/12/2017, 12:58 PM

## 2017-01-12 NOTE — Anesthesia Procedure Notes (Signed)
Anesthesia Regional Block: Popliteal block   Pre-Anesthetic Checklist: ,, timeout performed, Correct Patient, Correct Site, Correct Laterality, Correct Procedure, Correct Position, site marked, Risks and benefits discussed,  Surgical consent,  Pre-op evaluation,  At surgeon's request and post-op pain management  Laterality: Right  Prep: chloraprep       Needles:  Injection technique: Single-shot  Needle Type: Echogenic Stimulator Needle          Additional Needles:   Procedures: ultrasound guided,,,,,,,,  Narrative:  Start time: 01/12/2017 7:43 AM End time: 01/12/2017 7:56 AM Injection made incrementally with aspirations every 5 mL.  Performed by: Personally  Anesthesiologist: Duane Boston  Additional Notes: A functioning IV was confirmed and monitors were applied.  Sterile prep and drape, hand hygiene and sterile gloves were used.  Negative aspiration and test dose prior to incremental administration of local anesthetic. The patient tolerated the procedure well.Ultrasound  guidance: relevant anatomy identified, needle position confirmed, local anesthetic spread visualized around nerve(s), vascular puncture avoided.  Image printed for medical record. Adductor canal block in addition.

## 2017-01-12 NOTE — Brief Op Note (Signed)
01/11/2017 - 01/12/2017  9:13 AM  PATIENT:  Derrick Mosley  41 y.o. male  PRE-OPERATIVE DIAGNOSIS:  Right foot diabetic ulcer and 5th MT osteomyelitis  POST-OPERATIVE DIAGNOSIS:  same  Procedure(s):  Right fifth Ray  amputation  SURGEON:  Wylene Simmer, MD  ASSISTANT: n/a  ANESTHESIA:   General, regional  EBL:  minimal   TOURNIQUET:  10 min with ankle esmarch  COMPLICATIONS:  None apparent  DISPOSITION:  Extubated, awake and stable to recovery.  DICTATION ID:  [id number]

## 2017-01-12 NOTE — Discharge Instructions (Signed)
Derrick Simmer, MD [place]  Please read the following information regarding your care after surgery.  Medications  You only need a prescription for the narcotic pain medicine (ex. oxycodone, Percocet, Norco).  All of the other medicines listed below are available over the counter. X acetominophen (Tylenol) 650 mg every 4-6 hours as you need for pain X oxycodone as prescribed for severe pain  Narcotic pain medicine (ex. oxycodone, Percocet, Vicodin) will cause constipation.  To prevent this problem, take the following medicines while you are taking any pain medicine. X docusate sodium (Colace) 100 mg twice a day X senna (Senokot) 2 tablets twice a day  Weight Bearing ? Bear weight when you are able on your operated leg or foot. X Bear weight only on the heel of your operated foot in the post-op shoe. ? Do not bear any weight on the operated leg or foot.  Cast / Splint / Dressing X Keep your dressing clean and dry.  Dont put anything (coat hanger, pencil, etc) down inside of it.  If it gets damp, use a hair dryer on the cool setting to dry it.  If it gets soaked, call the office to schedule an appointment for a dressing change. ? Remove your dressing 3 days after surgery and cover the incisions with dry dressings.    After your dressing, cast or splint is removed; you may shower, but do not soak or scrub the wound.  Allow the water to run over it, and then gently pat it dry.  Swelling It is normal for you to have swelling where you had surgery.  To reduce swelling and pain, keep your toes above your nose for at least 3 days after surgery.  It may be necessary to keep your foot or leg elevated for several weeks.  If it hurts, it should be elevated.  Follow Up Call my office at [phone] when you are discharged from the hospital or surgery center to schedule an appointment to be seen two weeks after surgery.  Call my office at [phone] if you develop a fever >101.5 F,  nausea, vomiting, bleeding from the surgical site or severe pain.

## 2017-01-12 NOTE — Op Note (Signed)
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  PROCEDURE:  Right fifth ray amputation.  SURGEON:  Wylene Simmer, MD  ANESTHESIA:  General, regional.  ESTIMATED BLOOD LOSS:  Minimal.  TOURNIQUET TIME:  10 minutes with an ankle Esmarch.  COMPLICATIONS:  None apparent.  DISPOSITION:  Extubated, awake, and stable to Recovery.  INDICATIONS FOR PROCEDURE:  The patient is a 41 year old male who has a past medical history significant for poorly controlled diabetes.  He has had an ulcer at the right lateral plantar forefoot for the last couple of years.  He has had 2 previous surgeries for debridement of this wound.  Most recently, this was done in late 2017 at [facility]. He says it has been persistently draining since then.  Two days ago, he noticed increased swelling, drainage, and foul smell from the wound.  He was admitted and an MRI obtained.  This shows osteomyelitis of the fifth toe and fifth metatarsal as well as pyarthrosis of the fifth MTP joint. He presents now for right foot fifth ray amputation and possible fourth ray amputation.  He understands the risks and benefits of the alternative treatment options and elects surgical treatment.  He specifically understands risks of bleeding, infection, nerve damage, blood clots, need for additional surgery, continued pain, revision, amputation, and death.  PROCEDURE IN DETAIL:  After preoperative consent was obtained and the correct operative site was identified, the patient was  brought to the operating room and placed supine on the operating table.  General anesthesia was induced.  Preoperative antibiotics were administered. Surgical time-out was taken.  The right lower extremity was prepped and draped in standard sterile fashion.  Foot was exsanguinated and a 4-inch Esmarch tourniquet wrapped around the ankle.  A racket-style incision was marked on the skin.  This was marked around the base of the fifth toe.  The incision was made to incorporate the entirety of the plantar ulcer.  The incision was made and dissection was carried down through the skin and subcutaneous tissues at the level of the fifth metatarsal. Subperiosteal dissection was then carried around the metatarsal circumferentially.  The oscillating saw was used to cut the metatarsal at approximately the junction of the proximal and middle thirds.  The fifth ray was then removed in its entirety.  The remaining soft tissues adjacent to the fourth metatarsal and dorsally and plantarly appeared generally healthy with no signs of necrosis or purulence.  The wound was irrigated copiously.  The tourniquet was released.  Hemostasis was achieved.  Deep subcutaneous tissues were approximated with simple sutures of 2-0 Monocryl.  The skin incision was then closed with simple and horizontal mattress sutures of 2-0 nylon.  Sterile dressings were applied followed by compression  wrap.  The patient was then awakened from anesthesia and transported to the recovery room in stable condition.  FOLLOWUP PLAN:  The patient will be weightbearing as tolerated on his right foot in a flat postop shoe.  He is going to follow up with me in the office in 2 weeks for a wound check and possible suture removal.  He will likely need a course of oral antibiotics for approximately 2 weeks.     Wylene Simmer, MD     JH/MEDQ  D:  01/12/2017  T:  01/12/2017  Job:  [id number]

## 2017-01-12 NOTE — Anesthesia Postprocedure Evaluation (Addendum)
Anesthesia Post Note  Patient: Derrick Mosley  Procedure(s) Performed: Procedure(s) (LRB): Right fifth Ray  amputation (Right)  Patient location during evaluation: PACU Anesthesia Type: General Level of consciousness: sedated Pain management: pain level controlled Vital Signs Assessment: post-procedure vital signs reviewed and stable Respiratory status: spontaneous breathing and respiratory function stable Cardiovascular status: stable Anesthetic complications: no       Last Vitals:  Vitals:   01/12/17 0946 01/12/17 1034  BP: (!) 142/82 137/87  Pulse: 85 76  Resp: 16 17  Temp: 36.6 C 36.8 C    Last Pain:  Vitals:   01/12/17 0621  TempSrc: Oral  PainSc:                  Naylah Cork DANIEL

## 2017-01-12 NOTE — Anesthesia Preprocedure Evaluation (Addendum)
Anesthesia Evaluation  Patient identified by MRN, date of birth, ID band Patient awake    Reviewed: Allergy & Precautions, NPO status , Patient's Chart, lab work & pertinent test results  History of Anesthesia Complications Negative for: history of anesthetic complications  Airway Mallampati: II  TM Distance: >3 FB Neck ROM: Full    Dental no notable dental hx. (+) Dental Advisory Given   Pulmonary Current Smoker,    Pulmonary exam normal        Cardiovascular negative cardio ROS Normal cardiovascular exam     Neuro/Psych  Headaches, negative psych ROS   GI/Hepatic negative GI ROS, Neg liver ROS,   Endo/Other  diabetes  Renal/GU Renal InsufficiencyRenal diseasenegative Renal ROS  negative genitourinary   Musculoskeletal negative musculoskeletal ROS (+)   Abdominal   Peds negative pediatric ROS (+)  Hematology negative hematology ROS (+)   Anesthesia Other Findings   Reproductive/Obstetrics negative OB ROS                            Anesthesia Physical Anesthesia Plan  ASA: III  Anesthesia Plan: General   Post-op Pain Management: GA combined w/ Regional for post-op pain   Induction:   Airway Management Planned: LMA  Additional Equipment:   Intra-op Plan:   Post-operative Plan: Extubation in OR  Informed Consent: I have reviewed the patients History and Physical, chart, labs and discussed the procedure including the risks, benefits and alternatives for the proposed anesthesia with the patient or authorized representative who has indicated his/her understanding and acceptance.   Dental advisory given  Plan Discussed with: CRNA and Anesthesiologist  Anesthesia Plan Comments:        Anesthesia Quick Evaluation

## 2017-01-13 ENCOUNTER — Encounter (HOSPITAL_COMMUNITY): Payer: Self-pay | Admitting: Orthopedic Surgery

## 2017-01-13 LAB — BASIC METABOLIC PANEL
Anion gap: 5 (ref 5–15)
BUN: 12 mg/dL (ref 6–20)
CO2: 27 mmol/L (ref 22–32)
Calcium: 8.3 mg/dL — ABNORMAL LOW (ref 8.9–10.3)
Chloride: 103 mmol/L (ref 101–111)
Creatinine, Ser: 1.52 mg/dL — ABNORMAL HIGH (ref 0.61–1.24)
GFR calc Af Amer: >60 mL/min (ref >60)
GFR, EST NON AFRICAN AMERICAN: 56 mL/min — AB (ref >60)
Glucose, Bld: 151 mg/dL — ABNORMAL HIGH (ref 65–99)
POTASSIUM: 4.3 mmol/L (ref 3.5–5.1)
SODIUM: 135 mmol/L (ref 135–145)

## 2017-01-13 LAB — GLUCOSE, CAPILLARY
GLUCOSE-CAPILLARY: 159 mg/dL — AB (ref 65–99)
GLUCOSE-CAPILLARY: 172 mg/dL — AB (ref 65–99)

## 2017-01-13 MED ORDER — OXYCODONE HCL 5 MG PO TABS: 5.0000 mg | ORAL_TABLET | ORAL | 0 refills | Status: DC | PRN

## 2017-01-13 MED ORDER — "INSULIN SYRINGE 30G X 1/2"" 1 ML MISC": 1.0000 | Freq: Two times a day (BID) | 0 refills | Status: AC

## 2017-01-13 MED ORDER — INSULIN NPH ISOPHANE & REGULAR (70-30) 100 UNIT/ML ~~LOC~~ SUSP: 15.0000 [IU] | Freq: Two times a day (BID) | SUBCUTANEOUS | 0 refills | Status: DC

## 2017-01-13 MED ORDER — CEPHALEXIN 500 MG PO CAPS: 500.0000 mg | ORAL_CAPSULE | Freq: Four times a day (QID) | ORAL | 0 refills | Status: DC

## 2017-01-13 MED ORDER — BLOOD GLUCOSE MONITOR KIT: PACK | 0 refills | Status: DC

## 2017-01-13 NOTE — Progress Notes (Signed)
   Subjective:  Patient reports pain as mild.  Wants to go home.  Objective:   VITALS:   Vitals:   01/12/17 1034 01/12/17 1408 01/12/17 2200 01/13/17 0026  BP: 137/87 131/76 126/79 130/83  Pulse: 76 86 88 76  Resp: 17 18    Temp: 98.2 F (36.8 C) 98.5 F (36.9 C) 99.2 F (37.3 C) 98.2 F (36.8 C)  TempSrc:   Oral Oral  SpO2: 99% 99% 98% 99%  Weight:      Height:        NAD ABD soft Intact pulses distally Dorsiflexion/Plantar flexion intact Incision: dressing C/D/I   Lab Results  Component Value Date   WBC 9.1 01/12/2017   HGB 11.1 (L) 01/12/2017   HCT 32.5 (L) 01/12/2017   MCV 89.0 01/12/2017   PLT 261 01/12/2017   BMET    Component Value Date/Time   NA 135 01/13/2017 0619   K 4.3 01/13/2017 0619   CL 103 01/13/2017 0619   CO2 27 01/13/2017 0619   GLUCOSE 151 (H) 01/13/2017 0619   BUN 12 01/13/2017 0619   CREATININE 1.52 (H) 01/13/2017 0619   CREATININE 1.25 04/06/2015 1052   CALCIUM 8.3 (L) 01/13/2017 0619   GFRNONAA 56 (L) 01/13/2017 0619   GFRNONAA 73 04/06/2015 1052   GFRAA >60 01/13/2017 0619   GFRAA 84 04/06/2015 1052     Assessment/Plan: 1 Day Post-Op   Principal Problem:   Diabetic foot ulcer (HCC) Active Problems:   Type 2 diabetes mellitus with diabetic retinopathy, without macular edema, with moderate nonproliferative retinopathy (HCC)   CKD (chronic kidney disease)   Foot ulcer (HCC)   WBAT in postop shoe Keep dressing clean and dry, do not remove D/C home on 2 weeks of oral abx F/U with Dr. Doran Durand in 2 weeks   [place], Horald Pollen 01/13/2017, 9:21 AM   Rod Can, MD Cell [phone]
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
## 2017-01-14 LAB — AEROBIC CULTURE W GRAM STAIN (SUPERFICIAL SPECIMEN)

## 2017-01-14 LAB — AEROBIC CULTURE  (SUPERFICIAL SPECIMEN)

## 2017-01-16 LAB — CULTURE, BLOOD (ROUTINE X 2)
CULTURE: NO GROWTH
CULTURE: NO GROWTH

## 2017-01-29 LAB — HEMOGLOBIN A1C: Hemoglobin A1C: 9.3

## 2017-02-04 ENCOUNTER — Other Ambulatory Visit (HOSPITAL_COMMUNITY): Payer: Self-pay | Admitting: Physical Medicine and Rehabilitation

## 2017-02-04 DIAGNOSIS — M5136 Other intervertebral disc degeneration, lumbar region: Secondary | ICD-10-CM

## 2017-02-08 ENCOUNTER — Ambulatory Visit (HOSPITAL_COMMUNITY)
Admission: RE | Admit: 2017-02-08 | Discharge: 2017-02-08 | Disposition: A | Discharge status: Home / Self Care | Payer: Medicaid Other | Source: Ambulatory Visit | Attending: Physical Medicine and Rehabilitation | Admitting: Physical Medicine and Rehabilitation

## 2017-02-08 DIAGNOSIS — M5136 Other intervertebral disc degeneration, lumbar region: Secondary | ICD-10-CM | POA: Diagnosis present

## 2017-02-08 DIAGNOSIS — M5126 Other intervertebral disc displacement, lumbar region: Secondary | ICD-10-CM | POA: Diagnosis not present

## 2017-02-08 DIAGNOSIS — M48061 Spinal stenosis, lumbar region without neurogenic claudication: Secondary | ICD-10-CM | POA: Insufficient documentation

## 2017-02-26 ENCOUNTER — Ambulatory Visit: Payer: Medicaid Other | Admitting: "Endocrinology

## 2017-02-26 ENCOUNTER — Encounter: Payer: Self-pay | Admitting: "Endocrinology

## 2017-02-28 ENCOUNTER — Encounter: Payer: Self-pay | Admitting: "Endocrinology

## 2017-02-28 ENCOUNTER — Ambulatory Visit (INDEPENDENT_AMBULATORY_CARE_PROVIDER_SITE_OTHER): Payer: Medicaid Other | Admitting: "Endocrinology

## 2017-02-28 VITALS — BP 121/86 | HR 96 | Ht 71.0 in | Wt 205.0 lb

## 2017-02-28 DIAGNOSIS — E1165 Type 2 diabetes mellitus with hyperglycemia: Secondary | ICD-10-CM

## 2017-02-28 DIAGNOSIS — E1159 Type 2 diabetes mellitus with other circulatory complications: Secondary | ICD-10-CM

## 2017-02-28 DIAGNOSIS — IMO0002 Reserved for concepts with insufficient information to code with codable children: Secondary | ICD-10-CM

## 2017-02-28 DIAGNOSIS — Z794 Long term (current) use of insulin: Secondary | ICD-10-CM | POA: Diagnosis not present

## 2017-02-28 NOTE — Patient Instructions (Signed)
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
## 2017-03-04 ENCOUNTER — Encounter: Payer: Self-pay | Admitting: Podiatry

## 2017-03-04 ENCOUNTER — Ambulatory Visit (INDEPENDENT_AMBULATORY_CARE_PROVIDER_SITE_OTHER): Payer: Self-pay | Admitting: Podiatry

## 2017-03-04 VITALS — BP 165/108 | HR 98

## 2017-03-04 DIAGNOSIS — B351 Tinea unguium: Secondary | ICD-10-CM | POA: Diagnosis not present

## 2017-03-04 DIAGNOSIS — M2041 Other hammer toe(s) (acquired), right foot: Secondary | ICD-10-CM

## 2017-03-04 DIAGNOSIS — M2042 Other hammer toe(s) (acquired), left foot: Secondary | ICD-10-CM

## 2017-03-04 DIAGNOSIS — M79675 Pain in left toe(s): Secondary | ICD-10-CM

## 2017-03-04 DIAGNOSIS — M79676 Pain in unspecified toe(s): Secondary | ICD-10-CM

## 2017-03-04 DIAGNOSIS — L988 Other specified disorders of the skin and subcutaneous tissue: Secondary | ICD-10-CM

## 2017-03-04 DIAGNOSIS — M79674 Pain in right toe(s): Secondary | ICD-10-CM

## 2017-03-04 DIAGNOSIS — E1149 Type 2 diabetes mellitus with other diabetic neurological complication: Secondary | ICD-10-CM

## 2017-03-04 DIAGNOSIS — Z899 Acquired absence of limb, unspecified: Secondary | ICD-10-CM

## 2017-03-04 DIAGNOSIS — M2040 Other hammer toe(s) (acquired), unspecified foot: Secondary | ICD-10-CM

## 2017-03-04 NOTE — Progress Notes (Signed)
   Subjective:    Patient ID: Derrick Mosley, male    DOB: Mar 21, 1976, 41 y.o.   MRN: [id number]  HPI 41 year old male presents the office today questioning diabetic inserts with a filler for the right fifth toe. He previously underwent amputation of the right fifth toe in March of this year due to a nonhealing wound which she previously had 2 other surgeries for. He states he has not noticed any new wound present on his feet. He also states his nails are thick and discolored and he cannot trim them. He states he is diabetic and his last A1c was over 9. He has been diagnosed with neuropathy as well. Denies any claudication symptoms.   Review of Systems  Constitutional:       Sweating  HENT:       Ringing in ears  Eyes: Positive for itching.  Musculoskeletal: Positive for back pain and gait problem.  Skin:       Thick scabs  All other systems reviewed and are negative.      Objective:   Physical Exam General: AAO x3, NAD  Dermatological: Nails are hypertrophic, dystrophic, brittle, discolored, elongated 9. Tenderness to nails 1-5 bilaterally except for the right fifth toe which is previously been amputated. There is no open lesions identified. Hyperkeratotic tissue is present on the lateral aspect of the right foot on the incision from the prior surgery. Small pieces skin was loose and after debridement there is no underlying ulceration, drainage or any clinical signs of infection. The surgical site appears to be healed. On the left fifth toe and on the left fourth interspaces macerated tissue with superficial wound present on the left fifth digit.  Vascular: Dorsalis Pedis artery and Posterior Tibial artery pedal pulses are 2/4 bilateral with immedate capillary fill time. There is no pain with calf compression, swelling, warmth, erythema.   Neruologic: Sensation decreased with Derrel Nip monofilament.  Musculoskeletal: Previous amputation of the right fifth toe. Hammertoes are  present. Muscular strength 5/5 in all groups tested bilateral.  Gait: Unassisted, Nonantalgic.      Assessment & Plan:  41 year old male right fifth digit amputation requesting an insert/filler; symptomatic onychomycosis; pre-ulcerative lesion left fifth toe with interdigital maceration -Treatment options discussed including all alternatives, risks, and complications -Etiology of symptoms were discussed -Nail sharply debrided 9 without complications or bleeding. -Castellani's pain was applied to left fourth interspace. Recommended dry thoroughly between his toes ongoing basis. Monitoring of his symptoms of infection. If the wounds are not healed in the next 2 weeks to call the office follow-up. I'll seem in 4 weeks for a wound check or sooner if needed. Liliane Channel evaluated the patient's anus molded for custom inserts. Unfortunately given insurance cannot get diabetic shoes. -Daily foot inspection discussed.   Celesta Gentile, DPM

## 2017-03-06 ENCOUNTER — Encounter: Payer: Self-pay | Admitting: "Endocrinology

## 2017-03-06 ENCOUNTER — Ambulatory Visit: Payer: Medicaid Other | Admitting: "Endocrinology

## 2017-03-14 DIAGNOSIS — Z0289 Encounter for other administrative examinations: Secondary | ICD-10-CM

## 2017-03-21 ENCOUNTER — Ambulatory Visit (INDEPENDENT_AMBULATORY_CARE_PROVIDER_SITE_OTHER): Payer: Medicaid Other | Admitting: Podiatry

## 2017-03-21 ENCOUNTER — Other Ambulatory Visit: Payer: Medicaid Other

## 2017-03-21 ENCOUNTER — Encounter: Payer: Self-pay | Admitting: Podiatry

## 2017-03-21 DIAGNOSIS — E1149 Type 2 diabetes mellitus with other diabetic neurological complication: Secondary | ICD-10-CM

## 2017-03-21 DIAGNOSIS — Z899 Acquired absence of limb, unspecified: Secondary | ICD-10-CM

## 2017-03-22 ENCOUNTER — Other Ambulatory Visit: Payer: Medicaid Other

## 2017-03-27 NOTE — Progress Notes (Signed)
Subjective: 41 year old male presents the office for follow-up evaluation and to pick up orthotics. Unfortunately his orthotics are currently in transit and not yet arrived however he is to come back later today. Otherwise he presents a for follow-up evaluation of his right foot. He states he is doing well and said no redness or drainage or any swelling Denies any systemic complaints such as fevers, chills, nausea, vomiting. No acute changes since last appointment, and no other complaints at this time.   Objective: AAO x3, NAD DP/PT pulses palpable bilaterally, CRT less than 3 seconds Minimal hyperkeratotic tissue is present along the incision on the lateral aspect of the right foot along the area previous amputation. After debridement the incision appears to be healed and there is no open sore, drainage or any other signs of infection. Previous partial fifth ray amputation. No open lesions or pre-ulcerative lesions.  No pain with calf compression, swelling, warmth, erythema  Assessment: Doing well right foot  Plan: -All treatment options discussed with the patient including all alternatives, risks, complications.  -He did come back later today or tomorrow to pick up his inserts. He'll see Liliane Channel for this. -I debrided the hyperkeratotic lesion on the area and incision without any complications or bleeding. Follow-up with Dr. Doran Durand as well. He has an appointment this week with him. He is hoping to get released -RTC 9 weeks for routine care or sooner if needed.  -Patient encouraged to call the office with any questions, concerns, change in symptoms.   Celesta Gentile, DPM

## 2017-03-30 NOTE — Addendum Note (Signed)
Addendum  created 03/30/17 1107 by Duane Boston, MD   Sign clinical note

## 2017-04-02 ENCOUNTER — Ambulatory Visit: Payer: Medicaid Other | Admitting: Nutrition

## 2017-04-02 ENCOUNTER — Telehealth: Payer: Self-pay | Admitting: Nutrition

## 2017-04-02 NOTE — Telephone Encounter (Signed)
VM to call and reschedule missed appt. 

## 2017-04-18 ENCOUNTER — Ambulatory Visit (INDEPENDENT_AMBULATORY_CARE_PROVIDER_SITE_OTHER): Payer: Medicaid Other | Admitting: Podiatry

## 2017-04-18 ENCOUNTER — Encounter: Payer: Self-pay | Admitting: Podiatry

## 2017-04-18 DIAGNOSIS — M79676 Pain in unspecified toe(s): Secondary | ICD-10-CM | POA: Diagnosis not present

## 2017-04-18 DIAGNOSIS — M79674 Pain in right toe(s): Secondary | ICD-10-CM

## 2017-04-18 DIAGNOSIS — M79675 Pain in left toe(s): Secondary | ICD-10-CM

## 2017-04-18 DIAGNOSIS — B351 Tinea unguium: Secondary | ICD-10-CM

## 2017-04-18 DIAGNOSIS — E1149 Type 2 diabetes mellitus with other diabetic neurological complication: Secondary | ICD-10-CM

## 2017-04-22 NOTE — Progress Notes (Signed)
Subjective: 41 year old male presents the office they for follow-up evaluation after diabetic inserts were dispensed. He states they're fitting well. Only thing is that he has noticed on the left foot was assessed metatarsal 5 he can feel some pressure to this area. Denies any skin breakdown. He states his nails are thickened elongated cannot trim himself because irritation in shoes. Denies any drainage or pus around the toenail sites. Denies any systemic complaints such as fevers, chills, nausea, vomiting. No acute changes since last appointment, and no other complaints at this time.   Objective: AAO x3, NAD DP/PT pulses palpable bilaterally, CRT less than 3 seconds Status post right fifth ray amputation. Hyperkeratotic tissue on the incision. Upon debridement the wound is healed incision is he'll. Is no underlying ulceration, drainage or clinical signs of infection. Nails hypertrophic, dystrophic, discolored, elongated 9. No swelling redness or drainage. Tenderness nails 1-5 bilaterally except for the right to the fifth toe which is been dictated. No significant hyperkeratotic tissue present left submetatarsal 5 however there does appear to be evidence of pressure to this area. No open lesions or pre-ulcerative lesions.  No pain with calf compression, swelling, warmth, erythema  Assessment: Symptomatic onychomycosis, diabetic with recent amputation of right fifth ray  Plan: -All treatment options discussed with the patient including all alternatives, risks, complications.  -Nail sharply debrided times nylon without, occasions or bleeding. -Evaluated the inserts and I had Rick modify the left side to metatarsal 5 to help take pressure off of this area.  -Daily foot inspection. -RTC as scheduled or sooner if needed. -Patient encouraged to call the office with any questions, concerns, change in symptoms.   Celesta Gentile, DPM

## 2017-05-24 ENCOUNTER — Ambulatory Visit: Payer: Medicaid Other | Admitting: Podiatry

## 2017-06-07 ENCOUNTER — Ambulatory Visit: Payer: Medicaid Other | Admitting: Podiatry

## 2017-06-10 ENCOUNTER — Ambulatory Visit (INDEPENDENT_AMBULATORY_CARE_PROVIDER_SITE_OTHER): Payer: Medicaid Other | Admitting: Podiatry

## 2017-06-10 ENCOUNTER — Encounter: Payer: Self-pay | Admitting: Podiatry

## 2017-06-10 DIAGNOSIS — E1149 Type 2 diabetes mellitus with other diabetic neurological complication: Secondary | ICD-10-CM

## 2017-06-10 MED ORDER — GABAPENTIN 100 MG PO CAPS: 100.0000 mg | ORAL_CAPSULE | Freq: Every day | ORAL | 3 refills | Status: DC

## 2017-06-10 NOTE — Patient Instructions (Signed)
Gabapentin capsules or tablets What is this medicine? GABAPENTIN (GA ba pen tin) is used to control partial seizures in adults with epilepsy. It is also used to treat certain types of nerve pain. This medicine may be used for other purposes; ask your health care provider or pharmacist if you have questions. COMMON BRAND NAME(S): Active-PAC with Gabapentin, Gabarone, Neurontin What should I tell my health care provider before I take this medicine? They need to know if you have any of these conditions: -kidney disease -suicidal thoughts, plans, or attempt; a previous suicide attempt by you or a family member -an unusual or allergic reaction to gabapentin, other medicines, foods, dyes, or preservatives -pregnant or trying to get pregnant -breast-feeding How should I use this medicine? Take this medicine by mouth with a glass of water. Follow the directions on the prescription label. You can take it with or without food. If it upsets your stomach, take it with food.Take your medicine at regular intervals. Do not take it more often than directed. Do not stop taking except on your doctor's advice. If you are directed to break the 600 or 800 mg tablets in half as part of your dose, the extra half tablet should be used for the next dose. If you have not used the extra half tablet within 28 days, it should be thrown away. A special MedGuide will be given to you by the pharmacist with each prescription and refill. Be sure to read this information carefully each time. Talk to your pediatrician regarding the use of this medicine in children. Special care may be needed. Overdosage: If you think you have taken too much of this medicine contact a poison control center or emergency room at once. NOTE: This medicine is only for you. Do not share this medicine with others. What if I miss a dose? If you miss a dose, take it as soon as you can. If it is almost time for your next dose, take only that dose. Do not  take double or extra doses. What may interact with this medicine? Do not take this medicine with any of the following medications: -other gabapentin products This medicine may also interact with the following medications: -alcohol -antacids -antihistamines for allergy, cough and cold -certain medicines for anxiety or sleep -certain medicines for depression or psychotic disturbances -homatropine; hydrocodone -naproxen -narcotic medicines (opiates) for pain -phenothiazines like chlorpromazine, mesoridazine, prochlorperazine, thioridazine This list may not describe all possible interactions. Give your health care provider a list of all the medicines, herbs, non-prescription drugs, or dietary supplements you use. Also tell them if you smoke, drink alcohol, or use illegal drugs. Some items may interact with your medicine. What should I watch for while using this medicine? Visit your doctor or health care professional for regular checks on your progress. You may want to keep a record at home of how you feel your condition is responding to treatment. You may want to share this information with your doctor or health care professional at each visit. You should contact your doctor or health care professional if your seizures get worse or if you have any new types of seizures. Do not stop taking this medicine or any of your seizure medicines unless instructed by your doctor or health care professional. Stopping your medicine suddenly can increase your seizures or their severity. Wear a medical identification bracelet or chain if you are taking this medicine for seizures, and carry a card that lists all your medications. You may get drowsy, dizzy,   or have blurred vision. Do not drive, use machinery, or do anything that needs mental alertness until you know how this medicine affects you. To reduce dizzy or fainting spells, do not sit or stand up quickly, especially if you are an older patient. Alcohol can  increase drowsiness and dizziness. Avoid alcoholic drinks. Your mouth may get dry. Chewing sugarless gum or sucking hard candy, and drinking plenty of water will help. The use of this medicine may increase the chance of suicidal thoughts or actions. Pay special attention to how you are responding while on this medicine. Any worsening of mood, or thoughts of suicide or dying should be reported to your health care professional right away. Women who become pregnant while using this medicine may enroll in the [place] Antiepileptic Drug Pregnancy Registry by calling 1-888-233-2334. This registry collects information about the safety of antiepileptic drug use during pregnancy. What side effects may I notice from receiving this medicine? Side effects that you should report to your doctor or health care professional as soon as possible: -allergic reactions like skin rash, itching or hives, swelling of the face, lips, or tongue -worsening of mood, thoughts or actions of suicide or dying Side effects that usually do not require medical attention (report to your doctor or health care professional if they continue or are bothersome): -constipation -difficulty walking or controlling muscle movements -dizziness -nausea -slurred speech -tiredness -tremors -weight gain This list may not describe all possible side effects. Call your doctor for medical advice about side effects. You may report side effects to FDA at 1-800-FDA-1088. Where should I keep my medicine? Keep out of reach of children. This medicine may cause accidental overdose and death if it taken by other adults, children, or pets. Mix any unused medicine with a substance like cat litter or coffee grounds. Then throw the medicine away in a sealed container like a sealed bag or a coffee can with a lid. Do not use the medicine after the expiration date. Store at room temperature between 15 and 30 degrees C (59 and 86 degrees F). NOTE: This  sheet is a summary. It may not cover all possible information. If you have questions about this medicine, talk to your doctor, pharmacist, or health care provider.  2018 Elsevier/Gold Standard (2013-12-11 15:26:50)  

## 2017-06-12 NOTE — Progress Notes (Signed)
Subjective: 41 y.o. returns the office today for painful, elongated, thickened toenails which he cannot trim himself. Denies any redness or drainage around the nails. He also states his been using moisturizer on the right foot status post amputation and the callus/scab has fallen off and doing much better. Denies any open sores or any drainage.Denies any acute changes since last appointment and no new complaints today. Denies any systemic complaints such as fevers, chills, nausea, vomiting.   Objective: AAO 3, NAD DP/PT pulses palpable, CRT less than 3 seconds Protective sensation decreased with Simms Weinstein monofilament Nails hypertrophic, dystrophic, elongated, brittle, discolored 10. There is tenderness overlying the nails 1-5 bilaterally. There is no surrounding erythema or drainage along the nail sites. No open lesions or pre-ulcerative lesions are identified. No other areas of tenderness bilateral lower extremities. No overlying edema, erythema, increased warmth. No pain with calf compression, swelling, warmth, erythema.  Assessment: Patient presents with symptomatic onychomycosis;   Plan: -Treatment options including alternatives, risks, complications were discussed -Nails sharply debrided 10 without complication/bleeding. -He states the inserts for the diabetic shoes have a smell and he talked to [facility] and he is going to  bring them back and he is going to add something he states.  -Discussed daily foot inspection. If there are any changes, to call the office immediately.  -Follow-up in 9 weeks at this point or sooner if any problems are to arise. In the meantime, encouraged to call the office with any questions, concerns, changes symptoms.  Celesta Gentile, DPM

## 2017-06-13 ENCOUNTER — Other Ambulatory Visit: Payer: Medicaid Other | Admitting: Orthotics

## 2017-06-13 ENCOUNTER — Encounter: Payer: Self-pay | Admitting: Podiatry

## 2017-06-19 ENCOUNTER — Other Ambulatory Visit: Payer: Self-pay | Admitting: "Endocrinology

## 2017-06-19 DIAGNOSIS — E113399 Type 2 diabetes mellitus with moderate nonproliferative diabetic retinopathy without macular edema, unspecified eye: Secondary | ICD-10-CM

## 2017-07-05 ENCOUNTER — Encounter: Payer: Self-pay | Admitting: "Endocrinology

## 2017-07-05 ENCOUNTER — Other Ambulatory Visit: Payer: Self-pay | Admitting: "Endocrinology

## 2017-07-05 LAB — BASIC METABOLIC PANEL
BUN: 17 (ref 4–21)
CREATININE: 1.6 — AB (ref <=1.3)

## 2017-07-05 LAB — HEMOGLOBIN A1C: Hemoglobin A1C: 13.6

## 2017-07-08 ENCOUNTER — Ambulatory Visit (INDEPENDENT_AMBULATORY_CARE_PROVIDER_SITE_OTHER): Payer: Medicaid Other | Admitting: "Endocrinology

## 2017-07-08 ENCOUNTER — Encounter: Payer: Self-pay | Admitting: "Endocrinology

## 2017-07-08 VITALS — BP 130/83 | HR 92 | Ht 71.0 in | Wt 213.0 lb

## 2017-07-08 DIAGNOSIS — Z794 Long term (current) use of insulin: Secondary | ICD-10-CM

## 2017-07-08 DIAGNOSIS — F17200 Nicotine dependence, unspecified, uncomplicated: Secondary | ICD-10-CM | POA: Diagnosis not present

## 2017-07-08 DIAGNOSIS — Z91199 Patient's noncompliance with other medical treatment and regimen due to unspecified reason: Secondary | ICD-10-CM

## 2017-07-08 DIAGNOSIS — E1165 Type 2 diabetes mellitus with hyperglycemia: Secondary | ICD-10-CM | POA: Diagnosis not present

## 2017-07-08 DIAGNOSIS — IMO0002 Reserved for concepts with insufficient information to code with codable children: Secondary | ICD-10-CM

## 2017-07-08 DIAGNOSIS — Z9119 Patient's noncompliance with other medical treatment and regimen: Secondary | ICD-10-CM | POA: Insufficient documentation

## 2017-07-08 DIAGNOSIS — E1159 Type 2 diabetes mellitus with other circulatory complications: Secondary | ICD-10-CM

## 2017-07-08 LAB — RENAL PROFILE WITH ESTIMATED GFR
Albumin: 3.8 g/dL (ref 3.6–5.1)
BUN / CREAT RATIO: 10 (calc) (ref 6–22)
BUN: 17 mg/dL (ref 7–25)
CHLORIDE: 98 mmol/L (ref 98–110)
CO2: 25 mmol/L (ref 20–32)
Calcium: 9.4 mg/dL (ref 8.6–10.3)
Creat: 1.64 mg/dL — ABNORMAL HIGH (ref 0.60–1.35)
GFR, EST AFRICAN AMERICAN: 60 mL/min/{1.73_m2} (ref >=60)
GFR, Est Non African American: 52 mL/min/{1.73_m2} — ABNORMAL LOW (ref >=60)
GLUCOSE: 418 mg/dL — AB (ref 65–99)
Phosphorus: 4 mg/dL (ref 2.5–4.5)
Potassium: 5.1 mmol/L (ref 3.5–5.3)
Sodium: 132 mmol/L — ABNORMAL LOW (ref 135–146)

## 2017-07-08 LAB — HEMOGLOBIN A1C W/OUT EAG: Hgb A1c MFr Bld: 13.6 % of total Hgb — ABNORMAL HIGH (ref <5.7)

## 2017-07-08 MED ORDER — INSULIN GLARGINE 100 UNIT/ML SOLOSTAR PEN: 30.0000 [IU] | PEN_INJECTOR | Freq: Every day | SUBCUTANEOUS | 2 refills | Status: DC

## 2017-07-08 MED ORDER — INSULIN PEN NEEDLE 31G X 8 MM MISC: 1.0000 | 3 refills | Status: DC
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
## 2017-07-08 NOTE — Patient Instructions (Signed)

## 2017-07-16 ENCOUNTER — Ambulatory Visit: Payer: Medicaid Other | Admitting: "Endocrinology

## 2017-07-16 ENCOUNTER — Encounter: Payer: Self-pay | Admitting: "Endocrinology

## 2017-08-12 ENCOUNTER — Encounter: Payer: Self-pay | Admitting: Podiatry

## 2017-08-12 ENCOUNTER — Ambulatory Visit: Payer: Medicaid Other | Admitting: Podiatry

## 2017-08-12 ENCOUNTER — Ambulatory Visit (INDEPENDENT_AMBULATORY_CARE_PROVIDER_SITE_OTHER): Payer: Medicaid Other | Admitting: Podiatry

## 2017-08-12 DIAGNOSIS — B351 Tinea unguium: Secondary | ICD-10-CM

## 2017-08-12 DIAGNOSIS — M79674 Pain in right toe(s): Secondary | ICD-10-CM

## 2017-08-12 DIAGNOSIS — M79676 Pain in unspecified toe(s): Secondary | ICD-10-CM

## 2017-08-12 DIAGNOSIS — M79675 Pain in left toe(s): Principal | ICD-10-CM

## 2017-08-13 NOTE — Progress Notes (Signed)
Subjective: 41 y.o. returns the office today for painful, elongated, thickened toenails which he cannot trim himself. Denies any redness or drainage around the nails. He has not noticed any significant callus formation to his feet.incision from the prior amputation on the right foot is well-healed without any problems. Denies any acute changes since last appointment and no new complaints today. Denies any systemic complaints such as fevers, chills, nausea, vomiting.   Last A1c: 13.9 (encouaged compliance with medications) Endocrinologist: Dr. Dorris Fetch  Last seen: 07/08/2017  Objective: AAO 3, NAD DP/PT pulses palpable, CRT less than 3 seconds Protective sensation decreased with Simms Weinstein monofilament Nails hypertrophic, dystrophic, elongated, brittle, discolored 9. There is tenderness overlying the nails 1-5 bilaterally except the right 5th which has been amputated and the incision is well healed and there is no hyperkeratotic tissue. There is no surrounding erythema or drainage along the nail sites. No open lesions or pre-ulcerative lesions are identified. No other areas of tenderness bilateral lower extremities. No overlying edema, erythema, increased warmth. No pain with calf compression, swelling, warmth, erythema.  Assessment: Patient presents with symptomatic onychomycosis;   Plan: -Treatment options including alternatives, risks, complications were discussed -Nails sharply debrided 9 without complication/bleeding. -Discussed daily foot inspection. If there are any changes, to call the office immediately.  -Follow-up in 9 weeks at this point or sooner if any problems are to arise. In the meantime, encouraged to call the office with any questions, concerns, changes symptoms.  Celesta Gentile, DPM

## 2017-10-14 ENCOUNTER — Encounter: Payer: Self-pay | Admitting: Podiatry

## 2017-10-14 ENCOUNTER — Ambulatory Visit: Payer: Medicaid Other | Admitting: Podiatry

## 2017-10-14 DIAGNOSIS — M79676 Pain in unspecified toe(s): Secondary | ICD-10-CM | POA: Diagnosis not present

## 2017-10-14 DIAGNOSIS — L84 Corns and callosities: Secondary | ICD-10-CM

## 2017-10-14 DIAGNOSIS — B351 Tinea unguium: Secondary | ICD-10-CM | POA: Diagnosis not present

## 2017-10-14 DIAGNOSIS — E1149 Type 2 diabetes mellitus with other diabetic neurological complication: Secondary | ICD-10-CM

## 2017-10-14 DIAGNOSIS — M79675 Pain in left toe(s): Secondary | ICD-10-CM

## 2017-10-14 DIAGNOSIS — M79674 Pain in right toe(s): Secondary | ICD-10-CM

## 2017-10-14 DIAGNOSIS — I739 Peripheral vascular disease, unspecified: Secondary | ICD-10-CM

## 2017-10-14 NOTE — Progress Notes (Signed)
Subjective: 41 y.o. returns the office today for painful, elongated, thickened toenails which he cannot trim himself. Denies any redness or drainage around the nails.  He states that he is wearing some boots last week and this now he started get a corn to the right fourth toe.  Denies any drainage or pus or any swelling.  He also gets a callus the right big toe.  He has no other concerns today.  Denies any drainage or swelling to his feet.  Objective: AAO 3, NAD DP/PT pulses palpable, CRT less than 3 seconds Protective sensation decreased with Simms Weinstein monofilament Nails hypertrophic, dystrophic, elongated, brittle, discolored 9. There is tenderness overlying the nails 1-5 bilaterally except the right 5th which has been amputated. Amputation site is well-healed. Hyperkeratotic lesion of the dorsal right fourth toe and distal right hallux.  Upon debridement there is no underlying ulceration, drainage or any clinical signs of infection. No other open lesions or pre-ulcerative lesions are identified. No other areas of tenderness bilateral lower extremities. No overlying edema, erythema, increased warmth. No pain with calf compression, swelling, warmth, erythema.  Assessment: Patient presents with symptomatic onychomycosis; pre-ulcerative calluses with uncontrolled diabetes and neuropathy  Plan: -Treatment options including alternatives, risks, complications were discussed -Hyperkeratotic lesion sharply debrided x2 without any complications or bleeding. Offloading. -Nails sharply debrided 9 without complication/bleeding.  -Discussed daily foot inspection. If there are any changes, to call the office immediately.  -I did an ABI on the office which was normal. -Follow-up in 9 weeks at this point or sooner if any problems are to arise. In the meantime, encouraged to call the office with any questions, concerns, changes symptoms.  *Patient states that due to his feet with history of  neuropathy he cannot do his job he normally does.  He normally where he has had jobs that he has to wear steel toe shoes and boots.  Every time he goes into a boot he gets skin breakdown.  Because of this I think that given his neuropathy and history of amputation the wounds that he should avoid a job that requires steel toed shoes and excessive walking or standing.  He has applied for disability.  Celesta Gentile, DPM

## 2017-12-23 ENCOUNTER — Ambulatory Visit: Payer: Medicaid Other | Admitting: Podiatry

## 2017-12-23 ENCOUNTER — Encounter: Payer: Self-pay | Admitting: Podiatry

## 2017-12-23 DIAGNOSIS — Q6689 Other  specified congenital deformities of feet: Secondary | ICD-10-CM | POA: Diagnosis not present

## 2017-12-23 DIAGNOSIS — M79674 Pain in right toe(s): Secondary | ICD-10-CM

## 2017-12-23 DIAGNOSIS — M79676 Pain in unspecified toe(s): Secondary | ICD-10-CM | POA: Diagnosis not present

## 2017-12-23 DIAGNOSIS — M79675 Pain in left toe(s): Principal | ICD-10-CM

## 2017-12-23 DIAGNOSIS — E1149 Type 2 diabetes mellitus with other diabetic neurological complication: Secondary | ICD-10-CM

## 2017-12-23 DIAGNOSIS — B351 Tinea unguium: Secondary | ICD-10-CM

## 2017-12-23 DIAGNOSIS — L84 Corns and callosities: Secondary | ICD-10-CM

## 2017-12-23 MED ORDER — GABAPENTIN 100 MG PO CAPS: 100.0000 mg | ORAL_CAPSULE | Freq: Every day | ORAL | 3 refills | Status: DC

## 2017-12-23 NOTE — Patient Instructions (Signed)
Keep some pads over the 4th toe on your right foot to help prevent the the toe to open into a wound If it worsens before the next appointment give me a call at [phone] Monitor for any signs/symptoms of infection. Call the office immediately if any occur or go directly to the emergency room. Call with any questions/concerns.

## 2017-12-25 NOTE — Progress Notes (Signed)
Subjective: 42 y.o. returns the office today for painful, elongated, thickened toenails which he cannot trim himself. Denies any redness or drainage around the nails.  He states he still has a corn to the top of the right fourth toe but denies any increase in swelling or redness or any drainage.  Denies any drainage or pus or any swelling.  He also gets a callus the right big toe.  He has no other concerns today.  Denies any drainage or swelling to his feet.  Objective: AAO 3, NAD DP/PT pulses palpable, CRT less than 3 seconds Protective sensation decreased with Simms Weinstein monofilament Nails hypertrophic, dystrophic, elongated, brittle, discolored 9. There is tenderness overlying the nails 1-5 bilaterally except the right 5th which has been amputated. Amputation site is well-healed. Hyperkeratotic lesion of the dorsal right fourth toe and distal right hallux.  Upon debridement there is no underlying ulceration, drainage or any clinical signs of infection.  It appears to be more pre-ulcerative today but there is no definitive open sore to this area.  No other open lesions or pre-ulcerative lesions are identified. No other areas of tenderness bilateral lower extremities. No overlying edema, erythema, increased warmth. No pain with calf compression, swelling, warmth, erythema.  Assessment: Patient presents with symptomatic onychomycosis; pre-ulcerative calluses with uncontrolled diabetes and neuropathy  Plan: -Treatment options including alternatives, risks, complications were discussed -Hyperkeratotic lesion sharply debrided x1 without any complications or bleeding. Offloading at all times and dispensed offloading pads for him today. -Nails sharply debrided 9 without complication/bleeding.  -Discussed daily foot inspection. If there are any changes, to call the office immediately.  -Follow-up in 9 weeks at this point or sooner if any problems are to arise. In the meantime, encouraged to  call the office with any questions, concerns, changes symptoms.  Celesta Gentile, DPM

## 2018-01-01 DIAGNOSIS — M5020 Other cervical disc displacement, unspecified cervical region: Secondary | ICD-10-CM | POA: Insufficient documentation

## 2018-01-01 DIAGNOSIS — M5416 Radiculopathy, lumbar region: Secondary | ICD-10-CM | POA: Insufficient documentation

## 2018-01-13 ENCOUNTER — Ambulatory Visit: Payer: Medicaid Other | Admitting: Podiatry

## 2018-01-21 ENCOUNTER — Ambulatory Visit: Payer: Medicaid Other | Admitting: Podiatry

## 2018-01-26 ENCOUNTER — Inpatient Hospital Stay (HOSPITAL_COMMUNITY)
Admission: EM | Admit: 2018-01-26 | Discharge: 2018-01-31 | DRG: 247 | Disposition: A | Discharge status: Home / Self Care | Payer: Medicaid Other | Attending: Cardiology | Admitting: Cardiology

## 2018-01-26 ENCOUNTER — Other Ambulatory Visit: Payer: Self-pay

## 2018-01-26 ENCOUNTER — Inpatient Hospital Stay (HOSPITAL_COMMUNITY)
Admission: EM | Disposition: A | Discharge status: Home / Self Care | Payer: Self-pay | Source: Home / Self Care | Attending: Cardiology

## 2018-01-26 ENCOUNTER — Encounter (HOSPITAL_COMMUNITY): Payer: Self-pay

## 2018-01-26 DIAGNOSIS — Z89421 Acquired absence of other right toe(s): Secondary | ICD-10-CM

## 2018-01-26 DIAGNOSIS — Z8249 Family history of ischemic heart disease and other diseases of the circulatory system: Secondary | ICD-10-CM

## 2018-01-26 DIAGNOSIS — Z79899 Other long term (current) drug therapy: Secondary | ICD-10-CM | POA: Diagnosis not present

## 2018-01-26 DIAGNOSIS — Z794 Long term (current) use of insulin: Secondary | ICD-10-CM

## 2018-01-26 DIAGNOSIS — E1140 Type 2 diabetes mellitus with diabetic neuropathy, unspecified: Secondary | ICD-10-CM | POA: Diagnosis present

## 2018-01-26 DIAGNOSIS — T508X5A Adverse effect of diagnostic agents, initial encounter: Secondary | ICD-10-CM | POA: Diagnosis not present

## 2018-01-26 DIAGNOSIS — E1121 Type 2 diabetes mellitus with diabetic nephropathy: Secondary | ICD-10-CM | POA: Diagnosis present

## 2018-01-26 DIAGNOSIS — N183 Chronic kidney disease, stage 3 (moderate): Secondary | ICD-10-CM | POA: Diagnosis present

## 2018-01-26 DIAGNOSIS — N179 Acute kidney failure, unspecified: Secondary | ICD-10-CM | POA: Diagnosis present

## 2018-01-26 DIAGNOSIS — I129 Hypertensive chronic kidney disease with stage 1 through stage 4 chronic kidney disease, or unspecified chronic kidney disease: Secondary | ICD-10-CM | POA: Diagnosis present

## 2018-01-26 DIAGNOSIS — Z833 Family history of diabetes mellitus: Secondary | ICD-10-CM

## 2018-01-26 DIAGNOSIS — E1151 Type 2 diabetes mellitus with diabetic peripheral angiopathy without gangrene: Secondary | ICD-10-CM | POA: Diagnosis present

## 2018-01-26 DIAGNOSIS — I5030 Unspecified diastolic (congestive) heart failure: Secondary | ICD-10-CM

## 2018-01-26 DIAGNOSIS — E1122 Type 2 diabetes mellitus with diabetic chronic kidney disease: Secondary | ICD-10-CM | POA: Diagnosis present

## 2018-01-26 DIAGNOSIS — D631 Anemia in chronic kidney disease: Secondary | ICD-10-CM | POA: Diagnosis present

## 2018-01-26 DIAGNOSIS — N189 Chronic kidney disease, unspecified: Secondary | ICD-10-CM

## 2018-01-26 DIAGNOSIS — I2119 ST elevation (STEMI) myocardial infarction involving other coronary artery of inferior wall: Secondary | ICD-10-CM | POA: Diagnosis present

## 2018-01-26 DIAGNOSIS — Z955 Presence of coronary angioplasty implant and graft: Secondary | ICD-10-CM

## 2018-01-26 DIAGNOSIS — F17210 Nicotine dependence, cigarettes, uncomplicated: Secondary | ICD-10-CM | POA: Diagnosis present

## 2018-01-26 DIAGNOSIS — I213 ST elevation (STEMI) myocardial infarction of unspecified site: Secondary | ICD-10-CM | POA: Diagnosis present

## 2018-01-26 HISTORY — PX: CORONARY/GRAFT ACUTE MI REVASCULARIZATION: CATH118305

## 2018-01-26 HISTORY — PX: LEFT HEART CATH AND CORONARY ANGIOGRAPHY: CATH118249

## 2018-01-26 LAB — I-STAT CHEM 8, ED
BUN: 19 mg/dL (ref 6–20)
CREATININE: 1.8 mg/dL — AB (ref 0.61–1.24)
Calcium, Ion: 1.13 mmol/L — ABNORMAL LOW (ref 1.15–1.40)
Chloride: 95 mmol/L — ABNORMAL LOW (ref 101–111)
GLUCOSE: 363 mg/dL — AB (ref 65–99)
HCT: 38 % — ABNORMAL LOW (ref 39.0–52.0)
Hemoglobin: 12.9 g/dL — ABNORMAL LOW (ref 13.0–17.0)
POTASSIUM: 4.2 mmol/L (ref 3.5–5.1)
Sodium: 131 mmol/L — ABNORMAL LOW (ref 135–145)
TCO2: 26 mmol/L (ref 22–32)

## 2018-01-26 LAB — HEMOGLOBIN A1C
Hgb A1c MFr Bld: 12.3 % — ABNORMAL HIGH (ref 4.8–5.6)
MEAN PLASMA GLUCOSE: 306.31 mg/dL

## 2018-01-26 LAB — MRSA PCR SCREENING: MRSA by PCR: NEGATIVE

## 2018-01-26 LAB — POCT ACTIVATED CLOTTING TIME
ACTIVATED CLOTTING TIME: 164 s
ACTIVATED CLOTTING TIME: 158 s

## 2018-01-26 LAB — I-STAT TROPONIN, ED: Troponin i, poc: 15.75 ng/mL (ref 0.00–0.08)

## 2018-01-26 LAB — GLUCOSE, CAPILLARY
Glucose-Capillary: 380 mg/dL — ABNORMAL HIGH (ref 65–99)
Glucose-Capillary: 334 mg/dL — ABNORMAL HIGH (ref 65–99)

## 2018-01-26 LAB — TROPONIN I: TROPONIN I: 19.26 ng/mL — AB (ref <0.03)

## 2018-01-26 SURGERY — CORONARY/GRAFT ACUTE MI REVASCULARIZATION
Anesthesia: LOCAL

## 2018-01-26 MED ORDER — INSULIN GLARGINE 100 UNIT/ML ~~LOC~~ SOLN
20.0000 [IU] | Freq: Every day | SUBCUTANEOUS | Status: DC
  Administered 2018-01-26: 20 [IU] via SUBCUTANEOUS
  Filled 2018-01-26: qty 0.2

## 2018-01-26 MED ORDER — ACETAMINOPHEN 325 MG PO TABS
650.0000 mg | ORAL_TABLET | ORAL | Status: DC | PRN
  Administered 2018-01-27 – 2018-01-28 (×2): 650 mg via ORAL
  Filled 2018-01-26 (×2): qty 2

## 2018-01-26 MED ORDER — ASPIRIN 81 MG PO CHEW: 324.0000 mg | CHEWABLE_TABLET | ORAL | Status: DC

## 2018-01-26 MED ORDER — METOPROLOL TARTRATE 12.5 MG HALF TABLET
12.5000 mg | ORAL_TABLET | Freq: Two times a day (BID) | ORAL | Status: DC
  Administered 2018-01-26 – 2018-01-27 (×3): 12.5 mg via ORAL
  Filled 2018-01-26 (×3): qty 1

## 2018-01-26 MED ORDER — SODIUM CHLORIDE 0.9 % IV SOLN: 250.0000 mL | INTRAVENOUS | Status: DC | PRN

## 2018-01-26 MED ORDER — FENTANYL CITRATE (PF) 100 MCG/2ML IJ SOLN
INTRAMUSCULAR | Status: DC | PRN
  Administered 2018-01-26: 25 ug via INTRAVENOUS

## 2018-01-26 MED ORDER — SODIUM CHLORIDE 0.9% FLUSH: 3.0000 mL | INTRAVENOUS | Status: DC | PRN

## 2018-01-26 MED ORDER — TICAGRELOR 90 MG PO TABS
180.0000 mg | ORAL_TABLET | Freq: Once | ORAL | Status: AC
  Administered 2018-01-26: 180 mg via ORAL
  Filled 2018-01-26: qty 2

## 2018-01-26 MED ORDER — NITROGLYCERIN 1 MG/10 ML FOR IR/CATH LAB
INTRA_ARTERIAL | Status: DC | PRN
  Administered 2018-01-26: 150 ug via INTRACORONARY
  Administered 2018-01-26: 100 ug via INTRACORONARY

## 2018-01-26 MED ORDER — TIROFIBAN HCL IN NACL 5-0.9 MG/100ML-% IV SOLN
0.1500 ug/kg/min | INTRAVENOUS | Status: AC
  Administered 2018-01-26 (×2): 0.15 ug/kg/min via INTRAVENOUS
  Filled 2018-01-26 (×2): qty 100

## 2018-01-26 MED ORDER — ASPIRIN EC 81 MG PO TBEC
81.0000 mg | DELAYED_RELEASE_TABLET | Freq: Every day | ORAL | Status: DC
  Administered 2018-01-27 – 2018-01-31 (×5): 81 mg via ORAL
  Filled 2018-01-26 (×5): qty 1

## 2018-01-26 MED ORDER — TIROFIBAN (AGGRASTAT) BOLUS VIA INFUSION
INTRAVENOUS | Status: DC | PRN
  Administered 2018-01-26: 2382.5 ug via INTRAVENOUS

## 2018-01-26 MED ORDER — ASPIRIN 300 MG RE SUPP: 300.0000 mg | RECTAL | Status: DC

## 2018-01-26 MED ORDER — HEPARIN SODIUM (PORCINE) 5000 UNIT/ML IJ SOLN
4000.0000 [IU] | Freq: Once | INTRAMUSCULAR | Status: AC
  Administered 2018-01-26: 4000 [IU] via INTRAVENOUS

## 2018-01-26 MED ORDER — INSULIN ASPART 100 UNIT/ML ~~LOC~~ SOLN
0.0000 [IU] | Freq: Three times a day (TID) | SUBCUTANEOUS | Status: DC
  Administered 2018-01-26: 9 [IU] via SUBCUTANEOUS
  Administered 2018-01-27: 3 [IU] via SUBCUTANEOUS
  Administered 2018-01-27: 5 [IU] via SUBCUTANEOUS
  Administered 2018-01-28 (×2): 2 [IU] via SUBCUTANEOUS
  Administered 2018-01-29: 5 [IU] via SUBCUTANEOUS
  Administered 2018-01-29: 3 [IU] via SUBCUTANEOUS
  Administered 2018-01-30: 1 [IU] via SUBCUTANEOUS
  Administered 2018-01-30: 2 [IU] via SUBCUTANEOUS

## 2018-01-26 MED ORDER — TIROFIBAN HCL IN NACL 5-0.9 MG/100ML-% IV SOLN
INTRAVENOUS | Status: AC | PRN
  Administered 2018-01-26: 0.15 ug/kg/min via INTRAVENOUS

## 2018-01-26 MED ORDER — TICAGRELOR 90 MG PO TABS
90.0000 mg | ORAL_TABLET | Freq: Two times a day (BID) | ORAL | Status: DC
  Administered 2018-01-26 – 2018-01-31 (×10): 90 mg via ORAL
  Filled 2018-01-26 (×10): qty 1

## 2018-01-26 MED ORDER — SODIUM CHLORIDE 0.9 % IV SOLN
INTRAVENOUS | Status: AC | PRN
  Administered 2018-01-26: 10 mL/h via INTRAVENOUS
  Administered 2018-01-26: 250 mL
  Administered 2018-01-26: 100 mL/h via INTRAVENOUS
  Administered 2018-01-26: 250 mL

## 2018-01-26 MED ORDER — ONDANSETRON HCL 4 MG/2ML IJ SOLN
4.0000 mg | Freq: Once | INTRAMUSCULAR | Status: AC
  Administered 2018-01-26: 4 mg via INTRAVENOUS

## 2018-01-26 MED ORDER — HEPARIN (PORCINE) IN NACL 100-0.45 UNIT/ML-% IJ SOLN
1000.0000 [IU]/h | INTRAMUSCULAR | Status: DC
  Administered 2018-01-26: 1000 [IU]/h via INTRAVENOUS
  Filled 2018-01-26: qty 250

## 2018-01-26 MED ORDER — ATROPINE SULFATE 1 MG/10ML IJ SOSY
PREFILLED_SYRINGE | INTRAMUSCULAR | Status: AC
  Filled 2018-01-26: qty 10

## 2018-01-26 MED ORDER — IOPAMIDOL (ISOVUE-370) INJECTION 76%
INTRAVENOUS | Status: DC | PRN
  Administered 2018-01-26: 105 mL via INTRA_ARTERIAL

## 2018-01-26 MED ORDER — LIDOCAINE HCL (PF) 1 % IJ SOLN
INTRAMUSCULAR | Status: DC | PRN
  Administered 2018-01-26: 15 mL

## 2018-01-26 MED ORDER — ONDANSETRON HCL 4 MG/2ML IJ SOLN
4.0000 mg | Freq: Four times a day (QID) | INTRAMUSCULAR | Status: DC | PRN
  Administered 2018-01-26: 4 mg via INTRAVENOUS
  Filled 2018-01-26 (×2): qty 2

## 2018-01-26 MED ORDER — NITROGLYCERIN 0.4 MG SL SUBL
0.4000 mg | SUBLINGUAL_TABLET | SUBLINGUAL | Status: DC | PRN
  Administered 2018-01-26 – 2018-01-27 (×2): 0.4 mg via SUBLINGUAL
  Filled 2018-01-26 (×4): qty 1

## 2018-01-26 MED ORDER — SODIUM CHLORIDE 0.9 % IV SOLN
INTRAVENOUS | Status: DC | PRN
  Administered 2018-01-26: 1.75 mg/kg/h via INTRAVENOUS

## 2018-01-26 MED ORDER — ATORVASTATIN CALCIUM 80 MG PO TABS
80.0000 mg | ORAL_TABLET | Freq: Every day | ORAL | Status: DC
  Administered 2018-01-26 – 2018-01-30 (×5): 80 mg via ORAL
  Filled 2018-01-26 (×5): qty 1

## 2018-01-26 MED ORDER — IOHEXOL 350 MG/ML SOLN
INTRAVENOUS | Status: DC | PRN
  Administered 2018-01-26: 50 mL via INTRAVENOUS

## 2018-01-26 MED ORDER — SODIUM CHLORIDE 0.9 % IV SOLN
INTRAVENOUS | Status: DC
  Administered 2018-01-28 – 2018-01-30 (×4): via INTRAVENOUS

## 2018-01-26 MED ORDER — NITROGLYCERIN IN D5W 200-5 MCG/ML-% IV SOLN
5.0000 ug/min | INTRAVENOUS | Status: DC
  Administered 2018-01-26: 5 ug/min via INTRAVENOUS
  Filled 2018-01-26: qty 250

## 2018-01-26 MED ORDER — ASPIRIN 81 MG PO CHEW
324.0000 mg | CHEWABLE_TABLET | Freq: Once | ORAL | Status: AC
  Administered 2018-01-26: 324 mg via ORAL
  Filled 2018-01-26: qty 4

## 2018-01-26 MED ORDER — FAMOTIDINE IN NACL 20-0.9 MG/50ML-% IV SOLN
20.0000 mg | Freq: Every day | INTRAVENOUS | Status: DC
  Filled 2018-01-26: qty 50

## 2018-01-26 MED ORDER — SODIUM CHLORIDE 0.9% FLUSH
3.0000 mL | Freq: Two times a day (BID) | INTRAVENOUS | Status: DC
  Administered 2018-01-26 – 2018-01-31 (×7): 3 mL via INTRAVENOUS

## 2018-01-26 MED ORDER — GABAPENTIN 100 MG PO CAPS
100.0000 mg | ORAL_CAPSULE | Freq: Every day | ORAL | Status: DC
  Administered 2018-01-26 – 2018-01-30 (×5): 100 mg via ORAL
  Filled 2018-01-26 (×5): qty 1

## 2018-01-26 MED ORDER — ALUM & MAG HYDROXIDE-SIMETH 200-200-20 MG/5ML PO SUSP
30.0000 mL | Freq: Four times a day (QID) | ORAL | Status: DC | PRN
  Administered 2018-01-27 – 2018-01-28 (×3): 30 mL via ORAL
  Filled 2018-01-26 (×3): qty 30

## 2018-01-26 MED ORDER — MIDAZOLAM HCL 2 MG/2ML IJ SOLN
INTRAMUSCULAR | Status: DC | PRN
  Administered 2018-01-26: 1 mg via INTRAVENOUS

## 2018-01-26 MED ORDER — BIVALIRUDIN BOLUS VIA INFUSION - CUPID
INTRAVENOUS | Status: DC | PRN
  Administered 2018-01-26: 71.475 mg via INTRAVENOUS

## 2018-01-26 MED ORDER — SODIUM CHLORIDE 0.9 % IV SOLN
INTRAVENOUS | Status: AC
  Administered 2018-01-26: 14:00:00 via INTRAVENOUS

## 2018-01-26 MED ORDER — HEPARIN (PORCINE) IN NACL 2-0.9 UNIT/ML-% IJ SOLN
INTRAMUSCULAR | Status: AC | PRN
  Administered 2018-01-26 (×3): 500 mL

## 2018-01-26 SURGICAL SUPPLY — 23 items
BALLN EMERGE MR 2.0X15 (BALLOONS) ×2
BALLN EMERGE MR 2.5X15 (BALLOONS) ×2
BALLOON EMERGE MR 2.0X15 (BALLOONS) ×1 IMPLANT
BALLOON EMERGE MR 2.5X15 (BALLOONS) ×1 IMPLANT
CATH INFINITI 5 FR 3DRC (CATHETERS) ×2 IMPLANT
CATH INFINITI 5 FR RCB (CATHETERS) ×2 IMPLANT
CATH INFINITI 5FR MULTPACK ANG (CATHETERS) ×2 IMPLANT
CATH LAUNCHER 5F NOTO (CATHETERS) ×1 IMPLANT
CATH LAUNCHER 6FR EBU 3 (CATHETERS) ×2 IMPLANT
CATH VISTA GUIDE 6FR XB3.5 (CATHETERS) ×2 IMPLANT
CATHETER LAUNCHER 5F NOTO (CATHETERS) ×2
KIT ENCORE 26 ADVANTAGE (KITS) ×2 IMPLANT
KIT HEART LEFT (KITS) ×2 IMPLANT
KIT HEMO VALVE WATCHDOG (MISCELLANEOUS) IMPLANT
PACK CARDIAC CATHETERIZATION (CUSTOM PROCEDURE TRAY) ×2 IMPLANT
SHEATH AVANTI 11CM 6FR (SHEATH) ×2 IMPLANT
STENT SIERRA 2.75 X 28 MM (Permanent Stent) ×2 IMPLANT
STOPCOCK MORSE 400PSI 3WAY (MISCELLANEOUS) ×2 IMPLANT
TRANSDUCER W/STOPCOCK (MISCELLANEOUS) ×2 IMPLANT
TUBING CIL FLEX 10 FLL-RA (TUBING) ×2 IMPLANT
WIRE EMERALD 3MM-J .035X150CM (WIRE) ×2 IMPLANT
WIRE PT2 MS 185 (WIRE) ×2 IMPLANT
WIRE RUNTHROUGH .014X180CM (WIRE) ×2 IMPLANT
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
## 2018-01-26 NOTE — ED Notes (Signed)
CRITICAL VALUE ALERT  Critical Value:  Troponin 15.7  Date & Time Notied:  01/26/2018, 1056  Provider Notified: Dr. Thurnell Garbe  Orders Received/Actions taken: 1056, see chart

## 2018-01-26 NOTE — Progress Notes (Signed)
EKG CRITICAL VALUE     12 lead EKG performed.  Critical value noted.  Anthony Sar, RN notified.   Delfin Edis, [place] 01/26/2018 2:05 PM

## 2018-01-26 NOTE — ED Notes (Addendum)
[facility] called no trucks available for transport, called [facility], [place] will be transporting pt to cath lab.

## 2018-01-26 NOTE — Plan of Care (Signed)
Patient is doing well post cath. Blood pressure is under control and pain is minimal. The patient has a good appetite and has no nausea or vomiting. The patient has maintained a very good attitude and has been open to all conversations concerning his diagnosis, treatment, and care.

## 2018-01-26 NOTE — ED Notes (Signed)
EMS here for pt

## 2018-01-26 NOTE — ED Triage Notes (Signed)
Pt c/o abd pain n/v x 3 days and diarrhea today.  Pt says has pain in chest and shoulders worse when he moves or takes a deep breath.

## 2018-01-26 NOTE — ED Notes (Signed)
Per Dr. Thurnell Garbe, Dr. Terrence Dupont is accepting cardiologist.
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
## 2018-01-26 NOTE — Progress Notes (Signed)
R femoral sheath pulled at 2345.  Manual pressure held for 20 minutes. Vital signs remained stable throughout procedure. Groin site is a level 0 and pressure dressing applied. Patient has been educated.  Scheryl Darter RN

## 2018-01-27 ENCOUNTER — Encounter (HOSPITAL_COMMUNITY): Payer: Self-pay | Admitting: Cardiology

## 2018-01-27 ENCOUNTER — Ambulatory Visit: Payer: Medicaid Other | Admitting: Podiatry

## 2018-01-27 LAB — CBC
HCT: 30.5 % — ABNORMAL LOW (ref 39.0–52.0)
HEMOGLOBIN: 10.5 g/dL — AB (ref 13.0–17.0)
MCH: 30.3 pg (ref 26.0–34.0)
MCHC: 34.4 g/dL (ref 30.0–36.0)
MCV: 88.2 fL (ref 78.0–100.0)
Platelets: 325 10*3/uL (ref 150–400)
RBC: 3.46 MIL/uL — AB (ref 4.22–5.81)
RDW: 11.9 % (ref 11.5–15.5)
WBC: 10.5 10*3/uL (ref 4.0–10.5)

## 2018-01-27 LAB — TROPONIN I
TROPONIN I: 25.89 ng/mL — AB (ref <0.03)
Troponin I: 20.6 ng/mL (ref <0.03)

## 2018-01-27 LAB — BASIC METABOLIC PANEL
ANION GAP: 9 (ref 5–15)
BUN: 23 mg/dL — ABNORMAL HIGH (ref 6–20)
CHLORIDE: 100 mmol/L — AB (ref 101–111)
CO2: 21 mmol/L — ABNORMAL LOW (ref 22–32)
Calcium: 8.1 mg/dL — ABNORMAL LOW (ref 8.9–10.3)
Creatinine, Ser: 2.93 mg/dL — ABNORMAL HIGH (ref 0.61–1.24)
GFR calc non Af Amer: 25 mL/min — ABNORMAL LOW (ref >60)
GFR, EST AFRICAN AMERICAN: 29 mL/min — AB (ref >60)
Glucose, Bld: 315 mg/dL — ABNORMAL HIGH (ref 65–99)
Potassium: 4.4 mmol/L (ref 3.5–5.1)
Sodium: 130 mmol/L — ABNORMAL LOW (ref 135–145)

## 2018-01-27 LAB — POCT ACTIVATED CLOTTING TIME: ACTIVATED CLOTTING TIME: 340 s

## 2018-01-27 LAB — GLUCOSE, CAPILLARY
GLUCOSE-CAPILLARY: 239 mg/dL — AB (ref 65–99)
Glucose-Capillary: 274 mg/dL — ABNORMAL HIGH (ref 65–99)
Glucose-Capillary: 109 mg/dL — ABNORMAL HIGH (ref 65–99)
Glucose-Capillary: 170 mg/dL — ABNORMAL HIGH (ref 65–99)

## 2018-01-27 LAB — LIPID PANEL
CHOL/HDL RATIO: 7 ratio
CHOLESTEROL: 160 mg/dL (ref 0–200)
HDL: 23 mg/dL — ABNORMAL LOW (ref >40)
LDL Cholesterol: 62 mg/dL (ref 0–99)
Triglycerides: 376 mg/dL — ABNORMAL HIGH (ref <150)
VLDL: 75 mg/dL — ABNORMAL HIGH (ref 0–40)

## 2018-01-27 LAB — PLATELET COUNT: PLATELETS: 315 10*3/uL (ref 150–400)

## 2018-01-27 LAB — HIV ANTIBODY (ROUTINE TESTING W REFLEX): HIV Screen 4th Generation wRfx: NONREACTIVE

## 2018-01-27 MED ORDER — FAMOTIDINE 20 MG PO TABS
20.0000 mg | ORAL_TABLET | Freq: Every day | ORAL | Status: DC
  Administered 2018-01-27 – 2018-01-31 (×5): 20 mg via ORAL
  Filled 2018-01-27 (×5): qty 1

## 2018-01-27 MED ORDER — METOPROLOL TARTRATE 25 MG PO TABS
25.0000 mg | ORAL_TABLET | Freq: Two times a day (BID) | ORAL | Status: DC
  Administered 2018-01-27 – 2018-01-31 (×8): 25 mg via ORAL
  Filled 2018-01-27 (×8): qty 1

## 2018-01-27 MED ORDER — AMLODIPINE BESYLATE 2.5 MG PO TABS
2.5000 mg | ORAL_TABLET | Freq: Every day | ORAL | Status: DC
  Administered 2018-01-27 – 2018-01-31 (×5): 2.5 mg via ORAL
  Filled 2018-01-27 (×5): qty 1

## 2018-01-27 MED ORDER — INSULIN GLARGINE 100 UNIT/ML ~~LOC~~ SOLN
30.0000 [IU] | Freq: Every day | SUBCUTANEOUS | Status: DC
  Administered 2018-01-27 – 2018-01-30 (×4): 30 [IU] via SUBCUTANEOUS
  Filled 2018-01-27 (×5): qty 0.3

## 2018-01-27 MED FILL — Nitroglycerin IV Soln 100 MCG/ML in D5W: INTRA_ARTERIAL | Qty: 10 | Status: AC

## 2018-01-27 NOTE — Progress Notes (Signed)
Inpatient Diabetes Program Recommendations  AACE/ADA: New Consensus Statement on Inpatient Glycemic Control (2015)  Target Ranges:  Prepandial:   less than 140 mg/dL      Peak postprandial:   less than 180 mg/dL (1-2 hours)      Critically ill patients:  140 - 180 mg/dL   Lab Results  Component Value Date   GLUCAP 239 (H) 01/27/2018   HGBA1C 12.3 (H) 01/26/2018    Results for Derrick Mosley, Derrick Mosley (MRN [id number]) as of 01/27/2018 09:46  Ref. Range 01/13/2017 06:42 01/13/2017 11:33 01/26/2018 17:18 01/26/2018 22:01 01/27/2018 08:20  Glucose-Capillary Latest Ref Range: 65 - 99 mg/dL 159 (H) 172 (H) 380 (H) 334 (H) 239 (H)    Diabetes history: DM2 Outpatient Diabetes medications: Lantus 30 units qd Current orders for Inpatient glycemic control: Lantus 20 units + Novolog sensitive correction tid  Inpatient Diabetes Program Recommendations:   -Increase Lantus to 30 units -Add Novolog 4 units tid meal coverage if eats 50%  Thank you, Bethena Roys E. Sharry Beining, RN, MSN, CDE  Diabetes Coordinator Inpatient Glycemic Control Team Team Pager [phone] (8am-5pm) 01/27/2018 9:48 AM

## 2018-01-27 NOTE — Progress Notes (Signed)
CARDIAC REHAB PHASE I   PRE:  Rate/Rhythm: 86 SR  BP:  Supine: 129/95     SaO2: 100% RA  MODE:  Ambulation: 180 ft   POST:  Rate/Rhythm: 79 SR  BP:  Sitting: 134/9     SaO2: 100% RA  1110-1240 Pt ambulated 180 ft with standby assist.  Walks with limp d/t prior ailments. C/o Chronic back pain.  Education completed including stent card, medications, RF modifiation, and exercise. Verbalized understanding. Booklet given to patient. Smoking cessation information and fake cigarette given to patient. Pt expresses desire to quit smoking.   Noel Christmas, RN 01/27/2018 12:41 PM

## 2018-01-27 NOTE — Plan of Care (Signed)
Patient educated and verbalizes understanding of importance of changing diet and monitoring blood sugar to reduce complications related to diabetes. Patient understands need to gain better control over disease management and stated a goal of reducing HbA1c levels.

## 2018-01-27 NOTE — Progress Notes (Signed)
Contacted Dr. Terrence Dupont reference to pt. BP trending upwards. Trend appears x 2 days. Dr. Terrence Dupont provided verbal orders to increase metoprolol from 12.5mg  to 25 mg BID and added 2.5mg  amlodipine to manage pt. BP. Will continue to monitor patient trends.

## 2018-01-27 NOTE — Plan of Care (Signed)
Pt. Verbalizes knowledge of current condition and can state aspects that he can change to improve disease management.

## 2018-01-27 NOTE — Progress Notes (Signed)
EKG CRITICAL VALUE     12 lead EKG performed.  Critical value Edwena Blow, RN notified.   Brennan Karam L, CCT 01/27/2018 8:03 AM

## 2018-01-27 NOTE — Progress Notes (Signed)
Subjective:  Denies any chest pain or shortness of breath. States overall feels better.cardiac enzymes trending down creatinine trending up.  Objective:  Vital Signs in the last 24 hours: Temp:  [97.9 F (36.6 C)-100.7 F (38.2 C)] 98.5 F (36.9 C) (04/01 0817) Pulse Rate:  [83-103] 85 (04/01 1000) Resp:  [0-45] 25 (04/01 1000) BP: (105-161)/(72-112) 126/88 (04/01 1000) SpO2:  [94 %-100 %] 100 % (04/01 1000) Arterial Line BP: (112-192)/(75-116) 135/82 (03/31 2238)  Intake/Output from previous day: 03/31 0701 - 04/01 0700 In: 1615.5 [P.O.:480; I.V.:1135.5] Out: 750 [Urine:750] Intake/Output from this shift: Total I/O In: 240 [P.O.:240] Out: -   Physical Exam: Neck: no adenopathy, no carotid bruit, no JVD and supple, symmetrical, trachea midline Lungs: clear to auscultation bilaterally Heart: regular rate and rhythm, S1, S2 normal and Soft systolic murmur noted Abdomen: soft, non-tender; bowel sounds normal; no masses,  no organomegaly Extremities: extremities normal, atraumatic, no cyanosis or edema and Right groin stable no evidence of hematoma or bruit.  Lab Results: Recent Labs    01/26/18 1048 01/27/18 0127  WBC  --  10.5  HGB 12.9* 10.5*  PLT  --  315  325   Recent Labs    01/26/18 1048 01/27/18 0127  NA 131* 130*  K 4.2 4.4  CL 95* 100*  CO2  --  21*  GLUCOSE 363* 315*  BUN 19 23*  CREATININE 1.80* 2.93*   Recent Labs    01/27/18 0127 01/27/18 0811  TROPONINI 25.89* 20.60*   Hepatic Function Panel No results for input(s): PROT, ALBUMIN, AST, ALT, ALKPHOS, BILITOT, BILIDIR, IBILI in the last 72 hours. Recent Labs    01/27/18 0127  CHOL 160   No results for input(s): PROTIME in the last 72 hours.  Imaging: Imaging results have been reviewed and No results found.  Cardiac Studies:  Assessment/Plan:  Status post acute inferoposterolateral wall myocardial infarction status post PCI to 100% occluded left circumflex Hypertension Diabetes  mellitus Acute on chronic kidney disease secondary to contrast Diabetic neuropathy Peripheral vascular disease status post amputation of fifth toe right foot in the past Strong family history of coronary artery disease Tobacco abuse Plan Continue present management Check labs in a.m. Check 2-D echo Okay to transfer to stepdown  LOS: 1 day    Charolette Forward 01/27/2018, 11:51 AM

## 2018-01-27 NOTE — Progress Notes (Addendum)
Report called to Production assistant, radio. Patient transferring to [facility] room 29. All personal belongings accounted for. Family is aware of transfer.

## 2018-01-28 ENCOUNTER — Inpatient Hospital Stay (HOSPITAL_COMMUNITY): Payer: Medicaid Other

## 2018-01-28 LAB — BASIC METABOLIC PANEL
Anion gap: 13 (ref 5–15)
BUN: 29 mg/dL — AB (ref 6–20)
CHLORIDE: 99 mmol/L — AB (ref 101–111)
CO2: 22 mmol/L (ref 22–32)
Calcium: 8.3 mg/dL — ABNORMAL LOW (ref 8.9–10.3)
Creatinine, Ser: 5.81 mg/dL — ABNORMAL HIGH (ref 0.61–1.24)
GFR calc Af Amer: 13 mL/min — ABNORMAL LOW (ref >60)
GFR calc non Af Amer: 11 mL/min — ABNORMAL LOW (ref >60)
GLUCOSE: 111 mg/dL — AB (ref 65–99)
POTASSIUM: 3.9 mmol/L (ref 3.5–5.1)
Sodium: 134 mmol/L — ABNORMAL LOW (ref 135–145)

## 2018-01-28 LAB — CBC
HEMATOCRIT: 29.7 % — AB (ref 39.0–52.0)
Hemoglobin: 9.9 g/dL — ABNORMAL LOW (ref 13.0–17.0)
MCH: 29.6 pg (ref 26.0–34.0)
MCHC: 33.3 g/dL (ref 30.0–36.0)
MCV: 88.7 fL (ref 78.0–100.0)
Platelets: 314 10*3/uL (ref 150–400)
RBC: 3.35 MIL/uL — ABNORMAL LOW (ref 4.22–5.81)
RDW: 11.9 % (ref 11.5–15.5)
WBC: 10.5 10*3/uL (ref 4.0–10.5)

## 2018-01-28 LAB — GLUCOSE, CAPILLARY
GLUCOSE-CAPILLARY: 178 mg/dL — AB (ref 65–99)
GLUCOSE-CAPILLARY: 198 mg/dL — AB (ref 65–99)
GLUCOSE-CAPILLARY: 214 mg/dL — AB (ref 65–99)
Glucose-Capillary: 112 mg/dL — ABNORMAL HIGH (ref 65–99)

## 2018-01-28 LAB — TROPONIN I: Troponin I: 16.75 ng/mL (ref <0.03)

## 2018-01-28 MED ORDER — LIVING WELL WITH DIABETES BOOK
Freq: Once | Status: AC
  Administered 2018-01-28: 19:00:00
  Filled 2018-01-28 (×2): qty 1

## 2018-01-28 NOTE — Progress Notes (Addendum)
Pt states he is feeling anxious. Pt states he has no chest pain. Pt is upset because he is unable to work at this time and feels like he lost a good job due to his numerous health issues. RN sat and spoke with pt. Pt states he feels better now that he has talked to someone.

## 2018-01-28 NOTE — Progress Notes (Signed)
RN informed pt of channels for diabetes education. RN changed channel to 501. Pt states he will watch videos. RN wrote channel numbers on pt's whiteboard. RN will give and educate pt about living with diabetes book once it arrives on unit.

## 2018-01-28 NOTE — Progress Notes (Signed)
Subjective:  Patient denies any chest pain or shortness of breath.  By mouth fluid intake is poor.  States voiding okay.  Renal function markedly deteriorated.  Objective:  Vital Signs in the last 24 hours: Temp:  [97.6 F (36.4 C)-98.8 F (37.1 C)] 98.3 F (36.8 C) (04/02 1104) Pulse Rate:  [72-92] 80 (04/02 1104) Resp:  [12-26] 18 (04/02 1104) BP: (109-148)/(67-100) 148/95 (04/02 1104) SpO2:  [98 %-100 %] 100 % (04/02 1104) Weight:  [95 kg (209 lb 7 oz)-95 kg (209 lb 8 oz)] 95 kg (209 lb 7 oz) (04/02 0800)  Intake/Output from previous day: 04/01 0701 - 04/02 0700 In: 1080 [P.O.:1080] Out: -  Intake/Output from this shift: Total I/O In: 240 [P.O.:240] Out: -   Physical Exam: Neck: no adenopathy, no carotid bruit, no JVD and supple, symmetrical, trachea midline Lungs: clear to auscultation bilaterally Heart: regular rate and rhythm, S1, S2 normal and soft systolic murmur noted Abdomen: soft, non-tender; bowel sounds normal; no masses,  no organomegaly Extremities: extremities normal, atraumatic, no cyanosis or edema  Lab Results: Recent Labs    01/27/18 0127 01/28/18 0454  WBC 10.5 10.5  HGB 10.5* 9.9*  PLT 315  325 314   Recent Labs    01/27/18 0127 01/28/18 0454  NA 130* 134*  K 4.4 3.9  CL 100* 99*  CO2 21* 22  GLUCOSE 315* 111*  BUN 23* 29*  CREATININE 2.93* 5.81*   Recent Labs    01/27/18 0811 01/28/18 0454  TROPONINI 20.60* 16.75*   Hepatic Function Panel No results for input(s): PROT, ALBUMIN, AST, ALT, ALKPHOS, BILITOT, BILIDIR, IBILI in the last 72 hours. Recent Labs    01/27/18 0127  CHOL 160   No results for input(s): PROTIME in the last 72 hours.  Imaging: Imaging results have been reviewed and No results found.  Cardiac Studies:  Assessment/Plan:  Status post acute inferoposterolateral wall myocardial infarction status post PCI to 100% occluded left circumflex Hypertension Diabetes mellitus Acute on chronic kidney disease  secondary to contrast Diabetic neuropathy Peripheral vascular disease status post amputation of fifth toe right foot in the past Strong family history of coronary artery disease Tobacco abuse Plan Patient increase by mouth fluid intake.  We'll start him on IV fluids for the next 24 hours and hopefully his renal function will trend towards baseline Labs in a.m.  LOS: 2 days    Charolette Forward 01/28/2018, 1:01 PM

## 2018-01-28 NOTE — Progress Notes (Signed)
Inpatient Diabetes Program Recommendations  AACE/ADA: New Consensus Statement on Inpatient Glycemic Control (2015)  Target Ranges:  Prepandial:   less than 140 mg/dL      Peak postprandial:   less than 180 mg/dL (1-2 hours)      Critically ill patients:  140 - 180 mg/dL   Lab Results  Component Value Date   GLUCAP 178 (H) 01/28/2018   HGBA1C 12.3 (H) 01/26/2018    Review of Glycemic Control Results for BASTION, BOLGER (MRN [id number]) as of 01/28/2018 14:38  Ref. Range 01/27/2018 12:04 01/27/2018 16:23 01/27/2018 21:25 01/28/2018 07:25 01/28/2018 11:36  Glucose-Capillary Latest Ref Range: 65 - 99 mg/dL 274 (H) 109 (H) 170 (H) 112 (H) 178 (H)   Diabetes history: DM2 Outpatient Diabetes medications: Lantus 30 units Current orders for Inpatient glycemic control: Lantus 30 units + Novolog sensitive correction tid  Inpatient Diabetes Program Recommendations:   Spoke with pt about A1C results with them 12.3 (average blood glucose approx. 300 over the past 2-3 months time) and explained what an A1C is, basic pathophysiology of DM Type 2, basic home care, basic diabetes diet nutrition principles, importance of checking CBGs and maintaining good CBG control to prevent long-term and short-term complications. Reviewed signs and symptoms of hyperglycemia and hypoglycemia and how to treat hypoglycemia at home. Also reviewed blood sugar goals at home.  RNs to provide ongoing basic DM education at bedside with this patient. Have ordered educational booklet and DM videos. Patient states he has seen endocrinologist Dr. Dorris Fetch in the past but has an appt. With another endocrinologist on May 2nd. Requested patient to check CBGs 3-4 times daily and take meter or list of CBGs to next appt. For review and medication management changes. Patient states appreciation for basic review of plate method and discussion of insulin management.  Thank you, Nani Gasser. Kaisa Wofford, RN, MSN, CDE  Diabetes Coordinator Inpatient Glycemic  Control Team Team Pager [phone] (8am-5pm) 01/28/2018 2:43 PM

## 2018-01-28 NOTE — Progress Notes (Signed)
CARDIAC REHAB PHASE I   PRE:  Rate/Rhythm: 85 SR  BP:  Supine:   Sitting: 140/97  Standing:    SaO2: 97%RA  MODE:  Ambulation: 300 ft   POST:  Rate/Rhythm: 82  BP:  Supine:    Sitting: 135/91  Standing:    SaO2: 100%RA 1032-1055 Pt walked 300 ft with steady gait. No CP but did have back pain. Pt cannot walk far distances so did not give ex ed. Reinforced importance of brilinta with stent. Encouraged better control of diabetes and smoking cessation. Pt does not think it will be hard to quit as he has In the past. Has been feeling anxious today. Emotional support given.   Graylon Good, RN BSN  01/28/2018 11:24 AM

## 2018-01-28 NOTE — Progress Notes (Signed)
Patient refused Am weight. States he wants to wait till later to get weighed. Will continue to monitor patient.

## 2018-01-29 ENCOUNTER — Inpatient Hospital Stay (HOSPITAL_COMMUNITY): Payer: Medicaid Other

## 2018-01-29 LAB — GLUCOSE, CAPILLARY
GLUCOSE-CAPILLARY: 98 mg/dL (ref 65–99)
GLUCOSE-CAPILLARY: 256 mg/dL — AB (ref 65–99)
GLUCOSE-CAPILLARY: 206 mg/dL — AB (ref 65–99)
GLUCOSE-CAPILLARY: 210 mg/dL — AB (ref 65–99)

## 2018-01-29 LAB — BASIC METABOLIC PANEL
ANION GAP: 12 (ref 5–15)
BUN: 33 mg/dL — ABNORMAL HIGH (ref 6–20)
CO2: 22 mmol/L (ref 22–32)
Calcium: 7.8 mg/dL — ABNORMAL LOW (ref 8.9–10.3)
Chloride: 98 mmol/L — ABNORMAL LOW (ref 101–111)
Creatinine, Ser: 6.18 mg/dL — ABNORMAL HIGH (ref 0.61–1.24)
GFR calc Af Amer: 12 mL/min — ABNORMAL LOW (ref >60)
GFR calc non Af Amer: 10 mL/min — ABNORMAL LOW (ref >60)
GLUCOSE: 97 mg/dL (ref 65–99)
POTASSIUM: 4.5 mmol/L (ref 3.5–5.1)
Sodium: 132 mmol/L — ABNORMAL LOW (ref 135–145)

## 2018-01-29 LAB — CREATININE, URINE, RANDOM: Creatinine, Urine: 43.99 mg/dL

## 2018-01-29 LAB — URINALYSIS, COMPLETE (UACMP) WITH MICROSCOPIC
BILIRUBIN URINE: NEGATIVE
Bacteria, UA: NONE SEEN
Glucose, UA: 50 mg/dL — AB
Ketones, ur: NEGATIVE mg/dL
Leukocytes, UA: NEGATIVE
NITRITE: NEGATIVE
PH: 7 (ref 5.0–8.0)
Protein, ur: 100 mg/dL — AB
SPECIFIC GRAVITY, URINE: 1.006 (ref 1.005–1.030)

## 2018-01-29 LAB — CBC
HEMATOCRIT: 26.8 % — AB (ref 39.0–52.0)
HEMOGLOBIN: 9.1 g/dL — AB (ref 13.0–17.0)
MCH: 29.6 pg (ref 26.0–34.0)
MCHC: 34 g/dL (ref 30.0–36.0)
MCV: 87.3 fL (ref 78.0–100.0)
Platelets: 353 10*3/uL (ref 150–400)
RBC: 3.07 MIL/uL — ABNORMAL LOW (ref 4.22–5.81)
RDW: 11.8 % (ref 11.5–15.5)
WBC: 9.5 10*3/uL (ref 4.0–10.5)

## 2018-01-29 LAB — PROTEIN / CREATININE RATIO, URINE
CREATININE, URINE: 43.73 mg/dL
PROTEIN CREATININE RATIO: 1.62 mg/mg{creat} — AB (ref 0.00–0.15)
Total Protein, Urine: 71 mg/dL

## 2018-01-29 LAB — TROPONIN I: Troponin I: 15.06 ng/mL (ref <0.03)

## 2018-01-29 LAB — ECHOCARDIOGRAM COMPLETE
Height: 71 in
Weight: 3444.8 oz

## 2018-01-29 LAB — SODIUM, URINE, RANDOM: Sodium, Ur: 62 mmol/L

## 2018-01-29 MED ORDER — POLYETHYLENE GLYCOL 3350 17 G PO PACK
17.0000 g | PACK | Freq: Every day | ORAL | Status: DC | PRN
  Administered 2018-01-29: 17 g via ORAL
  Filled 2018-01-29: qty 1

## 2018-01-29 MED ORDER — ALPRAZOLAM 0.25 MG PO TABS
0.2500 mg | ORAL_TABLET | Freq: Two times a day (BID) | ORAL | Status: DC | PRN
  Administered 2018-01-29 – 2018-01-30 (×3): 0.25 mg via ORAL
  Filled 2018-01-29 (×3): qty 1

## 2018-01-29 NOTE — Progress Notes (Signed)
CARDIAC REHAB PHASE I   PRE:  Rate/Rhythm: 82 SR  BP:  Supine:   Sitting: 126/87  Standing:    SaO2: 100%RA  MODE:  Ambulation: 470 ft   POST:  Rate/Rhythm: 85  BP:  Supine:   Sitting: 126/87  Standing:    SaO2: 100%RA 1330-1359 Pt walked 470 ft with steady gait. No CP but back hurting. Tolerated well.   Graylon Good, RN BSN  01/29/2018 1:52 PM

## 2018-01-29 NOTE — Progress Notes (Signed)
Subjective:  Patient denies any chest pain or shortness of breath.  Urine output has improved since yesterday.  Creatinine trending up, hopefully has platued  and will trend down Objective:  Vital Signs in the last 24 hours: Temp:  [98 F (36.7 C)-98.6 F (37 C)] 98.6 F (37 C) (04/03 0736) Pulse Rate:  [78-88] 78 (04/03 0736) Resp:  [18] 18 (04/03 0736) BP: (120-148)/(80-95) 121/80 (04/03 0736) SpO2:  [100 %] 100 % (04/03 0113) Weight:  [97.7 kg (215 lb 4.8 oz)] 97.7 kg (215 lb 4.8 oz) (04/03 0736)  Intake/Output from previous day: 04/02 0701 - 04/03 0700 In: 720 [P.O.:720] Out: 150 [Urine:150] Intake/Output from this shift: Total I/O In: 483 [P.O.:480; I.V.:3] Out: 1450 [Urine:1450]  Physical Exam: Neck: no adenopathy, no carotid bruit, no JVD and supple, symmetrical, trachea midline Lungs: clear to auscultation bilaterally Heart: regular rate and rhythm, S1, S2 normal and soft systolic murmur noted Abdomen: soft, non-tender; bowel sounds normal; no masses,  no organomegaly Extremities: extremities normal, atraumatic, no cyanosis or edema  Lab Results: Recent Labs    01/28/18 0454 01/29/18 0431  WBC 10.5 9.5  HGB 9.9* 9.1*  PLT 314 353   Recent Labs    01/28/18 0454 01/29/18 0431  NA 134* 132*  K 3.9 4.5  CL 99* 98*  CO2 22 22  GLUCOSE 111* 97  BUN 29* 33*  CREATININE 5.81* 6.18*   Recent Labs    01/28/18 0454 01/29/18 0431  TROPONINI 16.75* 15.06*   Hepatic Function Panel No results for input(s): PROT, ALBUMIN, AST, ALT, ALKPHOS, BILITOT, BILIDIR, IBILI in the last 72 hours. Recent Labs    01/27/18 0127  CHOL 160   No results for input(s): PROTIME in the last 72 hours.  Imaging: Imaging results have been reviewed and No results found.  Cardiac Studies:  Assessment/Plan:  Status post acute inferoposterolateral wall myocardial infarction status post PCI to 100% occluded left circumflex Hypertension Diabetes mellitus Acute on chronic kidney  disease secondary to contrast Diabetic neuropathy Peripheral vascular disease status post amputation of fifth toe right foot in the past Strong family history of coronary artery disease Tobacco abuse Plan Continue IV fluid.  Patient encouraged by mouth intake. Check renal ultrasound.  Will get renal consult. Check labs in a.m.   LOS: 3 days    Charolette Forward 01/29/2018, 10:00 AM

## 2018-01-29 NOTE — Plan of Care (Signed)
  Problem: Clinical Measurements: Goal: Ability to maintain clinical measurements within normal limits will improve Outcome: Progressing   Problem: Clinical Measurements: Goal: Cardiovascular complication will be avoided Outcome: Progressing   Problem: Safety: Goal: Ability to remain free from injury will improve Outcome: Progressing

## 2018-01-29 NOTE — Progress Notes (Signed)
Patient's anxiety seems to be better. He is resting in room and comfortable. Patient has been creating urine today. Will continue to monitor.

## 2018-01-29 NOTE — Progress Notes (Signed)
Patient complaining of Chest pain 6/10. EKG performed. Patient refuses nitro. States pain is only bad when he takes deep breaths and he does not want to have a headache from nitro. Dr Terrence Dupont paged and notified.  Update: Dr Terrence Dupont returned page. Order to continue to monitor patient and report back any further changes. No new orders at this time. Will continue to monitor patient.

## 2018-01-29 NOTE — Progress Notes (Signed)
  Echocardiogram 2D Echocardiogram has been performed.  Derrick Mosley 01/29/2018, 12:07 PM

## 2018-01-29 NOTE — Progress Notes (Signed)
Patient refused Am vitals and weight. Prefers to be weighed at breakfast. Will pass on in report to day shift RN
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
## 2018-01-29 NOTE — Progress Notes (Addendum)
Paged Dr Terrence Dupont to make aware of patient's labs. Awaiting call back from MD. Will continue to monitor patient.   Update. Dr Terrence Dupont returned call. Informed of patient's lab. Verbal order for Renal Ultrasound. Will place order now.

## 2018-01-30 LAB — RENAL FUNCTION PANEL
ANION GAP: 7 (ref 5–15)
Albumin: 2.1 g/dL — ABNORMAL LOW (ref 3.5–5.0)
BUN: 21 mg/dL — ABNORMAL HIGH (ref 6–20)
CALCIUM: 8.1 mg/dL — AB (ref 8.9–10.3)
CO2: 22 mmol/L (ref 22–32)
Chloride: 108 mmol/L (ref 101–111)
Creatinine, Ser: 3.63 mg/dL — ABNORMAL HIGH (ref 0.61–1.24)
GFR, EST AFRICAN AMERICAN: 23 mL/min — AB (ref >60)
GFR, EST NON AFRICAN AMERICAN: 19 mL/min — AB (ref >60)
Glucose, Bld: 117 mg/dL — ABNORMAL HIGH (ref 65–99)
PHOSPHORUS: 3.7 mg/dL (ref 2.5–4.6)
Potassium: 4.9 mmol/L (ref 3.5–5.1)
SODIUM: 137 mmol/L (ref 135–145)

## 2018-01-30 LAB — FERRITIN: Ferritin: 312 ng/mL (ref 24–336)

## 2018-01-30 LAB — TROPONIN I: Troponin I: 10.45 ng/mL (ref <0.03)

## 2018-01-30 LAB — CBC
HCT: 25.7 % — ABNORMAL LOW (ref 39.0–52.0)
Hemoglobin: 8.8 g/dL — ABNORMAL LOW (ref 13.0–17.0)
MCH: 30.6 pg (ref 26.0–34.0)
MCHC: 34.2 g/dL (ref 30.0–36.0)
MCV: 89.2 fL (ref 78.0–100.0)
PLATELETS: 374 10*3/uL (ref 150–400)
RBC: 2.88 MIL/uL — ABNORMAL LOW (ref 4.22–5.81)
RDW: 12 % (ref 11.5–15.5)
WBC: 8 10*3/uL (ref 4.0–10.5)

## 2018-01-30 LAB — IMMUNOFIXATION, URINE

## 2018-01-30 LAB — IRON AND TIBC
IRON: 16 ug/dL — AB (ref 45–182)
SATURATION RATIOS: 9 % — AB (ref 17.9–39.5)
TIBC: 174 ug/dL — AB (ref 250–450)
UIBC: 158 ug/dL

## 2018-01-30 LAB — GLUCOSE, CAPILLARY
GLUCOSE-CAPILLARY: 95 mg/dL (ref 65–99)
GLUCOSE-CAPILLARY: 126 mg/dL — AB (ref 65–99)
Glucose-Capillary: 197 mg/dL — ABNORMAL HIGH (ref 65–99)
Glucose-Capillary: 149 mg/dL — ABNORMAL HIGH (ref 65–99)

## 2018-01-30 LAB — VITAMIN B12: VITAMIN B 12: 332 pg/mL (ref 180–914)

## 2018-01-30 MED ORDER — FERUMOXYTOL INJECTION 510 MG/17 ML
510.0000 mg | INTRAVENOUS | Status: DC
  Administered 2018-01-30: 510 mg via INTRAVENOUS
  Filled 2018-01-30: qty 17

## 2018-01-30 NOTE — Progress Notes (Signed)
S: Feels well O:BP 129/88 (BP Location: Right Arm)   Pulse 74   Temp 98.1 F (36.7 C) (Oral)   Resp 16   Ht 5\' 11"  (1.803 m)   Wt 98.1 kg (216 lb 4.3 oz)   SpO2 97%   BMI 30.16 kg/m   Intake/Output Summary (Last 24 hours) at 01/30/2018 1113 Last data filed at 01/30/2018 0911 Gross per 24 hour  Intake 1340 ml  Output 5000 ml  Net -3660 ml   Intake/Output: I/O last 3 completed shifts: In: 1823 [P.O.:720; I.V.:3; Other:1100] Out: 5300 [Urine:5300]  Intake/Output this shift:  Total I/O In: -  Out: 1300 [Urine:1300] Weight change: 2.659 kg (5 lb 13.8 oz) Gen: NAD CVS: no rub Resp: cta Abd: benign Ext: no edema  Recent Labs  Lab 01/26/18 1048 01/27/18 0127 01/28/18 0454 01/29/18 0431 01/30/18 0504  NA 131* 130* 134* 132* 137  K 4.2 4.4 3.9 4.5 4.9  CL 95* 100* 99* 98* 108  CO2  --  21* 22 22 22   GLUCOSE 363* 315* 111* 97 117*  BUN 19 23* 29* 33* 21*  CREATININE 1.80* 2.93* 5.81* 6.18* 3.63*  ALBUMIN  --   --   --   --  2.1*  CALCIUM  --  8.1* 8.3* 7.8* 8.1*  PHOS  --   --   --   --  3.7   Liver Function Tests: Recent Labs  Lab 01/30/18 0504  ALBUMIN 2.1*   No results for input(s): LIPASE, AMYLASE in the last 168 hours. No results for input(s): AMMONIA in the last 168 hours. CBC: Recent Labs  Lab 01/27/18 0127 01/28/18 0454 01/29/18 0431 01/30/18 0504  WBC 10.5 10.5 9.5 8.0  HGB 10.5* 9.9* 9.1* 8.8*  HCT 30.5* 29.7* 26.8* 25.7*  MCV 88.2 88.7 87.3 89.2  PLT 315  325 314 353 374   Cardiac Enzymes: Recent Labs  Lab 01/27/18 0127 01/27/18 0811 01/28/18 0454 01/29/18 0431 01/30/18 0504  TROPONINI 25.89* 20.60* 16.75* 15.06* 10.45*   CBG: Recent Labs  Lab 01/29/18 0740 01/29/18 1118 01/29/18 1707 01/29/18 2136 01/30/18 0829  GLUCAP 98 256* 206* 210* 95    Iron Studies:  Recent Labs    01/30/18 0504  IRON 16*  TIBC 174*  FERRITIN 312   Studies/Results: US Renal  Result Date: 01/29/2018 CLINICAL DATA:  Elevated creatinine EXAM:  RENAL / URINARY TRACT ULTRASOUND COMPLETE COMPARISON:  None. FINDINGS: Right Kidney: Length: 13.0 cm. Echogenicity within normal limits. No mass or hydronephrosis visualized. Left Kidney: Length: 13.0 cm. Echogenicity within normal limits. No mass or hydronephrosis visualized. Bladder: Appears normal for degree of bladder distention. IMPRESSION: No acute abnormality noted. Electronically Signed   By: Inez Catalina M.D.   On: 01/29/2018 11:37   . amLODipine  2.5 mg Oral Daily  . aspirin EC  81 mg Oral Daily  . atorvastatin  80 mg Oral q1800  . famotidine  20 mg Oral Daily  . gabapentin  100 mg Oral QHS  . insulin aspart  0-9 Units Subcutaneous TID WC  . insulin glargine  30 Units Subcutaneous QHS  . metoprolol tartrate  25 mg Oral BID  . sodium chloride flush  3 mL Intravenous Q12H  . ticagrelor  90 mg Oral BID    BMET    Component Value Date/Time   NA 137 01/30/2018 0504   K 4.9 01/30/2018 0504   CL 108 01/30/2018 0504   CO2 22 01/30/2018 0504   GLUCOSE 117 (H) 01/30/2018 1610  BUN 21 (H) 01/30/2018 0504   BUN 17 07/05/2017   CREATININE 3.63 (H) 01/30/2018 0504   CREATININE 1.64 (H) 07/05/2017 1157   CALCIUM 8.1 (L) 01/30/2018 0504   GFRNONAA 19 (L) 01/30/2018 0504   GFRNONAA 52 (L) 07/05/2017 1157   GFRAA 23 (L) 01/30/2018 0504   GFRAA 60 07/05/2017 1157   CBC    Component Value Date/Time   WBC 8.0 01/30/2018 0504   RBC 2.88 (L) 01/30/2018 0504   HGB 8.8 (L) 01/30/2018 0504   HCT 25.7 (L) 01/30/2018 0504   HCT 30.7 (L) 03/30/2015 0616   PLT 374 01/30/2018 0504   MCV 89.2 01/30/2018 0504   MCH 30.6 01/30/2018 0504   MCHC 34.2 01/30/2018 0504   RDW 12.0 01/30/2018 0504   LYMPHSABS 1.7 01/11/2017 0121   MONOABS 1.2 (H) 01/11/2017 0121   EOSABS 0.0 01/11/2017 0121   BASOSABS 0.0 01/11/2017 0121    Assessment/Plan: 1.  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  Pt produced over 2 liters of urine and renal US was  without obstruction.  Nothing more to do but to monitor his renal function and avoid any further nephrotoxic exposure.   1. Cr improving 2. Would recommend stopping IVF's later today and see if renal function cont to improve without IVF's prior to discharge 3. Will continue to follow. 2. STEMI- s/p PTCA and DES placement of 100% occluded LCx.  Improved symptoms 3. DM with renal complications- stressed importance of diabetic control 4. Tobacco abuse- stressed smoking cessation 5. Anemia- presumably due to anemia of CKD stage 3 with diabetes.  iron stores are low and would recommend IV feraheme if ok with primary svc and follow.  6. PVD s/p amputation of right 5th toe due to diabetic foot ulcer 7. Neuropathy- on gabapentin   Donetta Potts, MD [facility] [phone]

## 2018-01-30 NOTE — Progress Notes (Signed)
Subjective:  Patient denies any chest pain or shortness of breath.  Urine output markedly improved.  Renal function also improving.  Objective:  Vital Signs in the last 24 hours: Temp:  [98.1 F (36.7 C)-99.4 F (37.4 C)] 98.1 F (36.7 C) (04/04 1126) Pulse Rate:  [74-85] 81 (04/04 1126) Resp:  [16-20] 16 (04/04 1126) BP: (107-137)/(67-88) 137/85 (04/04 1126) SpO2:  [96 %-99 %] 99 % (04/04 1126) Weight:  [98.1 kg (216 lb 4.3 oz)] 98.1 kg (216 lb 4.3 oz) (04/04 0827)  Intake/Output from previous day: 04/03 0701 - 04/04 0700 In: 1823 [P.O.:720; I.V.:3] Out: 5150 [Urine:5150] Intake/Output from this shift: Total I/O In: -  Out: 1300 [Urine:1300]  Physical Exam: Neck: no adenopathy, no carotid bruit, no JVD and supple, symmetrical, trachea midline Lungs: clear to auscultation bilaterally Heart: regular rate and rhythm, S1, S2 normal and soft systolic murmur noted Abdomen: soft, non-tender; bowel sounds normal; no masses,  no organomegaly Extremities: extremities normal, atraumatic, no cyanosis or edema  Lab Results: Recent Labs    01/29/18 0431 01/30/18 0504  WBC 9.5 8.0  HGB 9.1* 8.8*  PLT 353 374   Recent Labs    01/29/18 0431 01/30/18 0504  NA 132* 137  K 4.5 4.9  CL 98* 108  CO2 22 22  GLUCOSE 97 117*  BUN 33* 21*  CREATININE 6.18* 3.63*   Recent Labs    01/29/18 0431 01/30/18 0504  TROPONINI 15.06* 10.45*   Hepatic Function Panel Recent Labs    01/30/18 0504  ALBUMIN 2.1*   No results for input(s): CHOL in the last 72 hours. No results for input(s): PROTIME in the last 72 hours.  Imaging: Imaging results have been reviewed and US Renal  Result Date: 01/29/2018 CLINICAL DATA:  Elevated creatinine EXAM: RENAL / URINARY TRACT ULTRASOUND COMPLETE COMPARISON:  None. FINDINGS: Right Kidney: Length: 13.0 cm. Echogenicity within normal limits. No mass or hydronephrosis visualized. Left Kidney: Length: 13.0 cm. Echogenicity within normal limits. No mass or  hydronephrosis visualized. Bladder: Appears normal for degree of bladder distention. IMPRESSION: No acute abnormality noted. Electronically Signed   By: Inez Catalina M.D.   On: 01/29/2018 11:37    Cardiac Studies:  Assessment/Plan:  Status post acute inferoposterolateral wall myocardial infarction status post PCI to 100% occluded left circumflex Hypertension Diabetes mellitus Resolving Acute on chronic kidney disease secondary to contrast Diabetic neuropathy Peripheral vascular disease status post amputation of fifth toe right foot in the past Strong family history of coronary artery disease Tobacco abuse Anemia of chronic disease. Plan Agree with feraheme  transfusion. Will DC IV fluids. Check labs in a.m. If stable.  We will DC home if okay with renal service   LOS: 4 days    Charolette Forward 01/30/2018, 12:36 PM

## 2018-01-31 LAB — RENAL FUNCTION PANEL
ALBUMIN: 2.4 g/dL — AB (ref 3.5–5.0)
Anion gap: 9 (ref 5–15)
BUN: 17 mg/dL (ref 6–20)
CO2: 22 mmol/L (ref 22–32)
CREATININE: 2.7 mg/dL — AB (ref 0.61–1.24)
Calcium: 8.6 mg/dL — ABNORMAL LOW (ref 8.9–10.3)
Chloride: 103 mmol/L (ref 101–111)
GFR, EST AFRICAN AMERICAN: 32 mL/min — AB (ref >60)
GFR, EST NON AFRICAN AMERICAN: 28 mL/min — AB (ref >60)
Glucose, Bld: 118 mg/dL — ABNORMAL HIGH (ref 65–99)
PHOSPHORUS: 4.5 mg/dL (ref 2.5–4.6)
POTASSIUM: 4.8 mmol/L (ref 3.5–5.1)
Sodium: 134 mmol/L — ABNORMAL LOW (ref 135–145)

## 2018-01-31 LAB — CBC
HEMATOCRIT: 31.3 % — AB (ref 39.0–52.0)
Hemoglobin: 10.4 g/dL — ABNORMAL LOW (ref 13.0–17.0)
MCH: 29.6 pg (ref 26.0–34.0)
MCHC: 33.2 g/dL (ref 30.0–36.0)
MCV: 89.2 fL (ref 78.0–100.0)
PLATELETS: 472 10*3/uL — AB (ref 150–400)
RBC: 3.51 MIL/uL — AB (ref 4.22–5.81)
RDW: 11.9 % (ref 11.5–15.5)
WBC: 8.9 10*3/uL (ref 4.0–10.5)

## 2018-01-31 LAB — FOLATE RBC
Folate, Hemolysate: 299.5 ng/mL
Folate, RBC: 1097 ng/mL (ref >498)
HEMATOCRIT: 27.3 % — AB (ref 37.5–51.0)

## 2018-01-31 LAB — GLUCOSE, CAPILLARY
GLUCOSE-CAPILLARY: 111 mg/dL — AB (ref 65–99)
GLUCOSE-CAPILLARY: 184 mg/dL — AB (ref 65–99)

## 2018-01-31 MED ORDER — AMLODIPINE BESYLATE 2.5 MG PO TABS: 2.5000 mg | ORAL_TABLET | Freq: Every day | ORAL | 3 refills | Status: DC

## 2018-01-31 MED ORDER — ASPIRIN 81 MG PO TBEC: 81.0000 mg | DELAYED_RELEASE_TABLET | Freq: Every day | ORAL | 3 refills | Status: AC

## 2018-01-31 MED ORDER — METOPROLOL TARTRATE 25 MG PO TABS: 25.0000 mg | ORAL_TABLET | Freq: Two times a day (BID) | ORAL | 3 refills | Status: DC

## 2018-01-31 MED ORDER — ATORVASTATIN CALCIUM 80 MG PO TABS: 80.0000 mg | ORAL_TABLET | Freq: Every day | ORAL | 3 refills | Status: AC

## 2018-01-31 MED ORDER — NITROGLYCERIN 0.4 MG SL SUBL: 0.4000 mg | SUBLINGUAL_TABLET | SUBLINGUAL | 12 refills | Status: DC | PRN

## 2018-01-31 MED ORDER — TICAGRELOR 90 MG PO TABS: 90.0000 mg | ORAL_TABLET | Freq: Two times a day (BID) | ORAL | 11 refills | Status: AC

## 2018-01-31 NOTE — Progress Notes (Signed)
Brilinta coupon card given to patient with explanation of usage; Aneta Mins [phone]

## 2018-01-31 NOTE — Progress Notes (Signed)
S: Feels well O:BP 104/67   Pulse 78   Temp 98.2 F (36.8 C) (Oral)   Resp 18   Ht 5\' 11"  (1.803 m)   Wt 94.3 kg (208 lb)   SpO2 99%   BMI 29.01 kg/m   Intake/Output Summary (Last 24 hours) at 01/31/2018 1210 Last data filed at 01/31/2018 0900 Gross per 24 hour  Intake 597 ml  Output 4500 ml  Net -3903 ml   Intake/Output: I/O last 3 completed shifts: In: 6073 [P.O.:240; Other:1100; IV Piggyback:117] Out: 6900 [Urine:6900]  Intake/Output this shift:  Total I/O In: 480 [P.O.:480] Out: 600 [Urine:600] Weight change: 0.441 kg (15.5 oz) Gen: NAD CVS: no rub Resp: cta Abd: benign Ext: no edema  Recent Labs  Lab 01/26/18 1048 01/27/18 0127 01/28/18 0454 01/29/18 0431 01/30/18 0504 01/31/18 0531  NA 131* 130* 134* 132* 137 134*  K 4.2 4.4 3.9 4.5 4.9 4.8  CL 95* 100* 99* 98* 108 103  CO2  --  21* 22 22 22 22   GLUCOSE 363* 315* 111* 97 117* 118*  BUN 19 23* 29* 33* 21* 17  CREATININE 1.80* 2.93* 5.81* 6.18* 3.63* 2.70*  ALBUMIN  --   --   --   --  2.1* 2.4*  CALCIUM  --  8.1* 8.3* 7.8* 8.1* 8.6*  PHOS  --   --   --   --  3.7 4.5   Liver Function Tests: Recent Labs  Lab 01/30/18 0504 01/31/18 0531  ALBUMIN 2.1* 2.4*   No results for input(s): LIPASE, AMYLASE in the last 168 hours. No results for input(s): AMMONIA in the last 168 hours. CBC: Recent Labs  Lab 01/27/18 0127 01/28/18 0454 01/29/18 0431 01/30/18 0504 01/31/18 0531  WBC 10.5 10.5 9.5 8.0 8.9  HGB 10.5* 9.9* 9.1* 8.8* 10.4*  HCT 30.5* 29.7* 26.8* 25.7* 31.3*  MCV 88.2 88.7 87.3 89.2 89.2  PLT 315  325 314 353 374 472*   Cardiac Enzymes: Recent Labs  Lab 01/27/18 0127 01/27/18 0811 01/28/18 0454 01/29/18 0431 01/30/18 0504  TROPONINI 25.89* 20.60* 16.75* 15.06* 10.45*   CBG: Recent Labs  Lab 01/30/18 0829 01/30/18 1125 01/30/18 1621 01/30/18 2155 01/31/18 0757  GLUCAP 95 197* 149* 126* 111*    Iron Studies:  Recent Labs    01/30/18 0504  IRON 16*  TIBC 174*  FERRITIN  312   Studies/Results: No results found. Marland Kitchen amLODipine  2.5 mg Oral Daily  . aspirin EC  81 mg Oral Daily  . atorvastatin  80 mg Oral q1800  . famotidine  20 mg Oral Daily  . gabapentin  100 mg Oral QHS  . insulin aspart  0-9 Units Subcutaneous TID WC  . insulin glargine  30 Units Subcutaneous QHS  . metoprolol tartrate  25 mg Oral BID  . sodium chloride flush  3 mL Intravenous Q12H  . ticagrelor  90 mg Oral BID    BMET    Component Value Date/Time   NA 134 (L) 01/31/2018 0531   K 4.8 01/31/2018 0531   CL 103 01/31/2018 0531   CO2 22 01/31/2018 0531   GLUCOSE 118 (H) 01/31/2018 0531   BUN 17 01/31/2018 0531   BUN 17 07/05/2017   CREATININE 2.70 (H) 01/31/2018 0531   CREATININE 1.64 (H) 07/05/2017 1157   CALCIUM 8.6 (L) 01/31/2018 0531   GFRNONAA 28 (L) 01/31/2018 0531   GFRNONAA 52 (L) 07/05/2017 1157   GFRAA 32 (L) 01/31/2018 0531   GFRAA 60 07/05/2017 1157  CBC    Component Value Date/Time   WBC 8.9 01/31/2018 0531   RBC 3.51 (L) 01/31/2018 0531   HGB 10.4 (L) 01/31/2018 0531   HCT 31.3 (L) 01/31/2018 0531   HCT 30.7 (L) 03/30/2015 0616   PLT 472 (H) 01/31/2018 0531   MCV 89.2 01/31/2018 0531   MCH 29.6 01/31/2018 0531   MCHC 33.2 01/31/2018 0531   RDW 11.9 01/31/2018 0531   LYMPHSABS 1.7 01/11/2017 0121   MONOABS 1.2 (H) 01/11/2017 0121   EOSABS 0.0 01/11/2017 0121   BASOSABS 0.0 01/11/2017 0121    Assessment/Plan: 1. 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 Pt produced over 2 liters of urine and renal US was without obstruction. Nothing more to do but to monitor his renal function and avoid any further nephrotoxic exposure.  1. Cr markedly improved 2. Stable for discharge from renal standpoint. 2. STEMI- s/p PTCA and DES placement of 100% occluded LCx. Improved symptoms 3. DM with renal complications- stressed importance of diabetic control 4. Tobacco abuse- stressed smoking  cessation 5. Anemia- presumably due to anemia of CKD stage 3 with diabetes. iron stores are low and would recommend IV feraheme if ok with primary svc and follow.  6. PVD s/p amputation of right 5th toe due to diabetic foot ulcer 7. Neuropathy- on gabapentin 8. Disposition- improving and stable for discharge.  Will contact pt for follow up with me in our office.  Business card was given to the patient.    Donetta Potts, MD [employer] [phone]
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
## 2018-01-31 NOTE — Discharge Summary (Signed)
Discharge summary dictated on 01/31/2018, dictation number is [id number]

## 2018-01-31 NOTE — Discharge Instructions (Signed)
Acute Coronary Syndrome °Acute coronary syndrome (ACS) is a serious problem in which there is suddenly not enough blood and oxygen reaching the heart. ACS can result in chest pain or a heart attack. °What are the causes? °This condition may be caused by: °· A buildup of fat and cholesterol inside of the arteries (atherosclerosis). This is the most common cause. The buildup (plaque) can cause the blood vessels in your heart (coronary arteries) to become narrow or blocked. Plaque can also break off to form a clot. °· A coronary spasm. °· A tearing of the coronary artery (spontaneous coronary artery dissection). °· Low blood pressure (hypotension). °· An abnormal heart beat (arrhythmia). °· Using cocaine or methamphetamine. ° °What increases the risk? °The following factors may make you more likely to develop this condition: °· Age. °· History of chest pain, heart attack, or stroke. °· Being male. °· Family history of chest pain, heart disease, or stroke. °· Smoking. °· Inactivity. °· Being overweight. °· High cholesterol. °· High blood pressure (hypertension). °· Diabetes. °· Excessive alcohol use. ° °What are the signs or symptoms? °Common symptoms of this condition include: °· Chest pain. The pain may last long, or may stop and come back (recur). It may feel like: °? Crushing or squeezing. °? Tightness, pressure, fullness, or heaviness. °· Arm, neck, jaw, or back pain. °· Heartburn or indigestion. °· Shortness of breath. °· Nausea. °· Sudden cold sweats. °· Lightheadedness. °· Dizziness. °· Tiredness (fatigue). ° °Sometimes there are no symptoms. °How is this diagnosed? °This condition may be diagnosed through: °· An electrocardiogram (ECG). This test records the impulses of the heart. °· Blood tests. °· A CT scan of the chest. °· A coronary angiogram. This procedure checks for a blockage in the coronary arteries. ° °How is this treated? °Treatment for this condition may include: °· Oxygen. °· Medicines, such  as: °? Antiplatelet medicines and blood-thinning medicines, such as aspirin. These help prevent blood clots. °? Fibrinolytic therapy. This breaks apart a blood clot. °? Blood pressure medicines. °? Nitroglycerin. °? Pain medicine. °? Cholesterol medicine. °· A procedure called coronary angioplasty and stenting. This is done to widen a narrowed artery and keep it open. °· Coronary artery bypass surgery. This allows blood to pass the blockage to reach your heart. °· Cardiac rehabilitation. This is a program that helps improve your health and well-being. It includes exercise training, education, and counseling to help you recover. ° °Follow these instructions at home: °Eating and drinking °· Follow a heart-healthy, low-salt (sodium) diet. °· Use healthy cooking methods such as roasting, grilling, broiling, baking, poaching, steaming, or stir-frying. °· Talk to a dietitian to learn about healthy cooking methods and how to eat less sodium. °Medicines °· Take over-the-counter and prescription medicines only as told by your health care provider. °· Do not take these medicines unless your health care provider approves: °? Nonsteroidal anti-inflammatory drugs (NSAIDs), such as ibuprofen, naproxen, or celecoxib. °? Vitamin supplements that contain vitamin A or vitamin E. °? Hormone replacement therapy that contains estrogen. °Activity °· Join a cardiac rehabilitation program. °· Ask your health care provider: °? What activities and exercises are safe for you. °? If you should follow specific instructions about lifting, driving, or climbing stairs. °· If you are taking aspirin and another blood thinning medicine, avoid activities that are likely to result in an injury. The medicines can increase your risk of bleeding. °Lifestyle °· Do not use any products that contain nicotine or tobacco, such as cigarettes   and e-cigarettes. If you need help quitting, ask your health care provider. °· If you drink alcohol and your health care  provider says it is okay to drink, limit your alcohol intake to no more than 1 drink per day. One drink equals 12 oz of beer, 5 oz of wine, or 1½ oz of hard liquor. °· Maintain a healthy weight. If you need to lose weight, do it in a way that has been approved by your health care provider. °General instructions °· Tell all your health care providers about your heart condition, including your dentist. Some medicines can increase your risk of arrhythmia. °· Manage other health conditions, such as hypertension and diabetes. These conditions affect your heart. °· Learn ways to manage stress. °· Get screened for depression, and seek treatment if needed. °· Monitor your blood pressure if told by your health care provider. °· Keep your vaccinations up to date. Get the annual influenza vaccine. °· Keep all follow-up visits as told by your health care provider. This is important. °Contact a health care provider if: °· You feel overwhelmed or sad. °· You have trouble with your daily activities. °Get help right away if: °· You have pain in your chest, neck, arm, jaw, stomach, or back that recurs, and: °? Lasts more than a few minutes. °? Is not relieved by taking the medicineyour health care provider prescribed. °· You have unexplained: °? Heavy sweating. °? Heartburn or indigestion. °? Shortness of breath. °? Difficulty breathing. °? Nausea or vomiting. °? Fatigue. °? Nervousness or anxiety. °? Weakness. °? Diarrhea. °? Dark stools or blood in the stool. °· You have sudden lightheadedness or dizziness. °· Your blood pressure is higher than 180/120 °· You faint. °· You feel like hurting yourself or think about taking your own life. °These symptoms may represent a serious problem that is an emergency. Do not wait to see if the symptoms will go away. Get medical help right away. Call your local emergency services (911 in the [place] Do not drive yourself to the clinic or hospital. °Summary °· Acute coronary syndrome (ACS) is a  when there is not enough blood and oxygen being supplied to the heart. ACS can result in chest pain or a heart attack. °· Acute coronary syndrome is a medical emergency. If you have any symptoms of this condition, get help right away. °· Treatment includes oxygen, medicines, and procedures to open the blocked arteries and restore blood flow. °This information is not intended to replace advice given to you by your health care provider. Make sure you discuss any questions you have with your health care provider. °Document Released: 10/15/2005 Document Revised: 11/16/2016 Document Reviewed: 11/16/2016 °[employer] Interactive Patient Education © 2018 [employer] ° °Coronary Angiogram With Stent °Coronary angiogram with stent placement is a procedure to widen or open a narrow blood vessel of the heart (coronary artery). Arteries may become blocked by cholesterol buildup (plaques) in the lining of the wall. When a coronary artery becomes partially blocked, blood flow to that area decreases. This may lead to chest pain or a heart attack (myocardial infarction). °A stent is a small piece of metal that looks like mesh or a spring. Stent placement may be done as treatment for a heart attack or right after a coronary angiogram in which a blocked artery is found. °Let your health care provider know about: °· Any allergies you have. °· All medicines you are taking, including vitamins, herbs, eye drops, creams, and over-the-counter medicines. °· Any   problems you or family members have had with anesthetic medicines. °· Any blood disorders you have. °· Any surgeries you have had. °· Any medical conditions you have. °· Whether you are pregnant or may be pregnant. °What are the risks? °Generally, this is a safe procedure. However, problems may occur, including: °· Damage to the heart or its blood vessels. °· A return of blockage. °· Bleeding, infection, or bruising at the insertion site. °· A collection of blood under the skin  (hematoma) at the insertion site. °· A blood clot in another part of the body. °· Kidney injury. °· Allergic reaction to the dye or contrast that is used. °· Bleeding into the abdomen (retroperitoneal bleeding). ° °What happens before the procedure? °Staying hydrated °Follow instructions from your health care provider about hydration, which may include: °· Up to 2 hours before the procedure - you may continue to drink clear liquids, such as water, clear fruit juice, black coffee, and plain tea. ° °Eating and drinking restrictions °Follow instructions from your health care provider about eating and drinking, which may include: °· 8 hours before the procedure - stop eating heavy meals or foods such as meat, fried foods, or fatty foods. °· 6 hours before the procedure - stop eating light meals or foods, such as toast or cereal. °· 2 hours before the procedure - stop drinking clear liquids. ° °Ask your health care provider about: °· Changing or stopping your regular medicines. This is especially important if you are taking diabetes medicines or blood thinners. °· Taking medicines such as ibuprofen. These medicines can thin your blood. Do not take these medicines before your procedure if your health care provider instructs you not to. Generally, aspirin is recommended before a procedure of passing a small, thin tube (catheter) through a blood vessel and into the heart (cardiac catheterization). ° °What happens during the procedure? °· An IV tube will be inserted into one of your veins. °· You will be given one or more of the following: °? A medicine to help you relax (sedative). °? A medicine to numb the area where the catheter will be inserted into an artery (local anesthetic). °· To reduce your risk of infection: °? Your health care team will wash or sanitize their hands. °? Your skin will be washed with soap. °? Hair may be removed from the area where the catheter will be inserted. °· Using a guide wire, the catheter  will be inserted into an artery. The location may be in your groin, in your wrist, or in the fold of your arm (near your elbow). °· A type of X-ray (fluoroscopy) will be used to help guide the catheter to the opening of the arteries in the heart. °· A dye will be injected into the catheter, and X-rays will be taken. The dye will help to show where any narrowing or blockages are located in the arteries. °· A tiny wire will be guided to the blocked spot, and a balloon will be inflated to make the artery wider. °· The stent will be expanded and will crush the plaques into the wall of the vessel. The stent will hold the area open and improve the blood flow. Most stents have a drug coating to reduce the risk of the stent narrowing over time. °· The artery may be made wider using a drill, laser, or other tools to remove plaques. °· When the blood flow is better, the catheter will be removed. The lining of the artery will   grow over the stent, which stays where it was placed. °This procedure may vary among health care providers and hospitals. °What happens after the procedure? °· If the procedure is done through the leg, you will be kept in bed lying flat for about 6 hours. You will be instructed to not bend and not cross your legs. °· The insertion site will be checked frequently. °· The pulse in your foot or wrist will be checked frequently. °· You may have additional blood tests, X-rays, and a test that records the electrical activity of your heart (electrocardiogram, or ECG). °This information is not intended to replace advice given to you by your health care provider. Make sure you discuss any questions you have with your health care provider. °Document Released: 04/21/2003 Document Revised: 06/14/2016 Document Reviewed: 05/20/2016 °Elsevier Interactive Patient Education © 2018 [employer] ° °

## 2018-02-23 ENCOUNTER — Other Ambulatory Visit: Payer: Self-pay | Admitting: "Endocrinology

## 2018-02-27 ENCOUNTER — Ambulatory Visit: Payer: Medicaid Other | Admitting: Internal Medicine

## 2018-02-27 ENCOUNTER — Encounter: Payer: Self-pay | Admitting: Internal Medicine

## 2018-02-27 VITALS — BP 138/76 | HR 86 | Ht 71.0 in | Wt 210.6 lb

## 2018-02-27 DIAGNOSIS — E1159 Type 2 diabetes mellitus with other circulatory complications: Secondary | ICD-10-CM

## 2018-02-27 DIAGNOSIS — E1165 Type 2 diabetes mellitus with hyperglycemia: Secondary | ICD-10-CM | POA: Diagnosis not present

## 2018-02-27 MED ORDER — INSULIN ASPART 100 UNIT/ML FLEXPEN: 5.0000 [IU] | PEN_INJECTOR | Freq: Three times a day (TID) | SUBCUTANEOUS | 5 refills | Status: DC

## 2018-02-27 MED ORDER — GLUCOSE BLOOD VI STRP: ORAL_STRIP | 3 refills | Status: AC

## 2018-02-27 MED ORDER — ACCU-CHEK FASTCLIX LANCETS MISC: 3 refills | Status: AC

## 2018-02-27 MED ORDER — INSULIN GLARGINE 100 UNIT/ML SOLOSTAR PEN: 25.0000 [IU] | PEN_INJECTOR | Freq: Every day | SUBCUTANEOUS | 5 refills | Status: DC

## 2018-02-27 NOTE — Patient Instructions (Addendum)
Please start: - Lantus 25 units at bedtime - Humalog  5 units before a smaller meal 7 units before a larger meal  Please let me know if the sugars are consistently <80 or >200.  Please return on June 10th at 1:30 pm.  PATIENT INSTRUCTIONS FOR TYPE 2 DIABETES:  **Please join MyChart!** - see attached instructions about how to join if you have not done so already.  DIET AND EXERCISE Diet and exercise is an important part of diabetic treatment.  We recommended aerobic exercise in the form of brisk walking (working between 40-60% of maximal aerobic capacity, similar to brisk walking) for 150 minutes per week (such as 30 minutes five days per week) along with 3 times per week performing 'resistance' training (using various gauge rubber tubes with handles) 5-10 exercises involving the major muscle groups (upper body, lower body and core) performing 10-15 repetitions (or near fatigue) each exercise. Start at half the above goal but build slowly to reach the above goals. If limited by weight, joint pain, or disability, we recommend daily walking in a swimming pool with water up to waist to reduce pressure from joints while allow for adequate exercise.    BLOOD GLUCOSES Monitoring your blood glucoses is important for continued management of your diabetes. Please check your blood glucoses 2-4 times a day: fasting, before meals and at bedtime (you can rotate these measurements - e.g. one day check before the 3 meals, the next day check before 2 of the meals and before bedtime, etc.).   HYPOGLYCEMIA (low blood sugar) Hypoglycemia is usually a reaction to not eating, exercising, or taking too much insulin/ other diabetes drugs.  Symptoms include tremors, sweating, hunger, confusion, headache, etc. Treat IMMEDIATELY with 15 grams of Carbs: . 4 glucose tablets .  cup regular juice/soda . 2 tablespoons raisins . 4 teaspoons sugar . 1 tablespoon honey Recheck blood glucose in 15 mins and repeat above  if still symptomatic/blood glucose <100.  RECOMMENDATIONS TO REDUCE YOUR RISK OF DIABETIC COMPLICATIONS: * Take your prescribed MEDICATION(S) * Follow a DIABETIC diet: Complex carbs, fiber rich foods, (monounsaturated and polyunsaturated) fats * AVOID saturated/trans fats, high fat foods, >2,300 mg salt per day. * EXERCISE at least 5 times a week for 30 minutes or preferably daily.  * DO NOT SMOKE OR DRINK more than 1 drink a day. * Check your FEET every day. Do not wear tightfitting shoes. Contact us if you develop an ulcer * See your EYE doctor once a year or more if needed * Get a FLU shot once a year * Get a PNEUMONIA vaccine once before and once after age 3 years  GOALS:  * Your Hemoglobin A1c of <7%  * fasting sugars need to be <130 * after meals sugars need to be <180 (2h after you start eating) * Your Systolic BP should be 371 or lower  * Your Diastolic BP should be 80 or lower  * Your HDL (Good Cholesterol) should be 40 or higher  * Your LDL (Bad Cholesterol) should be 100 or lower. * Your Triglycerides should be 150 or lower  * Your Urine microalbumin (kidney function) should be <30 * Your Body Mass Index should be 25 or lower    Please consider the following ways to cut down carbs and fat and increase fiber and micronutrients in your diet: - substitute whole grain for white bread or pasta - substitute Gilberg rice for white rice - substitute 90-calorie flat bread pieces for slices of  bread when possible - substitute sweet potatoes or yams for white potatoes - substitute humus for margarine - substitute tofu for cheese when possible - substitute almond or rice milk for regular milk (would not drink soy milk daily due to concern for soy estrogen influence on breast cancer risk) - substitute dark chocolate for other sweets when possible - substitute water - can add lemon or orange slices for taste - for diet sodas (artificial sweeteners will trick your body that you can eat  sweets without getting calories and will lead you to overeating and weight gain in the long run) - do not skip breakfast or other meals (this will slow down the metabolism and will result in more weight gain over time)  - can try smoothies made from fruit and almond/rice milk in am instead of regular breakfast - can also try old-fashioned (not instant) oatmeal made with almond/rice milk in am - order the dressing on the side when eating salad at a restaurant (pour less than half of the dressing on the salad) - eat as little meat as possible - can try juicing, but should not forget that juicing will get rid of the fiber, so would alternate with eating raw veg./fruits or drinking smoothies - use as little oil as possible, even when using olive oil - can dress a salad with a mix of balsamic vinegar and lemon juice, for e.g. - use agave nectar, stevia sugar, or regular sugar rather than artificial sweateners - steam or broil/roast veggies  - snack on veggies/fruit/nuts (unsalted, preferably) when possible, rather than processed foods - reduce or eliminate aspartame in diet (it is in diet sodas, chewing gum, etc) Read the labels!  Try to read Dr. Janene Harvey book: "Program for Reversing Diabetes" for other ideas for healthy eating.
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
## 2018-03-05 ENCOUNTER — Encounter (HOSPITAL_COMMUNITY)
Admission: RE | Admit: 2018-03-05 | Discharge: 2018-03-05 | Disposition: A | Discharge status: Home / Self Care | Payer: Medicaid Other | Source: Ambulatory Visit | Attending: Nephrology | Admitting: Nephrology

## 2018-03-05 DIAGNOSIS — N189 Chronic kidney disease, unspecified: Secondary | ICD-10-CM | POA: Diagnosis not present

## 2018-03-05 DIAGNOSIS — D631 Anemia in chronic kidney disease: Secondary | ICD-10-CM | POA: Insufficient documentation

## 2018-03-05 MED ORDER — SODIUM CHLORIDE 0.9 % IV SOLN
INTRAVENOUS | Status: DC
  Administered 2018-03-05: 250 mL via INTRAVENOUS

## 2018-03-05 MED ORDER — SODIUM CHLORIDE 0.9 % IV SOLN
510.0000 mg | Freq: Once | INTRAVENOUS | Status: AC
  Administered 2018-03-05: 510 mg via INTRAVENOUS
  Filled 2018-03-05: qty 17

## 2018-03-12 ENCOUNTER — Encounter (HOSPITAL_COMMUNITY)
Admission: RE | Admit: 2018-03-12 | Discharge: 2018-03-12 | Disposition: A | Discharge status: Home / Self Care | Payer: Medicaid Other | Source: Ambulatory Visit | Attending: Nephrology | Admitting: Nephrology

## 2018-03-12 DIAGNOSIS — N189 Chronic kidney disease, unspecified: Secondary | ICD-10-CM | POA: Diagnosis not present

## 2018-03-12 MED ORDER — SODIUM CHLORIDE 0.9 % IV SOLN
INTRAVENOUS | Status: DC
  Administered 2018-03-12: 250 mL via INTRAVENOUS

## 2018-03-12 MED ORDER — SODIUM CHLORIDE 0.9 % IV SOLN
510.0000 mg | Freq: Once | INTRAVENOUS | Status: AC
  Administered 2018-03-12: 510 mg via INTRAVENOUS
  Filled 2018-03-12: qty 17

## 2018-03-17 ENCOUNTER — Encounter: Payer: Self-pay | Admitting: Podiatry

## 2018-03-17 ENCOUNTER — Ambulatory Visit: Payer: Medicaid Other | Admitting: Podiatry

## 2018-03-17 DIAGNOSIS — L84 Corns and callosities: Secondary | ICD-10-CM | POA: Diagnosis not present

## 2018-03-17 DIAGNOSIS — M79674 Pain in right toe(s): Secondary | ICD-10-CM

## 2018-03-17 DIAGNOSIS — B351 Tinea unguium: Secondary | ICD-10-CM

## 2018-03-17 DIAGNOSIS — M79675 Pain in left toe(s): Secondary | ICD-10-CM

## 2018-03-17 DIAGNOSIS — E1149 Type 2 diabetes mellitus with other diabetic neurological complication: Secondary | ICD-10-CM | POA: Diagnosis not present

## 2018-03-19 ENCOUNTER — Telehealth: Payer: Self-pay | Admitting: *Deleted

## 2018-03-19 DIAGNOSIS — L989 Disorder of the skin and subcutaneous tissue, unspecified: Secondary | ICD-10-CM

## 2018-03-19 NOTE — Progress Notes (Addendum)
Subjective: 42 y.o. returns the office today for painful, elongated, thickened toenails which he cannot trim himself. Denies any redness or drainage around the nails.  He states he still has a corn to the top of the right fourth toe . He has no other concerns today.  Denies any drainage or swelling to his feet.  Objective: AAO 3, NAD DP/PT pulses palpable, CRT less than 3 seconds Protective sensation decreased with Simms Weinstein monofilament Nails hypertrophic, dystrophic, elongated, brittle, discolored 9. There is tenderness overlying the nails 1-5 bilaterally except the right 5th which has been amputated. Amputation site is well-healed. Hyperkeratotic lesion of the dorsal right fourth toe and distal right hallux.  Upon debridement there is no underlying ulceration, drainage or any clinical signs of infection.  On the proximal lateral aspect the left leg there is a hyperpigmented lesion measuring about 1 x 1 cm region is thickened but has uniform borders.  Subjective this is been ongoing for a long time but he is never had it checked. No other open lesions or pre-ulcerative lesions are identified. No other areas of tenderness bilateral lower extremities. No overlying edema, erythema, increased warmth. No pain with calf compression, swelling, warmth, erythema.  Assessment: Patient presents with symptomatic onychomycosis; pre-ulcerative calluses with uncontrolled diabetes and neuropathy; hyperpigmentation left leg  Plan: -Treatment options including alternatives, risks, complications were discussed -Hyperkeratotic lesion sharply debrided x1 without any complications or bleeding.  Overall this appears to be improved today.  Offloading at all times and dispensed offloading pads for him today. -Nails sharply debrided 9 without complication/bleeding.  -Discussed biopsy for hyperpigmented lesion left leg.  He would like to see dermatology for this.  Referral to be placed.  -Discussed daily foot  inspection. If there are any changes, to call the office immediately.  -Follow-up in 9 weeks at this point or sooner if any problems are to arise. In the meantime, encouraged to call the office with any questions, concerns, changes symptoms.  Celesta Gentile, DPM

## 2018-03-19 NOTE — Telephone Encounter (Signed)
Referral, clinicals and demographics to [facility].

## 2018-03-19 NOTE — Telephone Encounter (Signed)
-----   Message from Trula Slade, DPM sent at 03/19/2018  3:08 PM EDT ----- Can you please put in for a referral for dermatology due to a hyperpigmented lesion on the left leg? Thanks.

## 2018-03-27 ENCOUNTER — Encounter (HOSPITAL_COMMUNITY): Payer: Medicaid Other | Attending: Cardiology

## 2018-03-31 ENCOUNTER — Encounter (HOSPITAL_COMMUNITY): Payer: Self-pay | Admitting: Emergency Medicine

## 2018-03-31 ENCOUNTER — Emergency Department (HOSPITAL_COMMUNITY)
Admission: EM | Admit: 2018-03-31 | Discharge: 2018-03-31 | Disposition: A | Discharge status: Home / Self Care | Payer: Medicaid Other | Attending: Emergency Medicine | Admitting: Emergency Medicine

## 2018-03-31 ENCOUNTER — Telehealth: Payer: Self-pay | Admitting: Podiatry

## 2018-03-31 ENCOUNTER — Emergency Department (HOSPITAL_COMMUNITY): Payer: Medicaid Other

## 2018-03-31 DIAGNOSIS — Z79899 Other long term (current) drug therapy: Secondary | ICD-10-CM | POA: Insufficient documentation

## 2018-03-31 DIAGNOSIS — E119 Type 2 diabetes mellitus without complications: Secondary | ICD-10-CM | POA: Diagnosis not present

## 2018-03-31 DIAGNOSIS — F17210 Nicotine dependence, cigarettes, uncomplicated: Secondary | ICD-10-CM | POA: Insufficient documentation

## 2018-03-31 DIAGNOSIS — Z7982 Long term (current) use of aspirin: Secondary | ICD-10-CM | POA: Insufficient documentation

## 2018-03-31 DIAGNOSIS — N189 Chronic kidney disease, unspecified: Secondary | ICD-10-CM | POA: Diagnosis not present

## 2018-03-31 DIAGNOSIS — J069 Acute upper respiratory infection, unspecified: Secondary | ICD-10-CM | POA: Insufficient documentation

## 2018-03-31 DIAGNOSIS — Z794 Long term (current) use of insulin: Secondary | ICD-10-CM | POA: Insufficient documentation

## 2018-03-31 DIAGNOSIS — R05 Cough: Secondary | ICD-10-CM | POA: Diagnosis present

## 2018-03-31 DIAGNOSIS — B9789 Other viral agents as the cause of diseases classified elsewhere: Secondary | ICD-10-CM

## 2018-03-31 DIAGNOSIS — L819 Disorder of pigmentation, unspecified: Secondary | ICD-10-CM

## 2018-03-31 LAB — CBC WITH DIFFERENTIAL/PLATELET
BASOS PCT: 0 %
Basophils Absolute: 0 10*3/uL (ref 0.0–0.1)
EOS ABS: 0.1 10*3/uL (ref 0.0–0.7)
EOS PCT: 2 %
HCT: 33 % — ABNORMAL LOW (ref 39.0–52.0)
HEMOGLOBIN: 10.9 g/dL — AB (ref 13.0–17.0)
Lymphocytes Relative: 16 %
Lymphs Abs: 0.9 10*3/uL (ref 0.7–4.0)
MCH: 29.6 pg (ref 26.0–34.0)
MCHC: 33 g/dL (ref 30.0–36.0)
MCV: 89.7 fL (ref 78.0–100.0)
MONOS PCT: 14 %
Monocytes Absolute: 0.8 10*3/uL (ref 0.1–1.0)
Neutro Abs: 4.1 10*3/uL (ref 1.7–7.7)
Neutrophils Relative %: 68 %
PLATELETS: 195 10*3/uL (ref 150–400)
RBC: 3.68 MIL/uL — AB (ref 4.22–5.81)
RDW: 13.3 % (ref 11.5–15.5)
WBC: 6 10*3/uL (ref 4.0–10.5)

## 2018-03-31 LAB — BASIC METABOLIC PANEL
Anion gap: 4 — ABNORMAL LOW (ref 5–15)
BUN: 27 mg/dL — ABNORMAL HIGH (ref 6–20)
CALCIUM: 8.6 mg/dL — AB (ref 8.9–10.3)
CO2: 26 mmol/L (ref 22–32)
CREATININE: 1.98 mg/dL — AB (ref 0.61–1.24)
Chloride: 104 mmol/L (ref 101–111)
GFR calc Af Amer: 47 mL/min — ABNORMAL LOW (ref >60)
GFR calc non Af Amer: 41 mL/min — ABNORMAL LOW (ref >60)
Glucose, Bld: 246 mg/dL — ABNORMAL HIGH (ref 65–99)
Potassium: 5.5 mmol/L — ABNORMAL HIGH (ref 3.5–5.1)
SODIUM: 134 mmol/L — AB (ref 135–145)

## 2018-03-31 MED ORDER — BENZONATATE 200 MG PO CAPS: 200.0000 mg | ORAL_CAPSULE | Freq: Three times a day (TID) | ORAL | 0 refills | Status: DC | PRN

## 2018-03-31 NOTE — Telephone Encounter (Signed)
[facility] called and stated that they couldn't see the pt because they don't accept medicaid.

## 2018-03-31 NOTE — ED Triage Notes (Signed)
Pt reports cough and congestion 2 days ago.  C/o pain in chest with cough.

## 2018-03-31 NOTE — Discharge Instructions (Addendum)
Drink plenty of water.  Tylenol every 4 hrs if needed for fever.  Follow-up with your primary doctor for recheck.  Return for any worsening symptoms

## 2018-04-01 ENCOUNTER — Encounter: Payer: Self-pay | Admitting: *Deleted

## 2018-04-01 NOTE — Telephone Encounter (Signed)
[facility] states they do accept Medicaid, have a [place] and [place] location.

## 2018-04-01 NOTE — Addendum Note (Signed)
Addended by: Harriett Sine D on: 04/01/2018 11:32 AM   Modules accepted: Orders

## 2018-04-01 NOTE — Telephone Encounter (Signed)
Unable to leave message to inform pt of the referral to [facility]. Mailed note to pt with copy of referral.
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
## 2018-04-07 ENCOUNTER — Ambulatory Visit (INDEPENDENT_AMBULATORY_CARE_PROVIDER_SITE_OTHER): Payer: Medicaid Other | Admitting: Internal Medicine

## 2018-04-07 ENCOUNTER — Encounter: Payer: Self-pay | Admitting: Internal Medicine

## 2018-04-07 VITALS — BP 112/82 | HR 75 | Ht 71.0 in | Wt 213.8 lb

## 2018-04-07 DIAGNOSIS — E1165 Type 2 diabetes mellitus with hyperglycemia: Secondary | ICD-10-CM

## 2018-04-07 DIAGNOSIS — E782 Mixed hyperlipidemia: Secondary | ICD-10-CM | POA: Diagnosis not present

## 2018-04-07 DIAGNOSIS — E1159 Type 2 diabetes mellitus with other circulatory complications: Secondary | ICD-10-CM

## 2018-04-07 DIAGNOSIS — E785 Hyperlipidemia, unspecified: Secondary | ICD-10-CM | POA: Insufficient documentation

## 2018-04-07 LAB — POCT GLYCOSYLATED HEMOGLOBIN (HGB A1C): Hemoglobin A1C: 11.7 % — AB (ref 4.0–5.6)

## 2018-04-07 MED ORDER — SEMAGLUTIDE(0.25 OR 0.5MG/DOS) 2 MG/1.5ML ~~LOC~~ SOPN: 0.2500 mg | PEN_INJECTOR | SUBCUTANEOUS | 5 refills | Status: DC

## 2018-04-07 NOTE — Addendum Note (Signed)
Addended by: Drucilla Schmidt on: 04/07/2018 04:22 PM   Modules accepted: Orders
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
## 2018-04-07 NOTE — Patient Instructions (Addendum)
Please increase: - Lantus to 35 units at bedtime - Novolog 8-10 units before meals  Start: - Ozempic 0.25 mg weekly x4 weeks, then 0.5 mg weekly  Please return in 1.5 months with your sugar log.

## 2018-04-08 ENCOUNTER — Telehealth: Payer: Self-pay

## 2018-04-08 ENCOUNTER — Other Ambulatory Visit: Payer: Self-pay | Admitting: Internal Medicine

## 2018-04-08 NOTE — Telephone Encounter (Signed)
Derrick Mosley, let us have him come by and pick up 2 pens and I will discuss with him about changing to Trulicity when he returns to see me in 1.5 months.  We absolutely need to lower his sugars as fast as possible for back surgery.

## 2018-04-08 NOTE — Telephone Encounter (Signed)
Noted  

## 2018-04-08 NOTE — Telephone Encounter (Signed)
I think I gave him one

## 2018-04-08 NOTE — Telephone Encounter (Signed)
Pt is aware samples of Ozempic will be given, and can give him coupon card?

## 2018-04-08 NOTE — Telephone Encounter (Signed)
Ozempic is not covered with his insurance. Please advise

## 2018-04-16 ENCOUNTER — Telehealth: Payer: Self-pay | Admitting: Internal Medicine

## 2018-04-16 NOTE — Telephone Encounter (Signed)
[employer] is calling to see if PA has been sent for patients medi cation,. Please advise   Semaglutide (OZEMPIC) 0.25 or 0.5 MG/DOSE SOPN     PHONE_ [phone] REF- [id number]

## 2018-04-17 NOTE — Telephone Encounter (Signed)
Pending pts trial of medication before we take next step and pt has coupon card.

## 2018-04-18 ENCOUNTER — Emergency Department (HOSPITAL_COMMUNITY): Payer: Medicaid Other

## 2018-04-18 ENCOUNTER — Other Ambulatory Visit: Payer: Self-pay

## 2018-04-18 ENCOUNTER — Encounter (HOSPITAL_COMMUNITY): Payer: Self-pay | Admitting: Emergency Medicine

## 2018-04-18 ENCOUNTER — Emergency Department (HOSPITAL_COMMUNITY)
Admission: EM | Admit: 2018-04-18 | Discharge: 2018-04-18 | Disposition: A | Discharge status: Home / Self Care | Payer: Medicaid Other | Attending: Emergency Medicine | Admitting: Emergency Medicine

## 2018-04-18 DIAGNOSIS — Z7982 Long term (current) use of aspirin: Secondary | ICD-10-CM | POA: Diagnosis not present

## 2018-04-18 DIAGNOSIS — N289 Disorder of kidney and ureter, unspecified: Secondary | ICD-10-CM | POA: Insufficient documentation

## 2018-04-18 DIAGNOSIS — E1122 Type 2 diabetes mellitus with diabetic chronic kidney disease: Secondary | ICD-10-CM | POA: Diagnosis not present

## 2018-04-18 DIAGNOSIS — E1159 Type 2 diabetes mellitus with other circulatory complications: Secondary | ICD-10-CM | POA: Diagnosis not present

## 2018-04-18 DIAGNOSIS — Z89421 Acquired absence of other right toe(s): Secondary | ICD-10-CM | POA: Insufficient documentation

## 2018-04-18 DIAGNOSIS — Z794 Long term (current) use of insulin: Secondary | ICD-10-CM | POA: Diagnosis not present

## 2018-04-18 DIAGNOSIS — Z79899 Other long term (current) drug therapy: Secondary | ICD-10-CM | POA: Diagnosis not present

## 2018-04-18 DIAGNOSIS — F17210 Nicotine dependence, cigarettes, uncomplicated: Secondary | ICD-10-CM | POA: Diagnosis not present

## 2018-04-18 DIAGNOSIS — N189 Chronic kidney disease, unspecified: Secondary | ICD-10-CM | POA: Insufficient documentation

## 2018-04-18 DIAGNOSIS — R531 Weakness: Secondary | ICD-10-CM | POA: Diagnosis present

## 2018-04-18 DIAGNOSIS — K529 Noninfective gastroenteritis and colitis, unspecified: Secondary | ICD-10-CM | POA: Diagnosis not present

## 2018-04-18 LAB — CBC WITH DIFFERENTIAL/PLATELET
BASOS PCT: 0 %
Basophils Absolute: 0 10*3/uL (ref 0.0–0.1)
EOS PCT: 1 %
Eosinophils Absolute: 0.1 10*3/uL (ref 0.0–0.7)
HCT: 36.4 % — ABNORMAL LOW (ref 39.0–52.0)
Hemoglobin: 12.5 g/dL — ABNORMAL LOW (ref 13.0–17.0)
Lymphocytes Relative: 18 %
Lymphs Abs: 1.8 10*3/uL (ref 0.7–4.0)
MCH: 30.1 pg (ref 26.0–34.0)
MCHC: 34.3 g/dL (ref 30.0–36.0)
MCV: 87.7 fL (ref 78.0–100.0)
MONOS PCT: 8 %
Monocytes Absolute: 0.9 10*3/uL (ref 0.1–1.0)
Neutro Abs: 7.4 10*3/uL (ref 1.7–7.7)
Neutrophils Relative %: 73 %
PLATELETS: 276 10*3/uL (ref 150–400)
RBC: 4.15 MIL/uL — ABNORMAL LOW (ref 4.22–5.81)
RDW: 13 % (ref 11.5–15.5)
WBC: 10.2 10*3/uL (ref 4.0–10.5)

## 2018-04-18 LAB — COMPREHENSIVE METABOLIC PANEL
ALK PHOS: 85 U/L (ref 38–126)
ALT: 23 U/L (ref 17–63)
AST: 22 U/L (ref 15–41)
Albumin: 3.7 g/dL (ref 3.5–5.0)
Anion gap: 7 (ref 5–15)
BILIRUBIN TOTAL: 0.5 mg/dL (ref 0.3–1.2)
BUN: 33 mg/dL — ABNORMAL HIGH (ref 6–20)
CALCIUM: 8.6 mg/dL — AB (ref 8.9–10.3)
CO2: 23 mmol/L (ref 22–32)
CREATININE: 1.94 mg/dL — AB (ref 0.61–1.24)
Chloride: 102 mmol/L (ref 101–111)
GFR calc non Af Amer: 42 mL/min — ABNORMAL LOW (ref >60)
GFR, EST AFRICAN AMERICAN: 48 mL/min — AB (ref >60)
Glucose, Bld: 208 mg/dL — ABNORMAL HIGH (ref 65–99)
Potassium: 6.5 mmol/L (ref 3.5–5.1)
Sodium: 132 mmol/L — ABNORMAL LOW (ref 135–145)
TOTAL PROTEIN: 7.2 g/dL (ref 6.5–8.1)

## 2018-04-18 LAB — LIPASE, BLOOD: Lipase: 36 U/L (ref 11–51)

## 2018-04-18 LAB — CBG MONITORING, ED: Glucose-Capillary: 159 mg/dL — ABNORMAL HIGH (ref 65–99)

## 2018-04-18 LAB — POTASSIUM: Potassium: 4.5 mmol/L (ref 3.5–5.1)

## 2018-04-18 MED ORDER — ONDANSETRON HCL 4 MG/2ML IJ SOLN
4.0000 mg | Freq: Once | INTRAMUSCULAR | Status: AC
  Administered 2018-04-18: 4 mg via INTRAVENOUS
  Filled 2018-04-18: qty 2

## 2018-04-18 MED ORDER — SODIUM CHLORIDE 0.9 % IV SOLN
INTRAVENOUS | Status: DC
  Administered 2018-04-18: 16:00:00 via INTRAVENOUS

## 2018-04-18 MED ORDER — SODIUM CHLORIDE 0.9 % IV BOLUS
1000.0000 mL | Freq: Once | INTRAVENOUS | Status: AC
Start: 2018-04-18 — End: 2018-04-18
  Administered 2018-04-18: 1000 mL via INTRAVENOUS

## 2018-04-18 MED ORDER — PROMETHAZINE HCL 25 MG PO TABS: 25.0000 mg | ORAL_TABLET | Freq: Four times a day (QID) | ORAL | 1 refills | Status: DC | PRN

## 2018-04-18 MED ORDER — LOPERAMIDE HCL 2 MG PO TABS: 2.0000 mg | ORAL_TABLET | Freq: Four times a day (QID) | ORAL | 0 refills | Status: DC | PRN

## 2018-04-18 NOTE — ED Triage Notes (Signed)
Pt states woke up this am with generalized malaise. Denies pain.
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
## 2018-04-18 NOTE — ED Notes (Signed)
Date and time results received: 04/18/18 1640 (use smartphrase ".now" to insert current time)  Test: k+  Critical Value: 6.5  Name of Provider Notified: [email or web address] Orders Received? Or Actions Taken?: none

## 2018-04-18 NOTE — Discharge Instructions (Addendum)
The nurse will give you a dose of antinausea medicine prior to discharge.  Take the Phenergan as needed for further nausea and vomiting.  Take the Imodium for the diarrhea.  Would recommend follow-up with your primary care doctor sometime next 2 weeks to have renal function rechecked and have blood pressure rechecked.  Return for any new or worse symptoms.

## 2018-04-21 ENCOUNTER — Telehealth: Payer: Self-pay

## 2018-04-21 NOTE — Telephone Encounter (Signed)
Sol called from [facility] to check status on the PA for ozemic for this patient please contact her at [phone] with this information

## 2018-04-23 ENCOUNTER — Telehealth: Payer: Self-pay | Admitting: Internal Medicine

## 2018-04-23 NOTE — Telephone Encounter (Signed)
Derrick Mosley from [employer] calling on the status of PA for Ozempic, [phone] Ref key- [id number]

## 2018-04-25 NOTE — Telephone Encounter (Signed)
Not doing PA at this time pt is aware of this and have told [employer].

## 2018-04-25 NOTE — Telephone Encounter (Signed)
No PA needed

## 2018-04-29 ENCOUNTER — Telehealth: Payer: Self-pay

## 2018-04-29 NOTE — Telephone Encounter (Signed)
Pt was given sample of Ozempic to try and a coupon card. If medication is still to expensive he will get Trulicity 0.75mg  sent in to take weekly.

## 2018-05-06 ENCOUNTER — Ambulatory Visit: Payer: Medicaid Other | Admitting: Internal Medicine
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
## 2018-05-20 ENCOUNTER — Ambulatory Visit: Payer: Medicaid Other | Admitting: Podiatry

## 2018-05-22 ENCOUNTER — Ambulatory Visit: Payer: Medicaid Other | Admitting: Podiatry

## 2018-05-22 ENCOUNTER — Encounter: Payer: Self-pay | Admitting: Podiatry

## 2018-05-22 DIAGNOSIS — B351 Tinea unguium: Secondary | ICD-10-CM

## 2018-05-22 DIAGNOSIS — I739 Peripheral vascular disease, unspecified: Secondary | ICD-10-CM

## 2018-05-22 DIAGNOSIS — E1149 Type 2 diabetes mellitus with other diabetic neurological complication: Secondary | ICD-10-CM

## 2018-05-22 DIAGNOSIS — M79674 Pain in right toe(s): Secondary | ICD-10-CM | POA: Diagnosis not present

## 2018-05-22 DIAGNOSIS — M79675 Pain in left toe(s): Secondary | ICD-10-CM | POA: Diagnosis not present

## 2018-05-25 NOTE — Progress Notes (Signed)
Subjective: 42 y.o. returns the office today for painful, elongated, thickened toenails which he cannot trim himself. Denies any redness or drainage around the nails. Denies any acute changes since last appointment and no new complaints today. Denies any systemic complaints such as fevers, chills, nausea, vomiting.   PCP: [facility]  Objective: AAO 3, NAD Neurovascular status unchanged.  Protective sensation decreased with Derrel Nip monofilament Nails hypertrophic, dystrophic, elongated, brittle, discolored 9. There is tenderness overlying the nails 1-5 bilaterally except the right 5th toe which has been amputated. There is no surrounding erythema or drainage along the nail sites. No open lesions or pre-ulcerative lesions are identified. The incision from the prior surgery is well healed.  No open wounds today.  No pain with calf compression, swelling, warmth, erythema.  Assessment: Patient presents with symptomatic onychomycosis  Plan: -Treatment options including alternatives, risks, complications were discussed -Nails sharply debrided 9  without complication/bleeding. -Discussed daily foot inspection. If there are any changes, to call the office immediately.  -Follow-up in 3 months or sooner if any problems are to arise. In the meantime, encouraged to call the office with any questions, concerns, changes symptoms.  Celesta Gentile, DPM

## 2018-05-26 ENCOUNTER — Encounter: Payer: Self-pay | Admitting: Podiatry

## 2018-05-26 ENCOUNTER — Telehealth: Payer: Self-pay | Admitting: Podiatry

## 2018-05-26 NOTE — Telephone Encounter (Signed)
I tried calling the pt to let him know his medical records that he requested are ready for him to pick up at his convenience at the front desk. I was unable to leave a voicemail as pt's voicemail box is full.

## 2018-05-26 NOTE — Progress Notes (Signed)
Placed pt's requested medical records up front to have postage added so they can be mailed out to the pt tomorrow, Tuesday 27 May 2018. I'm mailing to pt because pt's voicemail box was full and I was unable to leave a voicemail letting him know they were ready for him to pick up.

## 2018-06-23 ENCOUNTER — Encounter: Payer: Self-pay | Admitting: Podiatry

## 2018-06-23 ENCOUNTER — Ambulatory Visit (INDEPENDENT_AMBULATORY_CARE_PROVIDER_SITE_OTHER): Payer: Medicaid Other

## 2018-06-23 ENCOUNTER — Ambulatory Visit: Payer: Medicaid Other | Admitting: Podiatry

## 2018-06-23 DIAGNOSIS — L97511 Non-pressure chronic ulcer of other part of right foot limited to breakdown of skin: Secondary | ICD-10-CM

## 2018-06-23 DIAGNOSIS — L97519 Non-pressure chronic ulcer of other part of right foot with unspecified severity: Secondary | ICD-10-CM

## 2018-06-23 DIAGNOSIS — L97512 Non-pressure chronic ulcer of other part of right foot with fat layer exposed: Secondary | ICD-10-CM | POA: Diagnosis not present

## 2018-06-23 MED ORDER — MUPIROCIN 2 % EX OINT: 1.0000 "application " | TOPICAL_OINTMENT | Freq: Two times a day (BID) | CUTANEOUS | 2 refills | Status: DC

## 2018-06-25 NOTE — Progress Notes (Signed)
Subjective: 42 year old male presents the office today for concerns of a wound at the top of the right fourth toe.  This is a new issue for him.  This is been ongoing for about 1.5 weeks.  Denies any drainage or pus coming from the area.  Denies any significant swelling.  No redness or red streaks that he can recall. Denies any systemic complaints such as fevers, chills, nausea, vomiting. No acute changes since last appointment, and no other complaints at this time.   Objective: AAO x3, NAD DP/PT pulses palpable bilaterally, CRT less than 3 seconds On the dorsal aspect of the right fourth toe there is a wound at the PIPJ measuring approximate 0.5 x 0.4 x 0.1 cm.  There is no probing to bone, undermining or tunneling.  There is underlying hammertoe contracture present.  Minimal swelling to the toe and there is no erythema there is no increase in warmth.  No pus.  There is any cellulitis.  No fluctuation or crepitation or any malodor.  Status post fifth ray amputation which is healed. No open lesions or pre-ulcerative lesions.  No pain with calf compression, swelling, warmth, erythema  Assessment: 42 year old male right fourth toe ulceration  Plan: -All treatment options discussed with the patient including all alternatives, risks, complications.  -Sharply debrided some of the wound today to remove all surrounding hyperkeratotic and nonviable tissue.  Antibiotic ointment was applied.  Prescribed mupirocin.  Offloading pads all times.  Surgical shoe dispensed. -Monitor for any clinical signs or symptoms of infection and directed to call the office immediately should any occur or go to the ER. -Patient encouraged to call the office with any questions, concerns, change in symptoms.   Trula Slade DPM

## 2018-07-03 ENCOUNTER — Encounter: Payer: Self-pay | Admitting: Podiatry

## 2018-07-03 ENCOUNTER — Ambulatory Visit (INDEPENDENT_AMBULATORY_CARE_PROVIDER_SITE_OTHER): Payer: Medicaid Other

## 2018-07-03 ENCOUNTER — Ambulatory Visit: Payer: Medicaid Other | Admitting: Podiatry

## 2018-07-03 DIAGNOSIS — L97512 Non-pressure chronic ulcer of other part of right foot with fat layer exposed: Secondary | ICD-10-CM | POA: Diagnosis not present

## 2018-07-03 DIAGNOSIS — L97519 Non-pressure chronic ulcer of other part of right foot with unspecified severity: Secondary | ICD-10-CM

## 2018-07-07 NOTE — Progress Notes (Signed)
Subjective: 42 year old male presents the office today for further evaluation of the right fourth toe wound.  He states the wound is doing better but not completely healed.  He has been using mupirocin ointment to the area.  Is more in the surgical shoe the majority of time.  Overall he is doing well he has no other concerns today. Denies any systemic complaints such as fevers, chills, nausea, vomiting. No acute changes since last appointment, and no other complaints at this time.   Objective: AAO x3, NAD DP/PT pulses palpable bilaterally, CRT less than 3 seconds Hammertoe contractures present to the right fourth toe more so than the other toes.  On the dorsal PIPJ is a hyperkeratotic lesion with central wound.  Underlying the wound appears to be getting better measures today 0.3 x 0.3 x 0.1 cm but appears to be starting to scab over.  There is decreased swelling to the toe there is no erythema warmth there is no drainage or pus identified today.  No open lesions or pre-ulcerative lesions.  No pain with calf compression, swelling, warmth, erythema  Assessment: Healing ulceration right fourth toe due to digital deformity  Plan: -All treatment options discussed with the patient including all alternatives, risks, complications.  -X-rays obtained and reviewed.  No definitive evidence of cortical destruction suggestive of osteomyelitis at this time. -Today I sharply debrided the wound utilizing #312 blade scalpel to remove all nonviable tissue down to healthy, granular tissue.  Continue mupirocin ointment dressing changes daily.  Continue with surgical shoe and offloading all times. -Monitor for any clinical signs or symptoms of infection and directed to call the office immediately should any occur or go to the ER. -Patient encouraged to call the office with any questions, concerns, change in symptoms.   Trula Slade DPM

## 2018-07-17 ENCOUNTER — Ambulatory Visit: Payer: Medicaid Other | Admitting: Podiatry

## 2018-07-17 DIAGNOSIS — L97519 Non-pressure chronic ulcer of other part of right foot with unspecified severity: Secondary | ICD-10-CM

## 2018-07-17 DIAGNOSIS — L97511 Non-pressure chronic ulcer of other part of right foot limited to breakdown of skin: Secondary | ICD-10-CM | POA: Diagnosis not present

## 2018-07-17 DIAGNOSIS — E1149 Type 2 diabetes mellitus with other diabetic neurological complication: Secondary | ICD-10-CM

## 2018-07-21 NOTE — Progress Notes (Signed)
Subjective: 42 year old male presents the office today for further evaluation of the right fourth toe wound.  He states that the wound is starting to dry out.  Denies any drainage or pus coming from the wound and denies any swelling or redness.  He does remain in surgical shoe.  He has no other concerns today. Denies any systemic complaints such as fevers, chills, nausea, vomiting. No acute changes since last appointment, and no other complaints at this time.   Objective: AAO x3, NAD DP/PT pulses palpable bilaterally, CRT less than 3 seconds Hammertoe contractures present to the right fourth toe more so than the other toes.  On the dorsal PIPJ is a hyperkeratotic lesion with central wound.  Appears to be almost complete healed this point there is very superficial pinpoint opening present today without any probing, undermining or tunneling.  No surrounding erythema, ascending cellulitis.  There is no fluctuation or crepitation or any malodor. The nails are elongated without any signs of infection present. No pain with calf compression, swelling, warmth, erythema  Assessment: Healing ulceration right fourth toe due to digital deformity  Plan: -All treatment options discussed with the patient including all alternatives, risks, complications.  -As a courtesy I debrided the toenails without any complications or bleeding. -Today I sharply debrided the wound utilizing #312 blade scalpel to remove all nonviable tissue down to healthy, granular tissue.  Continue mupirocin ointment dressing changes daily.  Continue with surgical shoe and offloading all times. -Monitor for any clinical signs or symptoms of infection and directed to call the office immediately should any occur or go to the ER. -Patient encouraged to call the office with any questions, concerns, change in symptoms.   Trula Slade DPM

## 2018-07-31 ENCOUNTER — Ambulatory Visit: Payer: Medicaid Other | Admitting: Podiatry

## 2018-08-12 ENCOUNTER — Ambulatory Visit (INDEPENDENT_AMBULATORY_CARE_PROVIDER_SITE_OTHER): Payer: Medicaid Other | Admitting: Podiatry

## 2018-08-12 DIAGNOSIS — L97511 Non-pressure chronic ulcer of other part of right foot limited to breakdown of skin: Secondary | ICD-10-CM

## 2018-08-12 DIAGNOSIS — E1149 Type 2 diabetes mellitus with other diabetic neurological complication: Secondary | ICD-10-CM

## 2018-08-12 DIAGNOSIS — I739 Peripheral vascular disease, unspecified: Secondary | ICD-10-CM

## 2018-08-12 DIAGNOSIS — L97519 Non-pressure chronic ulcer of other part of right foot with unspecified severity: Secondary | ICD-10-CM

## 2018-08-12 MED ORDER — GABAPENTIN 100 MG PO CAPS: 100.0000 mg | ORAL_CAPSULE | Freq: Every day | ORAL | 3 refills | Status: AC

## 2018-08-19 ENCOUNTER — Telehealth: Payer: Self-pay | Admitting: *Deleted

## 2018-08-19 DIAGNOSIS — E1149 Type 2 diabetes mellitus with other diabetic neurological complication: Secondary | ICD-10-CM

## 2018-08-19 DIAGNOSIS — I739 Peripheral vascular disease, unspecified: Secondary | ICD-10-CM

## 2018-08-19 DIAGNOSIS — L97519 Non-pressure chronic ulcer of other part of right foot with unspecified severity: Secondary | ICD-10-CM

## 2018-08-19 NOTE — Telephone Encounter (Signed)
-----   Message from Trula Slade, DPM sent at 08/19/2018  7:20 AM EDT ----- Can you please order arterial studies? Thanks.

## 2018-08-19 NOTE — Telephone Encounter (Signed)
[facility] AUTHORIZED [phone] ABI WITH TBI, SERVICE ORDER: [id number], EFFECTIVE DATE: 08/19/2018, END DATE: 09/18/2018. Faxed to [facility].

## 2018-08-19 NOTE — Progress Notes (Signed)
Subjective: 42 year old male presents the office today for further evaluation of the right fourth toe wound.  He states that overall he is doing well the wound is getting better.  He is also asking for refill of gabapentin.  Is having more numbness and tingling to his right side.  He has no other concerns today. Denies any systemic complaints such as fevers, chills, nausea, vomiting. No acute changes since last appointment, and no other complaints at this time.   Objective: AAO x3, NAD DP, PT pulses decreased on the right compared to contralateral extremity there is a slight temperature change on the right side compared to the left side. Hammertoe contractures present to the right fourth toe more so than the other toes.  On the dorsal PIPJ is a hyperkeratotic lesion with central wound.  Overall the wound appears to be improving and there is always a pinpoint lesion present.  There is no probing to bone, undermining or tunneling.  There is no edema to the toe and there is no erythema.  There is no ascending cellulitis.  There is no fluctuation or crepitation or any malodor. The nails are elongated without any signs of infection present. No pain with calf compression, swelling, warmth, erythema  Assessment: Healing ulceration right fourth toe due to digital deformity  Plan: -All treatment options discussed with the patient including all alternatives, risks, complications.  -As a courtesy I debrided the toenails without any complications or bleeding. -Today I sharply debrided the wound utilizing #312 blade scalpel to remove all nonviable tissue down to healthy, granular tissue.  Continue mupirocin ointment dressing changes daily.  Continue with surgical shoe and offloading all times. -Will order arterial studies given seems as a change in his vascular status on the right side.  I will order this bilaterally for comparison. -Refilled gabapentin. -Monitor for any clinical signs or symptoms of infection  and directed to call the office immediately should any occur or go to the ER. -Patient encouraged to call the office with any questions, concerns, change in symptoms.   Trula Slade DPM

## 2018-08-21 ENCOUNTER — Ambulatory Visit: Payer: Medicaid Other | Admitting: Podiatry

## 2018-08-25 ENCOUNTER — Ambulatory Visit (HOSPITAL_COMMUNITY)
Admission: RE | Admit: 2018-08-25 | Payer: Medicaid Other | Source: Ambulatory Visit | Attending: Podiatry | Admitting: Podiatry

## 2018-08-26 ENCOUNTER — Ambulatory Visit: Payer: Medicaid Other | Admitting: Podiatry

## 2018-08-26 ENCOUNTER — Other Ambulatory Visit: Payer: Self-pay | Admitting: Podiatry

## 2018-08-26 DIAGNOSIS — E1149 Type 2 diabetes mellitus with other diabetic neurological complication: Secondary | ICD-10-CM

## 2018-08-26 DIAGNOSIS — M79675 Pain in left toe(s): Secondary | ICD-10-CM

## 2018-08-26 DIAGNOSIS — M79674 Pain in right toe(s): Secondary | ICD-10-CM

## 2018-08-26 DIAGNOSIS — L97321 Non-pressure chronic ulcer of left ankle limited to breakdown of skin: Secondary | ICD-10-CM

## 2018-08-26 DIAGNOSIS — B351 Tinea unguium: Secondary | ICD-10-CM | POA: Diagnosis not present

## 2018-08-26 DIAGNOSIS — I739 Peripheral vascular disease, unspecified: Secondary | ICD-10-CM

## 2018-08-26 DIAGNOSIS — L97519 Non-pressure chronic ulcer of other part of right foot with unspecified severity: Secondary | ICD-10-CM

## 2018-08-27 NOTE — Progress Notes (Signed)
Subjective: 42 year old male presents the office today for further evaluation of the right fourth toe wound and for painful, elongated toenails that he would like to have trimmed. He states that the wound is doing well and no redness, drainage to the wound or nail sites. Denies any systemic complaints such as fevers, chills, nausea, vomiting. No acute changes since last appointment, and no other complaints at this time.   Objective: AAO x3, NAD DP, PT pulses decreased on the right compared to contralateral extremity there is a slight temperature change on the right side compared to the left side. Hammertoe contractures present to the right fourth toe more so than the other toes.  On the dorsal PIPJ is a hyperkeratotic lesion and upon debridement, there is no underlying ulcer and it appears to be healed.  Nails are hypertrophic, dystrophic, brittle, discolored, elongated 10. No surrounding redness or drainage. Tenderness nails 1-5 bilaterally. No open lesions or pre-ulcerative lesions are identified today. No pain with calf compression, swelling, warmth, erythema  Assessment: Healing ulceration right fourth toe due to digital deformity; symptomatic onychomycosis   Plan: -All treatment options discussed with the patient including all alternatives, risks, complications.  -I debrided the toenails without any complications or bleeding. -Today I sharply debrided the hyperkeratotic lesion utilizing #312 blade scalpel to reveal the underlying ulcer has healed. Offloading at all times. Can start to transition to a regular shoe but monitor very closely.  -Awaiting arterial studies.  -Gabapentin. -Monitor for any clinical signs or symptoms of infection and directed to call the office immediately should any occur or go to the ER. -Patient encouraged to call the office with any questions, concerns, change in symptoms.   Trula Slade DPM

## 2018-09-01 ENCOUNTER — Ambulatory Visit (HOSPITAL_COMMUNITY)
Admission: RE | Admit: 2018-09-01 | Discharge: 2018-09-01 | Disposition: A | Discharge status: Home / Self Care | Payer: Medicaid Other | Source: Ambulatory Visit | Attending: Internal Medicine | Admitting: Internal Medicine

## 2018-09-01 DIAGNOSIS — L97519 Non-pressure chronic ulcer of other part of right foot with unspecified severity: Secondary | ICD-10-CM | POA: Diagnosis present

## 2018-09-01 DIAGNOSIS — I739 Peripheral vascular disease, unspecified: Secondary | ICD-10-CM | POA: Diagnosis not present

## 2018-09-01 DIAGNOSIS — E1149 Type 2 diabetes mellitus with other diabetic neurological complication: Secondary | ICD-10-CM | POA: Diagnosis not present

## 2018-09-04 ENCOUNTER — Telehealth: Payer: Self-pay | Admitting: *Deleted

## 2018-09-04 NOTE — Telephone Encounter (Signed)
I informed pt of Dr. Leigh Aurora review of vascular test.

## 2018-09-04 NOTE — Telephone Encounter (Signed)
-----   Message from Trula Slade, DPM sent at 09/03/2018  5:53 PM EST ----- [name]- will you please let him know that the vascular test is normal. Thanks.

## 2018-10-28 ENCOUNTER — Ambulatory Visit: Payer: Medicaid Other | Admitting: Podiatry

## 2018-11-03 ENCOUNTER — Ambulatory Visit: Payer: Medicaid Other | Admitting: Podiatry

## 2018-11-03 DIAGNOSIS — B353 Tinea pedis: Secondary | ICD-10-CM

## 2018-11-03 DIAGNOSIS — E1142 Type 2 diabetes mellitus with diabetic polyneuropathy: Secondary | ICD-10-CM

## 2018-11-03 DIAGNOSIS — M79676 Pain in unspecified toe(s): Secondary | ICD-10-CM | POA: Diagnosis not present

## 2018-11-03 DIAGNOSIS — Z89431 Acquired absence of right foot: Secondary | ICD-10-CM

## 2018-11-03 DIAGNOSIS — B351 Tinea unguium: Secondary | ICD-10-CM

## 2018-11-03 MED ORDER — NYSTATIN-TRIAMCINOLONE 100000-0.1 UNIT/GM-% EX OINT: TOPICAL_OINTMENT | CUTANEOUS | 1 refills | Status: AC

## 2018-11-03 NOTE — Patient Instructions (Signed)
Diabetes Mellitus and Foot Care  Foot care is an important part of your health, especially when you have diabetes. Diabetes may cause you to have problems because of poor blood flow (circulation) to your feet and legs, which can cause your skin to:   Become thinner and drier.   Break more easily.   Heal more slowly.   Peel and crack.  You may also have nerve damage (neuropathy) in your legs and feet, causing decreased feeling in them. This means that you may not notice minor injuries to your feet that could lead to more serious problems. Noticing and addressing any potential problems early is the best way to prevent future foot problems.  How to care for your feet  Foot hygiene   Wash your feet daily with warm water and mild soap. Do not use hot water. Then, pat your feet and the areas between your toes until they are completely dry. Do not soak your feet as this can dry your skin.   Trim your toenails straight across. Do not dig under them or around the cuticle. File the edges of your nails with an emery board or nail file.   Apply a moisturizing lotion or petroleum jelly to the skin on your feet and to dry, brittle toenails. Use lotion that does not contain alcohol and is unscented. Do not apply lotion between your toes.  Shoes and socks   Wear clean socks or stockings every day. Make sure they are not too tight. Do not wear knee-high stockings since they may decrease blood flow to your legs.   Wear shoes that fit properly and have enough cushioning. Always look in your shoes before you put them on to be sure there are no objects inside.   To break in new shoes, wear them for just a few hours a day. This prevents injuries on your feet.  Wounds, scrapes, corns, and calluses   Check your feet daily for blisters, cuts, bruises, sores, and redness. If you cannot see the bottom of your feet, use a mirror or ask someone for help.   Do not cut corns or calluses or try to remove them with medicine.   If you  find a minor scrape, cut, or break in the skin on your feet, keep it and the skin around it clean and dry. You may clean these areas with mild soap and water. Do not clean the area with peroxide, alcohol, or iodine.   If you have a wound, scrape, corn, or callus on your foot, look at it several times a day to make sure it is healing and not infected. Check for:  ? Redness, swelling, or pain.  ? Fluid or blood.  ? Warmth.  ? Pus or a bad smell.  General instructions   Do not cross your legs. This may decrease blood flow to your feet.   Do not use heating pads or hot water bottles on your feet. They may burn your skin. If you have lost feeling in your feet or legs, you may not know this is happening until it is too late.   Protect your feet from hot and cold by wearing shoes, such as at the beach or on hot pavement.   Schedule a complete foot exam at least once a year (annually) or more often if you have foot problems. If you have foot problems, report any cuts, sores, or bruises to your health care provider immediately.  Contact a health care provider if:     You have a medical condition that increases your risk of infection and you have any cuts, sores, or bruises on your feet.   You have an injury that is not healing.   You have redness on your legs or feet.   You feel burning or tingling in your legs or feet.   You have pain or cramps in your legs and feet.   Your legs or feet are numb.   Your feet always feel cold.   You have pain around a toenail.  Get help right away if:   You have a wound, scrape, corn, or callus on your foot and:  ? You have pain, swelling, or redness that gets worse.  ? You have fluid or blood coming from the wound, scrape, corn, or callus.  ? Your wound, scrape, corn, or callus feels warm to the touch.  ? You have pus or a bad smell coming from the wound, scrape, corn, or callus.  ? You have a fever.  ? You have a red line going up your leg.  Summary   Check your feet every day  for cuts, sores, red spots, swelling, and blisters.   Moisturize feet and legs daily.   Wear shoes that fit properly and have enough cushioning.   If you have foot problems, report any cuts, sores, or bruises to your health care provider immediately.   Schedule a complete foot exam at least once a year (annually) or more often if you have foot problems.  This information is not intended to replace advice given to you by your health care provider. Make sure you discuss any questions you have with your health care provider.  Document Released: 10/12/2000 Document Revised: 11/27/2017 Document Reviewed: 11/16/2016  [employer]vier Interactive Patient Education  2019 [employer]

## 2018-11-04 NOTE — Progress Notes (Signed)
Subjective: Derrick Mosley presents today for follow up at-risk foot care.  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   He also has h/o break down of the left 4th digit and was seen by Dr. Jacqualyn Posey. This digit has now healed.  Last HgA1c was 11.7% (June 2019 with Dr. Cruzita Lederer)  [facility], [facility] is his PCP.    Current Outpatient Medications:  .  ACCU-CHEK FASTCLIX LANCETS MISC, Use 3 times a day, Disp: 300 each, Rfl: 3 .  amLODipine (NORVASC) 2.5 MG tablet, Take 1 tablet (2.5 mg total) by mouth daily., Disp: 30 tablet, Rfl: 3 .  amLODipine (NORVASC) 5 MG tablet, Take 5 mg by mouth daily., Disp: , Rfl: 3 .  aspirin EC 81 MG EC tablet, Take 1 tablet (81 mg total) by mouth daily., Disp: 30 tablet, Rfl: 3 .  ASPIRIN PO, aspirin, Disp: , Rfl:  .  atorvastatin (LIPITOR) 80 MG tablet, Take 1 tablet (80 mg total) by mouth daily at 6 PM., Disp: 30 tablet, Rfl: 3 .  gabapentin (NEURONTIN) 100 MG capsule, Take 1 capsule (100 mg total) by mouth at bedtime., Disp: 90 capsule, Rfl: 3 .  GABAPENTIN PO, gabapentin, Disp: , Rfl:  .  glucose blood (ACCU-CHEK GUIDE) test strip, Use 3 times a day, Disp: 300 each, Rfl: 3 .  HYDROcodone-acetaminophen (NORCO) 10-325 MG tablet, Take 1 tablet by mouth 2 (two) times daily as needed., Disp: , Rfl: 0 .  insulin aspart (NOVOLOG FLEXPEN) 100 UNIT/ML FlexPen, Inject 5-7 Units into the skin 3 (three) times daily before meals., Disp: 15 mL, Rfl: 5 .  insulin aspart (NOVOLOG) cartridge, insulin aspart U-100, Disp: , Rfl:  .  Insulin Glargine (LANTUS SOLOSTAR) 100 UNIT/ML Solostar Pen, Inject 25 Units into the skin daily at 10 pm., Disp: 5 pen, Rfl: 5 .  Insulin Pen Needle (B-D ULTRAFINE III SHORT PEN) 31G X 8 MM MISC, 1 each by Does not apply route as directed., Disp: 100 each, Rfl: 3 .  Insulin Syringe-Needle U-100 (INSULIN SYRINGE 1CC/30GX1/2") 30G X 1/2" 1 ML MISC, 1 Device by Does not apply route 2 (two) times daily before a meal., Disp: 100  each, Rfl: 0 .  methocarbamol (ROBAXIN) 500 MG tablet, methocarbamol 500 mg tablet  Take 1 tablet 3 times a day by oral route as needed., Disp: , Rfl:  .  metoprolol tartrate (LOPRESSOR) 25 MG tablet, Take 1 tablet (25 mg total) by mouth 2 (two) times daily., Disp: 60 tablet, Rfl: 3 .  mupirocin ointment (BACTROBAN) 2 %, Apply 1 application topically 2 (two) times daily., Disp: 30 g, Rfl: 2 .  nicotine (NICODERM CQ) 14 mg/24hr patch, Nicoderm CQ, Disp: , Rfl:  .  nitroGLYCERIN (NITROSTAT) 0.4 MG SL tablet, Place 1 tablet (0.4 mg total) under the tongue every 5 (five) minutes as needed for chest pain., Disp: 25 tablet, Rfl: 12 .  Semaglutide (OZEMPIC) 0.25 or 0.5 MG/DOSE SOPN, Inject 0.25 mg into the skin once a week., Disp: 2 pen, Rfl: 5 .  ticagrelor (BRILINTA) 90 MG TABS tablet, Take 1 tablet (90 mg total) by mouth 2 (two) times daily., Disp: 60 tablet, Rfl: 11 .  nystatin (MYCOSTATIN/NYSTOP) powder, Apply topically 2 (two) times daily., Disp: 15 g, Rfl: 2 .  nystatin-triamcinolone ointment (MYCOLOG), Apply between 4th and 5th toe left foot once daily, Disp: 30 g, Rfl: 1  No Known Allergies  Objective:  Vascular Examination: Capillary refill time immediate x 10 digits  Dorsalis pedis  palpable left; diminished on the right LE Posterior tibial pulse palpable left; diminished on the right LE  Digital hair sparse x 9 digits  Skin temperature gradient WNL left; warm to cool RLE  No signs of ischemia noted b/l  No gangrene noted b/l  Dermatological Examination: Skin with normal turgor, texture and tone b/l  Toenails 1-4 right, 1-5 left discolored, thick, dystrophic with subungual debris and pain with palpation to nailbeds due to thickness of nails.  Interdigital maceration noted left 4th webspace  Amputation site well healed with no skin irritation noted.  Musculoskeletal: Muscle strength 5/5 to all LE muscle groups  Amputation: 5th ray resection right foot  Hammertoes: right  1-4, left 2-5  No pain, crepitus or joint limitation noted with ROM b/l.   Neurological: Sensation diminished with 10 gram monofilament.  Assessment: 1. Onychomycosis toenails 1-4 right, 1-5 left 2. 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 NIDDM with neuropathy  Plan: 1. Toenails 1-4 right, 1-5 left  were debrided in length and girth without iatrogenic bleeding. 2. For tinea pedis, start Nystatin Powder to webspaces qd to 4th webspce left foot 3. Prescription given to patient for diabetic shoes on today for [facility] for 1 pair extra depth shoes with 1 filler for right foot and 3 total contact insoles for left foot. Supporting diagnoses for diabetic shoes: NIDDM with neuropathy, s/p 5th ray amputation right foot, hammertoes 1-4 right foot, 2-5 left foot 4. Patient to continue soft, supportive shoe gear 5. Patient to report any pedal injuries to medical professional immediately. 6. Followup 3 months. Patient/POA to call should there be a concern in the interim.

## 2018-11-12 ENCOUNTER — Telehealth: Payer: Self-pay | Admitting: Podiatry

## 2018-11-12 ENCOUNTER — Other Ambulatory Visit: Payer: Self-pay | Admitting: Podiatry

## 2018-11-12 MED ORDER — NYSTATIN 100000 UNIT/GM EX POWD: Freq: Two times a day (BID) | CUTANEOUS | 2 refills | Status: DC

## 2018-11-12 MED ORDER — NYSTATIN 100000 UNIT/GM EX POWD
Freq: Two times a day (BID) | CUTANEOUS | 2 refills | Status: DC
Start: 2018-11-12 — End: 2018-11-12

## 2018-11-12 NOTE — Telephone Encounter (Signed)
Pt called stating that he went to pick up his medication from the Pharmacy and they stated that they needed to speak with his doctor prior to filling the medication. Pt would like to know if we can contact the pharmacy to get it cleared up.  Bryan in [place]

## 2018-11-12 NOTE — Telephone Encounter (Signed)
Pt was return a phone call. Please give him a call back

## 2018-11-12 NOTE — Telephone Encounter (Signed)
[facility] - [facility] states the [facility] needs a PA. I asked Crystal to send PA paperwork to be given to our pre-cert agent.

## 2018-11-18 ENCOUNTER — Encounter: Payer: Self-pay | Admitting: Podiatry

## 2019-02-02 ENCOUNTER — Other Ambulatory Visit: Payer: Self-pay

## 2019-02-02 ENCOUNTER — Encounter: Payer: Self-pay | Admitting: Podiatry

## 2019-02-02 ENCOUNTER — Ambulatory Visit: Payer: Medicaid Other | Admitting: Podiatry

## 2019-02-02 VITALS — Temp 97.5°F

## 2019-02-02 DIAGNOSIS — Z89431 Acquired absence of right foot: Secondary | ICD-10-CM

## 2019-02-02 DIAGNOSIS — M79676 Pain in unspecified toe(s): Secondary | ICD-10-CM

## 2019-02-02 DIAGNOSIS — L84 Corns and callosities: Secondary | ICD-10-CM | POA: Diagnosis not present

## 2019-02-02 DIAGNOSIS — E1142 Type 2 diabetes mellitus with diabetic polyneuropathy: Secondary | ICD-10-CM | POA: Diagnosis not present

## 2019-02-02 DIAGNOSIS — B351 Tinea unguium: Secondary | ICD-10-CM | POA: Diagnosis not present

## 2019-02-04 NOTE — Progress Notes (Signed)
Subjective: Derrick Mosley presents for follow up at-risk foot care with h/o diabetes, diabetic neuropathy. He is s/p amputation right 5th ray  He states he was never able to get prescription filled for drying agent to apply between his toes.   [facility], [facility] is his PCP.   Current Outpatient Medications:  .  ACCU-CHEK FASTCLIX LANCETS MISC, Use 3 times a day, Disp: 300 each, Rfl: 3 .  amLODipine (NORVASC) 2.5 MG tablet, Take 1 tablet (2.5 mg total) by mouth daily., Disp: 30 tablet, Rfl: 3 .  amLODipine (NORVASC) 5 MG tablet, Take 5 mg by mouth daily., Disp: , Rfl: 3 .  aspirin EC 81 MG EC tablet, Take 1 tablet (81 mg total) by mouth daily., Disp: 30 tablet, Rfl: 3 .  ASPIRIN PO, aspirin, Disp: , Rfl:  .  atorvastatin (LIPITOR) 80 MG tablet, Take 1 tablet (80 mg total) by mouth daily at 6 PM., Disp: 30 tablet, Rfl: 3 .  furosemide (LASIX) 20 MG tablet, TAKE 1 TABLET BY MOUTH ONCE DAILY IN THE MORNING, Disp: , Rfl:  .  gabapentin (NEURONTIN) 100 MG capsule, Take 1 capsule (100 mg total) by mouth at bedtime., Disp: 90 capsule, Rfl: 3 .  GABAPENTIN PO, gabapentin, Disp: , Rfl:  .  glucose blood (ACCU-CHEK GUIDE) test strip, Use 3 times a day, Disp: 300 each, Rfl: 3 .  HYDROcodone-acetaminophen (NORCO) 10-325 MG tablet, Take 1 tablet by mouth 2 (two) times daily as needed., Disp: , Rfl: 0 .  insulin aspart (NOVOLOG FLEXPEN) 100 UNIT/ML FlexPen, Inject 5-7 Units into the skin 3 (three) times daily before meals., Disp: 15 mL, Rfl: 5 .  insulin aspart (NOVOLOG) cartridge, insulin aspart U-100, Disp: , Rfl:  .  Insulin Glargine (LANTUS SOLOSTAR) 100 UNIT/ML Solostar Pen, Inject 25 Units into the skin daily at 10 pm., Disp: 5 pen, Rfl: 5 .  Insulin Pen Needle (B-D ULTRAFINE III SHORT PEN) 31G X 8 MM MISC, 1 each by Does not apply route as directed., Disp: 100 each, Rfl: 3 .  Insulin Syringe-Needle U-100 (INSULIN SYRINGE 1CC/30GX1/2") 30G X 1/2" 1 ML MISC, 1 Device by Does not apply route  2 (two) times daily before a meal., Disp: 100 each, Rfl: 0 .  methocarbamol (ROBAXIN) 500 MG tablet, methocarbamol 500 mg tablet  Take 1 tablet 3 times a day by oral route as needed., Disp: , Rfl:  .  metoprolol tartrate (LOPRESSOR) 25 MG tablet, Take 1 tablet (25 mg total) by mouth 2 (two) times daily., Disp: 60 tablet, Rfl: 3 .  mupirocin ointment (BACTROBAN) 2 %, Apply 1 application topically 2 (two) times daily., Disp: 30 g, Rfl: 2 .  nicotine (NICODERM CQ) 14 mg/24hr patch, Nicoderm CQ, Disp: , Rfl:  .  nitroGLYCERIN (NITROSTAT) 0.4 MG SL tablet, Place 1 tablet (0.4 mg total) under the tongue every 5 (five) minutes as needed for chest pain., Disp: 25 tablet, Rfl: 12 .  nystatin (MYCOSTATIN/NYSTOP) powder, Apply topically 2 (two) times daily., Disp: 15 g, Rfl: 2 .  nystatin-triamcinolone ointment (MYCOLOG), Apply between 4th and 5th toe left foot once daily, Disp: 30 g, Rfl: 1 .  Semaglutide (OZEMPIC) 0.25 or 0.5 MG/DOSE SOPN, Inject 0.25 mg into the skin once a week., Disp: 2 pen, Rfl: 5 .  ticagrelor (BRILINTA) 90 MG TABS tablet, Take 1 tablet (90 mg total) by mouth 2 (two) times daily., Disp: 60 tablet, Rfl: 11  No Known Allergies  Vascular Examination: Capillary refill time immediate x 9 digits.  Dorsalis pedis pulses palpable left foot; diminished right foot.  Posterior tibial pulses palpable left foot; diminished right foot.  Digital hair sparse x 9 digits.  Skin temperature gradient WNL b/l.  Dermatological Examination: Skin with normal turgor, texture and tone b/l.  Toenails 1-4 b/l and left 5th digit discolored, thick, dystrophic with subungual debris and pain with palpation to nailbeds due to thickness of nails.  Mild interdigital maceration noted 3rd webspace b/l and 4th webspace left foot. No breaks in skin. No signs of deep space infection.  Hyperkeratotic lesion dorsal PIPJ right 4th digit. Evidence of postinflammatory hyperpigmentation. No erythema, no edema, no  drainage, no flocculence noted. No underlying wound noted.  Amputation scar lateral aspect right foot well healed.  Musculoskeletal: Muscle strength 5/5 to all LE muscle groups  Neurological: Sensation diminished with 10 gram monofilament.   Assessment: 1. Painful onychomycosis toenails 1-5 b/l 2. Tinea pedis b/l 3. Corn right 4th digit 4. NIDDM with Diabetic neuropathy 5. 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 Continue diabetic foot care principles.  2. Toenails 1-5 b/l were debrided in length and girth without iatrogenic bleeding. 3.  Corn(s) pared right 4th toe utilizing sterile scalpel blade without incident.  4. For tinea pedis, will have to research Medicaid formulary. 5. Patient to continue soft, supportive shoe gear. Currently, we are unable to find vendors to supply diabetic shoes for Medicaid patients. He ws instructed to avoid leather shoe gear which rubs 4th digit.  6. Patient to report any pedal injuries to medical professional  7. Follow up 3 months.  8. Patient/POA to call should there be a concern in the interim.

## 2019-02-26 ENCOUNTER — Ambulatory Visit (INDEPENDENT_AMBULATORY_CARE_PROVIDER_SITE_OTHER): Payer: Medicaid Other | Admitting: Internal Medicine

## 2019-02-26 ENCOUNTER — Encounter: Payer: Self-pay | Admitting: Internal Medicine

## 2019-02-26 ENCOUNTER — Other Ambulatory Visit: Payer: Self-pay

## 2019-02-26 DIAGNOSIS — E1165 Type 2 diabetes mellitus with hyperglycemia: Secondary | ICD-10-CM

## 2019-02-26 DIAGNOSIS — E1159 Type 2 diabetes mellitus with other circulatory complications: Secondary | ICD-10-CM

## 2019-02-26 DIAGNOSIS — E782 Mixed hyperlipidemia: Secondary | ICD-10-CM

## 2019-02-26 DIAGNOSIS — E669 Obesity, unspecified: Secondary | ICD-10-CM | POA: Insufficient documentation

## 2019-02-26 MED ORDER — INSULIN ASPART 100 UNIT/ML FLEXPEN: 12.0000 [IU] | PEN_INJECTOR | Freq: Three times a day (TID) | SUBCUTANEOUS | 5 refills | Status: DC

## 2019-02-26 MED ORDER — SEMAGLUTIDE(0.25 OR 0.5MG/DOS) 2 MG/1.5ML ~~LOC~~ SOPN: 0.2500 mg | PEN_INJECTOR | SUBCUTANEOUS | 5 refills | Status: DC

## 2019-02-26 MED ORDER — INSULIN GLARGINE 100 UNIT/ML SOLOSTAR PEN: 25.0000 [IU] | PEN_INJECTOR | Freq: Two times a day (BID) | SUBCUTANEOUS | 5 refills | Status: DC
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
## 2019-02-26 NOTE — Patient Instructions (Addendum)
Please increase: - Lantus 25 units 2x a day - Novolog 12-16 units before meals  Please start Ozempic 0.25 mg weekly in a.m. (for example on Sunday morning) x 4 weeks, then increase to 0.5 mg weekly in a.m. if no nausea or hypoglycemia.  Please return in 3 months with your sugar log.

## 2019-02-27 ENCOUNTER — Telehealth: Payer: Self-pay

## 2019-02-27 NOTE — Telephone Encounter (Signed)
Yes, let's try

## 2019-02-27 NOTE — Telephone Encounter (Signed)
Ozempic is not covered by patient's insurance, would you like to proceed with a PA?

## 2019-03-03 NOTE — Telephone Encounter (Signed)
PA has been submitted.

## 2019-03-15 ENCOUNTER — Observation Stay (HOSPITAL_COMMUNITY)
Admission: EM | Admit: 2019-03-15 | Discharge: 2019-03-16 | Disposition: A | Discharge status: Home / Self Care | Payer: Medicare Other | Attending: Internal Medicine | Admitting: Internal Medicine

## 2019-03-15 ENCOUNTER — Encounter (HOSPITAL_COMMUNITY): Payer: Self-pay

## 2019-03-15 ENCOUNTER — Other Ambulatory Visit: Payer: Self-pay

## 2019-03-15 ENCOUNTER — Emergency Department (HOSPITAL_COMMUNITY): Payer: Medicare Other

## 2019-03-15 DIAGNOSIS — I10 Essential (primary) hypertension: Secondary | ICD-10-CM | POA: Diagnosis present

## 2019-03-15 DIAGNOSIS — Z79899 Other long term (current) drug therapy: Secondary | ICD-10-CM | POA: Diagnosis not present

## 2019-03-15 DIAGNOSIS — Z7982 Long term (current) use of aspirin: Secondary | ICD-10-CM | POA: Diagnosis not present

## 2019-03-15 DIAGNOSIS — D649 Anemia, unspecified: Secondary | ICD-10-CM | POA: Diagnosis present

## 2019-03-15 DIAGNOSIS — R0789 Other chest pain: Secondary | ICD-10-CM | POA: Diagnosis not present

## 2019-03-15 DIAGNOSIS — R0600 Dyspnea, unspecified: Secondary | ICD-10-CM | POA: Diagnosis not present

## 2019-03-15 DIAGNOSIS — E1165 Type 2 diabetes mellitus with hyperglycemia: Secondary | ICD-10-CM | POA: Diagnosis present

## 2019-03-15 DIAGNOSIS — F17210 Nicotine dependence, cigarettes, uncomplicated: Secondary | ICD-10-CM | POA: Diagnosis not present

## 2019-03-15 DIAGNOSIS — I2510 Atherosclerotic heart disease of native coronary artery without angina pectoris: Secondary | ICD-10-CM | POA: Insufficient documentation

## 2019-03-15 DIAGNOSIS — I129 Hypertensive chronic kidney disease with stage 1 through stage 4 chronic kidney disease, or unspecified chronic kidney disease: Secondary | ICD-10-CM | POA: Insufficient documentation

## 2019-03-15 DIAGNOSIS — E875 Hyperkalemia: Secondary | ICD-10-CM | POA: Diagnosis present

## 2019-03-15 DIAGNOSIS — I249 Acute ischemic heart disease, unspecified: Secondary | ICD-10-CM | POA: Diagnosis present

## 2019-03-15 DIAGNOSIS — E1159 Type 2 diabetes mellitus with other circulatory complications: Secondary | ICD-10-CM | POA: Diagnosis present

## 2019-03-15 DIAGNOSIS — I252 Old myocardial infarction: Secondary | ICD-10-CM | POA: Insufficient documentation

## 2019-03-15 DIAGNOSIS — E1122 Type 2 diabetes mellitus with diabetic chronic kidney disease: Secondary | ICD-10-CM | POA: Diagnosis not present

## 2019-03-15 DIAGNOSIS — R202 Paresthesia of skin: Secondary | ICD-10-CM | POA: Diagnosis present

## 2019-03-15 DIAGNOSIS — N183 Chronic kidney disease, stage 3 unspecified: Secondary | ICD-10-CM | POA: Diagnosis present

## 2019-03-15 DIAGNOSIS — Z20828 Contact with and (suspected) exposure to other viral communicable diseases: Secondary | ICD-10-CM | POA: Insufficient documentation

## 2019-03-15 DIAGNOSIS — F17200 Nicotine dependence, unspecified, uncomplicated: Secondary | ICD-10-CM | POA: Diagnosis present

## 2019-03-15 DIAGNOSIS — E785 Hyperlipidemia, unspecified: Secondary | ICD-10-CM | POA: Diagnosis present

## 2019-03-15 DIAGNOSIS — R079 Chest pain, unspecified: Secondary | ICD-10-CM | POA: Diagnosis not present

## 2019-03-15 DIAGNOSIS — Z794 Long term (current) use of insulin: Secondary | ICD-10-CM | POA: Diagnosis not present

## 2019-03-15 DIAGNOSIS — E871 Hypo-osmolality and hyponatremia: Secondary | ICD-10-CM | POA: Diagnosis not present

## 2019-03-15 HISTORY — DX: Atherosclerotic heart disease of native coronary artery without angina pectoris: I25.10

## 2019-03-15 HISTORY — DX: Type 2 diabetes mellitus without complications: E11.9

## 2019-03-15 HISTORY — DX: Essential (primary) hypertension: I10

## 2019-03-15 HISTORY — DX: ST elevation (STEMI) myocardial infarction involving other coronary artery of inferior wall: I21.19

## 2019-03-15 LAB — BASIC METABOLIC PANEL
Anion gap: 9 (ref 5–15)
BUN: 35 mg/dL — ABNORMAL HIGH (ref 6–20)
CO2: 21 mmol/L — ABNORMAL LOW (ref 22–32)
Calcium: 8.3 mg/dL — ABNORMAL LOW (ref 8.9–10.3)
Chloride: 102 mmol/L (ref 98–111)
Creatinine, Ser: 2.2 mg/dL — ABNORMAL HIGH (ref 0.61–1.24)
GFR calc Af Amer: 42 mL/min — ABNORMAL LOW (ref >60)
GFR calc non Af Amer: 36 mL/min — ABNORMAL LOW (ref >60)
Glucose, Bld: 273 mg/dL — ABNORMAL HIGH (ref 70–99)
Potassium: 5.5 mmol/L — ABNORMAL HIGH (ref 3.5–5.1)
Sodium: 132 mmol/L — ABNORMAL LOW (ref 135–145)

## 2019-03-15 LAB — CBC
HCT: 33 % — ABNORMAL LOW (ref 39.0–52.0)
Hemoglobin: 11.3 g/dL — ABNORMAL LOW (ref 13.0–17.0)
MCH: 30.5 pg (ref 26.0–34.0)
MCHC: 34.2 g/dL (ref 30.0–36.0)
MCV: 88.9 fL (ref 80.0–100.0)
Platelets: 279 10*3/uL (ref 150–400)
RBC: 3.71 MIL/uL — ABNORMAL LOW (ref 4.22–5.81)
RDW: 12.2 % (ref 11.5–15.5)
WBC: 7.8 10*3/uL (ref 4.0–10.5)
nRBC: 0 % (ref 0.0–0.2)

## 2019-03-15 LAB — TROPONIN I
Troponin I: <0.03 ng/mL (ref <0.03)
Troponin I: <0.03 ng/mL (ref <0.03)

## 2019-03-15 LAB — CBG MONITORING, ED: Glucose-Capillary: 259 mg/dL — ABNORMAL HIGH (ref 70–99)

## 2019-03-15 LAB — SARS CORONAVIRUS 2 BY RT PCR (HOSPITAL ORDER, PERFORMED IN ~~LOC~~ HOSPITAL LAB): SARS Coronavirus 2: NEGATIVE

## 2019-03-15 LAB — MAGNESIUM: Magnesium: 2.3 mg/dL (ref 1.7–2.4)

## 2019-03-15 LAB — GLUCOSE, CAPILLARY: Glucose-Capillary: 187 mg/dL — ABNORMAL HIGH (ref 70–99)

## 2019-03-15 LAB — BRAIN NATRIURETIC PEPTIDE: B Natriuretic Peptide: 107 pg/mL — ABNORMAL HIGH (ref 0.0–100.0)

## 2019-03-15 MED ORDER — METHOCARBAMOL 500 MG PO TABS: 500.0000 mg | ORAL_TABLET | Freq: Three times a day (TID) | ORAL | Status: DC | PRN

## 2019-03-15 MED ORDER — TICAGRELOR 90 MG PO TABS
90.0000 mg | ORAL_TABLET | Freq: Two times a day (BID) | ORAL | Status: DC
  Administered 2019-03-15 – 2019-03-16 (×2): 90 mg via ORAL
  Filled 2019-03-15 (×2): qty 1

## 2019-03-15 MED ORDER — ALPRAZOLAM 0.25 MG PO TABS: 0.2500 mg | ORAL_TABLET | Freq: Two times a day (BID) | ORAL | Status: DC | PRN

## 2019-03-15 MED ORDER — INSULIN GLARGINE 100 UNIT/ML ~~LOC~~ SOLN
25.0000 [IU] | Freq: Two times a day (BID) | SUBCUTANEOUS | Status: DC
  Administered 2019-03-15 – 2019-03-16 (×2): 25 [IU] via SUBCUTANEOUS
  Filled 2019-03-15 (×4): qty 0.25

## 2019-03-15 MED ORDER — FUROSEMIDE 20 MG PO TABS
20.0000 mg | ORAL_TABLET | Freq: Every day | ORAL | Status: DC
  Administered 2019-03-16: 20 mg via ORAL
  Filled 2019-03-15: qty 1

## 2019-03-15 MED ORDER — ASPIRIN 81 MG PO CHEW
324.0000 mg | CHEWABLE_TABLET | Freq: Once | ORAL | Status: AC
  Administered 2019-03-15: 324 mg via ORAL
  Filled 2019-03-15: qty 4

## 2019-03-15 MED ORDER — SODIUM CHLORIDE 0.9 % IV SOLN
INTRAVENOUS | Status: AC
  Administered 2019-03-15: 23:00:00 via INTRAVENOUS

## 2019-03-15 MED ORDER — METOPROLOL TARTRATE 25 MG PO TABS
25.0000 mg | ORAL_TABLET | Freq: Two times a day (BID) | ORAL | Status: DC
  Administered 2019-03-15 – 2019-03-16 (×2): 25 mg via ORAL
  Filled 2019-03-15 (×2): qty 1

## 2019-03-15 MED ORDER — AMLODIPINE BESYLATE 5 MG PO TABS: 2.5000 mg | ORAL_TABLET | Freq: Every day | ORAL | Status: DC

## 2019-03-15 MED ORDER — NICOTINE 14 MG/24HR TD PT24
14.0000 mg | MEDICATED_PATCH | Freq: Every day | TRANSDERMAL | Status: DC
  Administered 2019-03-16: 09:00:00 14 mg via TRANSDERMAL
  Filled 2019-03-15: qty 1

## 2019-03-15 MED ORDER — ACETAMINOPHEN 325 MG PO TABS: 650.0000 mg | ORAL_TABLET | ORAL | Status: DC | PRN

## 2019-03-15 MED ORDER — NITROGLYCERIN 0.4 MG SL SUBL
0.4000 mg | SUBLINGUAL_TABLET | SUBLINGUAL | Status: DC | PRN
  Filled 2019-03-15: qty 1

## 2019-03-15 MED ORDER — ATORVASTATIN CALCIUM 40 MG PO TABS: 80.0000 mg | ORAL_TABLET | Freq: Every day | ORAL | Status: DC

## 2019-03-15 MED ORDER — ASPIRIN EC 81 MG PO TBEC
81.0000 mg | DELAYED_RELEASE_TABLET | Freq: Every day | ORAL | Status: DC
  Administered 2019-03-16: 81 mg via ORAL
  Filled 2019-03-15: qty 1

## 2019-03-15 MED ORDER — ENOXAPARIN SODIUM 40 MG/0.4ML ~~LOC~~ SOLN: 40.0000 mg | SUBCUTANEOUS | Status: DC

## 2019-03-15 MED ORDER — SODIUM CHLORIDE 0.9% FLUSH
3.0000 mL | Freq: Once | INTRAVENOUS | Status: AC
  Administered 2019-03-15: 3 mL via INTRAVENOUS

## 2019-03-15 MED ORDER — ENOXAPARIN SODIUM 60 MG/0.6ML ~~LOC~~ SOLN
50.0000 mg | SUBCUTANEOUS | Status: DC
  Administered 2019-03-15: 50 mg via SUBCUTANEOUS
  Filled 2019-03-15: qty 0.6

## 2019-03-15 MED ORDER — AMLODIPINE BESYLATE 5 MG PO TABS
5.0000 mg | ORAL_TABLET | Freq: Every day | ORAL | Status: DC
  Administered 2019-03-16: 5 mg via ORAL
  Filled 2019-03-15: qty 1

## 2019-03-15 MED ORDER — PROCHLORPERAZINE EDISYLATE 10 MG/2ML IJ SOLN: 10.0000 mg | Freq: Four times a day (QID) | INTRAMUSCULAR | Status: DC | PRN

## 2019-03-15 MED ORDER — GABAPENTIN 100 MG PO CAPS
100.0000 mg | ORAL_CAPSULE | Freq: Every day | ORAL | Status: DC
  Administered 2019-03-15: 23:00:00 100 mg via ORAL
  Filled 2019-03-15: qty 1

## 2019-03-15 MED ORDER — HYDROCODONE-ACETAMINOPHEN 10-325 MG PO TABS
1.0000 | ORAL_TABLET | Freq: Two times a day (BID) | ORAL | Status: DC | PRN
  Administered 2019-03-15: 1 via ORAL
  Filled 2019-03-15: qty 1
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
## 2019-03-15 NOTE — ED Triage Notes (Addendum)
Pt reports left side cp that began last night while resting . Reports nausea this morning. And continued with intermittent CP. Reports vomiting and sweaty during episode of pain. Pt reports MI last year march 2019
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
## 2019-03-16 ENCOUNTER — Other Ambulatory Visit (HOSPITAL_COMMUNITY): Payer: Medicaid Other

## 2019-03-16 ENCOUNTER — Observation Stay (HOSPITAL_COMMUNITY): Payer: Medicare Other

## 2019-03-16 ENCOUNTER — Encounter (HOSPITAL_COMMUNITY): Payer: Self-pay | Admitting: Cardiology

## 2019-03-16 DIAGNOSIS — E1159 Type 2 diabetes mellitus with other circulatory complications: Secondary | ICD-10-CM

## 2019-03-16 DIAGNOSIS — I252 Old myocardial infarction: Secondary | ICD-10-CM | POA: Diagnosis not present

## 2019-03-16 DIAGNOSIS — E875 Hyperkalemia: Secondary | ICD-10-CM | POA: Diagnosis not present

## 2019-03-16 DIAGNOSIS — Z7982 Long term (current) use of aspirin: Secondary | ICD-10-CM | POA: Diagnosis not present

## 2019-03-16 DIAGNOSIS — R0600 Dyspnea, unspecified: Secondary | ICD-10-CM | POA: Diagnosis not present

## 2019-03-16 DIAGNOSIS — E1165 Type 2 diabetes mellitus with hyperglycemia: Secondary | ICD-10-CM | POA: Diagnosis not present

## 2019-03-16 DIAGNOSIS — I10 Essential (primary) hypertension: Secondary | ICD-10-CM | POA: Diagnosis not present

## 2019-03-16 DIAGNOSIS — E871 Hypo-osmolality and hyponatremia: Secondary | ICD-10-CM

## 2019-03-16 DIAGNOSIS — E782 Mixed hyperlipidemia: Secondary | ICD-10-CM | POA: Diagnosis not present

## 2019-03-16 DIAGNOSIS — F17200 Nicotine dependence, unspecified, uncomplicated: Secondary | ICD-10-CM

## 2019-03-16 DIAGNOSIS — I2510 Atherosclerotic heart disease of native coronary artery without angina pectoris: Secondary | ICD-10-CM | POA: Diagnosis not present

## 2019-03-16 DIAGNOSIS — Z20828 Contact with and (suspected) exposure to other viral communicable diseases: Secondary | ICD-10-CM | POA: Diagnosis not present

## 2019-03-16 DIAGNOSIS — N183 Chronic kidney disease, stage 3 (moderate): Secondary | ICD-10-CM | POA: Diagnosis not present

## 2019-03-16 DIAGNOSIS — I249 Acute ischemic heart disease, unspecified: Secondary | ICD-10-CM | POA: Diagnosis not present

## 2019-03-16 DIAGNOSIS — E1122 Type 2 diabetes mellitus with diabetic chronic kidney disease: Secondary | ICD-10-CM | POA: Diagnosis not present

## 2019-03-16 DIAGNOSIS — I25119 Atherosclerotic heart disease of native coronary artery with unspecified angina pectoris: Secondary | ICD-10-CM | POA: Diagnosis not present

## 2019-03-16 DIAGNOSIS — Z794 Long term (current) use of insulin: Secondary | ICD-10-CM | POA: Diagnosis not present

## 2019-03-16 DIAGNOSIS — F17210 Nicotine dependence, cigarettes, uncomplicated: Secondary | ICD-10-CM | POA: Diagnosis not present

## 2019-03-16 DIAGNOSIS — R079 Chest pain, unspecified: Secondary | ICD-10-CM | POA: Diagnosis not present

## 2019-03-16 DIAGNOSIS — I129 Hypertensive chronic kidney disease with stage 1 through stage 4 chronic kidney disease, or unspecified chronic kidney disease: Secondary | ICD-10-CM | POA: Diagnosis not present

## 2019-03-16 LAB — BASIC METABOLIC PANEL
Anion gap: 6 (ref 5–15)
BUN: 30 mg/dL — ABNORMAL HIGH (ref 6–20)
CO2: 22 mmol/L (ref 22–32)
Calcium: 8.1 mg/dL — ABNORMAL LOW (ref 8.9–10.3)
Chloride: 108 mmol/L (ref 98–111)
Creatinine, Ser: 1.91 mg/dL — ABNORMAL HIGH (ref 0.61–1.24)
GFR calc Af Amer: 49 mL/min — ABNORMAL LOW (ref >60)
GFR calc non Af Amer: 43 mL/min — ABNORMAL LOW (ref >60)
Glucose, Bld: 186 mg/dL — ABNORMAL HIGH (ref 70–99)
Potassium: 4.4 mmol/L (ref 3.5–5.1)
Sodium: 136 mmol/L (ref 135–145)

## 2019-03-16 LAB — CBC
HCT: 30.2 % — ABNORMAL LOW (ref 39.0–52.0)
Hemoglobin: 10.4 g/dL — ABNORMAL LOW (ref 13.0–17.0)
MCH: 30.8 pg (ref 26.0–34.0)
MCHC: 34.4 g/dL (ref 30.0–36.0)
MCV: 89.3 fL (ref 80.0–100.0)
Platelets: 242 10*3/uL (ref 150–400)
RBC: 3.38 MIL/uL — ABNORMAL LOW (ref 4.22–5.81)
RDW: 12.2 % (ref 11.5–15.5)
WBC: 6.8 10*3/uL (ref 4.0–10.5)
nRBC: 0 % (ref 0.0–0.2)

## 2019-03-16 LAB — GLUCOSE, CAPILLARY
Glucose-Capillary: 145 mg/dL — ABNORMAL HIGH (ref 70–99)
Glucose-Capillary: 138 mg/dL — ABNORMAL HIGH (ref 70–99)

## 2019-03-16 LAB — TROPONIN I: Troponin I: <0.03 ng/mL (ref <0.03)

## 2019-03-16 MED ORDER — INSULIN ASPART 100 UNIT/ML ~~LOC~~ SOLN: 0.0000 [IU] | Freq: Every day | SUBCUTANEOUS | Status: DC

## 2019-03-16 MED ORDER — ENOXAPARIN SODIUM 40 MG/0.4ML ~~LOC~~ SOLN: 40.0000 mg | SUBCUTANEOUS | Status: DC

## 2019-03-16 MED ORDER — INSULIN ASPART 100 UNIT/ML ~~LOC~~ SOLN
0.0000 [IU] | Freq: Three times a day (TID) | SUBCUTANEOUS | Status: DC
  Administered 2019-03-16 (×2): 2 [IU] via SUBCUTANEOUS

## 2019-03-16 NOTE — Discharge Instructions (Signed)
You must stop smoking and keep your diabetes under good control to prevent another heart attack.  Please follow up with Dr Terrence Dupont as an outpt. I have called him today and notified him that you were admitted.   You were cared for by a hospitalist during your hospital stay. If you have any questions about your discharge medications or the care you received while you were in the hospital after you are discharged, you can call the unit and asked to speak with the hospitalist on call if the hospitalist that took care of you is not available. Once you are discharged, your primary care physician will handle any further medical issues.   Please note that NO REFILLS for any discharge medications will be authorized once you are discharged, as it is imperative that you return to your primary care physician (or establish a relationship with a primary care physician if you do not have one) for your aftercare needs so that they can reassess your need for medications and monitor your lab values.  Please take all your medications with you for your next visit with your Primary MD. Please ask your Primary MD to get all Hospital records sent to his/her office. Please request your Primary MD to go over all hospital test results at the follow up.   If you experience worsening of your admission symptoms, develop shortness of breath, chest pain, suicidal or homicidal thoughts or a life threatening emergency, you must seek medical attention immediately by calling 911 or calling your MD. If you have any chest pain, please notify his office ASAP.    You must read the complete instructions/literature along with all the possible adverse reactions/side effects for all the medicines you take including new medications that have been prescribed to you. Take new medicines after you have completely understood and accpet all the possible adverse reactions/side effects.    Do not drive when taking pain medications or sedatives.       Do not take more than prescribed Pain, Sleep and Anxiety Medications   If you have smoked or chewed Tobacco in the last 2 yrs please stop. Stop any regular alcohol  and or recreational drug use.   Wear Seat belts while driving.

## 2019-03-16 NOTE — TOC Transition Note (Signed)
Transition of Care [facility]) - CM/SW Discharge Note   Patient Details  Name: Derrick Mosley MRN: [id number] Date of Birth: September 08, 1976  [facility] CM/SW Contact:  Sherald Barge, RN Phone Number: 03/16/2019, 11:22 AM   Clinical Narrative:   TOC triggered assessment for righ readmission risk. Risk score high d/t #Rx's. Pt from home, ind with ALD's. Has insurance and PCP. Pt well established with cardiology. Multiple co-morbidities. Continues to smoke and has uncontrolled DM. This it pt's only admission in past 6 months. Pt will follow closely with cards. Pt would best be served by CM located within PCP or cardiology office. CM will reach out to those offices and if services available will request.    Expected Discharge Plan: Home/Self Care    Expected Discharge Plan and Services Expected Discharge Plan: Home/Self Care       Living arrangements for the past 2 months: Single Family Home Expected Discharge Date: 03/16/19                  Prior Living Arrangements/Services Living arrangements for the past 2 months: Single Family Home Lives with:: Relatives   Do you feel safe going back to the place where you live?: Yes      Need for Family Participation in Patient Care: Yes (Comment) Care giver support system in place?: Yes (comment)   Criminal Activity/Legal Involvement Pertinent to Current Situation/Hospitalization: Yes - Comment as needed  Activities of Daily Living Home Assistive Devices/Equipment: None ADL Screening (condition at time of admission) Patient's cognitive ability adequate to safely complete daily activities?: Yes Is the patient deaf or have difficulty hearing?: No Does the patient have difficulty seeing, even when wearing glasses/contacts?: No Does the patient have difficulty concentrating, remembering, or making decisions?: No Patient able to express need for assistance with ADLs?: Yes Does the patient have difficulty dressing or  bathing?: No Independently performs ADLs?: Yes (appropriate for developmental age) Does the patient have difficulty walking or climbing stairs?: No Weakness of Legs: None Weakness of Arms/Hands: None  Permission Sought/Granted Permission sought to share information with : (n/a) Permission granted to share information with : (n/a)              Emotional Assessment       Orientation: : Oriented to  Time, Oriented to Self, Oriented to Place, Oriented to Situation Alcohol / Substance Use: Tobacco Use    Admission diagnosis:  Chest pain, unspecified type [R07.9] Patient Active Problem List   Diagnosis Date Noted  . Hyperkalemia 03/16/2019  . Acute coronary syndrome ([place]) 03/15/2019  . Normocytic anemia 03/15/2019  . Hypertension 03/15/2019  . Obesity 02/26/2019  . Hyperlipidemia 04/07/2018  . Acute MI, inferolateral wall ([place]) 01/26/2018  . Lumbar radiculopathy 01/01/2018  . 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 Personal history of noncompliance with medical treatment, presenting hazards to health 07/08/2017  . CKD (chronic kidney disease) stage 3, GFR 30-59 ml/min (HCC) 01/11/2017  . Foot ulcer ([place]) 01/11/2017  . Diabetic foot ulcer ([place]) 04/06/2015  . Current smoker 04/06/2015  . Migraine headache 03/29/2015  . Paresthesia of both hands 03/29/2015  . Hematuria 03/29/2015  . Legionella pneumonia ([place])   . Hyponatremia   . Poorly controlled type 2 diabetes mellitus with circulatory disorder ([place]) 04/06/1999   PCP:  [facility] [place]:   [place], [place] - [place] [address] [place]  [address] Phone: [phone]  Fax: [phone]   Readmission Risk Interventions Readmission Risk Prevention Plan 03/16/2019  Transportation Screening Complete  PCP or Specialist Appt within 3-5 Days Complete  HRI or [place] Complete  Social Work Consult for  [place] Planning/Counseling Complete  Palliative Care Screening Not Applicable  Medication Review [employer]) Complete  Some recent data might be hidden
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
## 2019-03-16 NOTE — Progress Notes (Signed)
Nsg Discharge Note  Admit Date:  03/15/2019 Discharge date: 03/16/2019   [name] to be D/C'd Home per MD order.  AVS completed.  Copy for chart, and copy for patient signed, and dated. Patient/caregiver able to verbalize understanding.  Discharge Medication: Allergies as of 03/16/2019   No Known Allergies     Medication List    TAKE these medications   Accu-Chek FastClix Lancets Misc Use 3 times a day   amLODipine 2.5 MG tablet Commonly known as:  NORVASC Take 1 tablet (2.5 mg total) by mouth daily.   amLODipine 5 MG tablet Commonly known as:  NORVASC Take 5 mg by mouth daily.   aspirin 81 MG EC tablet Take 1 tablet (81 mg total) by mouth daily.   atorvastatin 80 MG tablet Commonly known as:  LIPITOR Take 1 tablet (80 mg total) by mouth daily at 6 PM.   furosemide 20 MG tablet Commonly known as:  LASIX TAKE 1 TABLET BY MOUTH ONCE DAILY IN THE MORNING   gabapentin 100 MG capsule Commonly known as:  NEURONTIN Take 1 capsule (100 mg total) by mouth at bedtime.   glucose blood test strip Commonly known as:  Accu-Chek Guide Use 3 times a day   HYDROcodone-acetaminophen 10-325 MG tablet Commonly known as:  NORCO Take 1 tablet by mouth 2 (two) times daily as needed.   insulin aspart 100 UNIT/ML FlexPen Commonly known as:  NovoLOG FlexPen Inject 12-16 Units into the skin 3 (three) times daily before meals.   Insulin Glargine 100 UNIT/ML Solostar Pen Commonly known as:  Lantus SoloStar Inject 25 Units into the skin 2 (two) times daily.   Insulin Pen Needle 31G X 8 MM Misc Commonly known as:  B-D ULTRAFINE III SHORT PEN 1 each by Does not apply route as directed.   INSULIN SYRINGE 1CC/30GX1/2" 30G X 1/2" 1 ML Misc 1 Device by Does not apply route 2 (two) times daily before a meal.   methocarbamol 500 MG tablet Commonly known as:  ROBAXIN methocarbamol 500 mg tablet  Take 1 tablet 3 times a day by oral route as needed.   metoprolol tartrate 25 MG  tablet Commonly known as:  LOPRESSOR Take 1 tablet (25 mg total) by mouth 2 (two) times daily.   Nicoderm CQ 14 mg/24hr patch Generic drug:  nicotine Nicoderm CQ   nitroGLYCERIN 0.4 MG SL tablet Commonly known as:  NITROSTAT Place 1 tablet (0.4 mg total) under the tongue every 5 (five) minutes as needed for chest pain.   nystatin powder Commonly known as:  MYCOSTATIN/NYSTOP Apply topically 2 (two) times daily.   nystatin-triamcinolone ointment Commonly known as:  MYCOLOG Apply between 4th and 5th toe left foot once daily   Semaglutide(0.25 or 0.5MG /DOS) 2 MG/1.5ML Sopn Commonly known as:  Ozempic (0.25 or 0.5 MG/DOSE) Inject 0.25 mg into the skin once a week.   ticagrelor 90 MG Tabs tablet Commonly known as:  BRILINTA Take 1 tablet (90 mg total) by mouth 2 (two) times daily.       Discharge Assessment: Vitals:   03/16/19 0142 03/16/19 0555  BP: (!) 169/99 (!) 146/91  Pulse: 76 71  Resp: 20 20  Temp: 98.4 F (36.9 C) 98.2 F (36.8 C)  SpO2: 96% 95%   Skin clean, dry and intact without evidence of skin break down, no evidence of skin tears noted. IV catheter discontinued intact. Site without signs and symptoms of complications - no redness or edema noted at insertion site, patient denies  c/o pain - only slight tenderness at site.  Dressing with slight pressure applied.  D/c Instructions-Education: Discharge instructions given to patient/family with verbalized understanding. D/c education completed with patient/family including follow up instructions, medication list, d/c activities limitations if indicated, with other d/c instructions as indicated by MD - patient able to verbalize understanding, all questions fully answered. Patient instructed to return to ED, call 911, or call MD for any changes in condition.  Patient escorted via [place], and D/C home via private auto.  Loa Socks, RN 03/16/2019 12:38 PM
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
## 2019-03-17 LAB — HIV ANTIBODY (ROUTINE TESTING W REFLEX): HIV Screen 4th Generation wRfx: NONREACTIVE

## 2019-03-20 DIAGNOSIS — E785 Hyperlipidemia, unspecified: Secondary | ICD-10-CM | POA: Diagnosis not present

## 2019-03-20 DIAGNOSIS — D649 Anemia, unspecified: Secondary | ICD-10-CM | POA: Diagnosis not present

## 2019-03-20 DIAGNOSIS — E1122 Type 2 diabetes mellitus with diabetic chronic kidney disease: Secondary | ICD-10-CM | POA: Diagnosis not present

## 2019-03-20 DIAGNOSIS — I25118 Atherosclerotic heart disease of native coronary artery with other forms of angina pectoris: Secondary | ICD-10-CM | POA: Diagnosis not present

## 2019-03-20 DIAGNOSIS — I10 Essential (primary) hypertension: Secondary | ICD-10-CM | POA: Diagnosis not present

## 2019-03-20 DIAGNOSIS — N189 Chronic kidney disease, unspecified: Secondary | ICD-10-CM | POA: Diagnosis not present

## 2019-03-20 DIAGNOSIS — I739 Peripheral vascular disease, unspecified: Secondary | ICD-10-CM | POA: Diagnosis not present

## 2019-03-20 DIAGNOSIS — F17290 Nicotine dependence, other tobacco product, uncomplicated: Secondary | ICD-10-CM | POA: Diagnosis not present

## 2019-03-20 DIAGNOSIS — I252 Old myocardial infarction: Secondary | ICD-10-CM | POA: Diagnosis not present

## 2019-03-20 DIAGNOSIS — E1140 Type 2 diabetes mellitus with diabetic neuropathy, unspecified: Secondary | ICD-10-CM | POA: Diagnosis not present

## 2019-03-25 ENCOUNTER — Telehealth: Payer: Self-pay

## 2019-03-25 MED ORDER — DULAGLUTIDE 0.75 MG/0.5ML ~~LOC~~ SOAJ: SUBCUTANEOUS | 1 refills | Status: DC

## 2019-03-25 NOTE — Telephone Encounter (Signed)
PA for Ozempic has been denied because patient has not tried and failed two preferred medications. The letter from insurance does not state which medications are preferred.

## 2019-03-25 NOTE — Telephone Encounter (Signed)
New RX sent

## 2019-03-25 NOTE — Telephone Encounter (Signed)
Let's try to send Trulicity 7.32 mg weekly x 4 pens, with 1 refill

## 2019-03-26 ENCOUNTER — Telehealth: Payer: Self-pay

## 2019-03-26 NOTE — Telephone Encounter (Signed)
Patient is on Medicaid and they will not approve anything unless patient has tried and failed Metformin. I do not see documentation that he has.  Please advise.

## 2019-03-26 NOTE — Telephone Encounter (Signed)
PA sent for Trulicity.

## 2019-03-26 NOTE — Telephone Encounter (Signed)
Metformin is Contra indicated with his kidney function - he has CKD

## 2019-04-16 DIAGNOSIS — Z720 Tobacco use: Secondary | ICD-10-CM | POA: Diagnosis not present

## 2019-04-16 DIAGNOSIS — N141 Nephropathy induced by other drugs, medicaments and biological substances: Secondary | ICD-10-CM | POA: Diagnosis not present

## 2019-04-16 DIAGNOSIS — I129 Hypertensive chronic kidney disease with stage 1 through stage 4 chronic kidney disease, or unspecified chronic kidney disease: Secondary | ICD-10-CM | POA: Diagnosis not present

## 2019-04-16 DIAGNOSIS — E785 Hyperlipidemia, unspecified: Secondary | ICD-10-CM | POA: Diagnosis not present

## 2019-04-16 DIAGNOSIS — N2581 Secondary hyperparathyroidism of renal origin: Secondary | ICD-10-CM | POA: Diagnosis not present

## 2019-04-16 DIAGNOSIS — D631 Anemia in chronic kidney disease: Secondary | ICD-10-CM | POA: Diagnosis not present

## 2019-04-16 DIAGNOSIS — E1129 Type 2 diabetes mellitus with other diabetic kidney complication: Secondary | ICD-10-CM | POA: Diagnosis not present

## 2019-04-16 DIAGNOSIS — T508X5A Adverse effect of diagnostic agents, initial encounter: Secondary | ICD-10-CM | POA: Diagnosis not present

## 2019-04-16 DIAGNOSIS — I2510 Atherosclerotic heart disease of native coronary artery without angina pectoris: Secondary | ICD-10-CM | POA: Diagnosis not present

## 2019-04-16 DIAGNOSIS — N183 Chronic kidney disease, stage 3 (moderate): Secondary | ICD-10-CM | POA: Diagnosis not present

## 2019-04-16 DIAGNOSIS — N179 Acute kidney failure, unspecified: Secondary | ICD-10-CM | POA: Diagnosis not present

## 2019-04-16 DIAGNOSIS — I213 ST elevation (STEMI) myocardial infarction of unspecified site: Secondary | ICD-10-CM | POA: Diagnosis not present

## 2019-04-27 ENCOUNTER — Telehealth: Payer: Self-pay

## 2019-04-27 MED ORDER — BYDUREON 2 MG ~~LOC~~ PEN: 2.0000 mg | PEN_INJECTOR | SUBCUTANEOUS | 4 refills | Status: DC

## 2019-04-27 NOTE — Telephone Encounter (Signed)
Please advise if you would like to try a PA.

## 2019-04-27 NOTE — Telephone Encounter (Signed)
No, Bydureon should be covered without a PA

## 2019-04-27 NOTE — Telephone Encounter (Signed)
He has chronic kidney disease and his GFR does not allow [place] to use metformin.

## 2019-04-27 NOTE — Telephone Encounter (Signed)
I reviewed medication coverage list and they appear to cover Bydureon and Victoza.  Bydureon is once a week, exercise once a day.  Please try to send Bydureon 2 mg taken once a week.

## 2019-04-27 NOTE — Telephone Encounter (Signed)
It would only be approved if he has tried and failed Metformin.

## 2019-04-27 NOTE — Telephone Encounter (Signed)
Medicaid has denied PA for Trulicity.

## 2019-04-30 NOTE — Telephone Encounter (Signed)
PA faxed

## 2019-05-04 ENCOUNTER — Other Ambulatory Visit: Payer: Self-pay

## 2019-05-04 ENCOUNTER — Ambulatory Visit (INDEPENDENT_AMBULATORY_CARE_PROVIDER_SITE_OTHER): Payer: Medicare Other | Admitting: Podiatry

## 2019-05-04 ENCOUNTER — Encounter: Payer: Self-pay | Admitting: Podiatry

## 2019-05-04 DIAGNOSIS — B353 Tinea pedis: Secondary | ICD-10-CM | POA: Diagnosis not present

## 2019-05-04 DIAGNOSIS — B351 Tinea unguium: Secondary | ICD-10-CM

## 2019-05-04 DIAGNOSIS — E1142 Type 2 diabetes mellitus with diabetic polyneuropathy: Secondary | ICD-10-CM | POA: Diagnosis not present

## 2019-05-04 NOTE — Patient Instructions (Signed)
Diabetes Mellitus and Foot Care Foot care is an important part of your health, especially when you have diabetes. Diabetes may cause you to have problems because of poor blood flow (circulation) to your feet and legs, which can cause your skin to:  Become thinner and drier.  Break more easily.  Heal more slowly.  Peel and crack. You may also have nerve damage (neuropathy) in your legs and feet, causing decreased feeling in them. This means that you may not notice minor injuries to your feet that could lead to more serious problems. Noticing and addressing any potential problems early is the best way to prevent future foot problems. How to care for your feet Foot hygiene  Wash your feet daily with warm water and mild soap. Do not use hot water. Then, pat your feet and the areas between your toes until they are completely dry. Do not soak your feet as this can dry your skin.  Trim your toenails straight across. Do not dig under them or around the cuticle. File the edges of your nails with an emery board or nail file.  Apply a moisturizing lotion or petroleum jelly to the skin on your feet and to dry, brittle toenails. Use lotion that does not contain alcohol and is unscented. Do not apply lotion between your toes. Shoes and socks  Wear clean socks or stockings every day. Make sure they are not too tight. Do not wear knee-high stockings since they may decrease blood flow to your legs.  Wear shoes that fit properly and have enough cushioning. Always look in your shoes before you put them on to be sure there are no objects inside.  To break in new shoes, wear them for just a few hours a day. This prevents injuries on your feet. Wounds, scrapes, corns, and calluses  Check your feet daily for blisters, cuts, bruises, sores, and redness. If you cannot see the bottom of your feet, use a mirror or ask someone for help.  Do not cut corns or calluses or try to remove them with medicine.  If you  find a minor scrape, cut, or break in the skin on your feet, keep it and the skin around it clean and dry. You may clean these areas with mild soap and water. Do not clean the area with peroxide, alcohol, or iodine.  If you have a wound, scrape, corn, or callus on your foot, look at it several times a day to make sure it is healing and not infected. Check for: ? Redness, swelling, or pain. ? Fluid or blood. ? Warmth. ? Pus or a bad smell. General instructions  Do not cross your legs. This may decrease blood flow to your feet.  Do not use heating pads or hot water bottles on your feet. They may burn your skin. If you have lost feeling in your feet or legs, you may not know this is happening until it is too late.  Protect your feet from hot and cold by wearing shoes, such as at the beach or on hot pavement.  Schedule a complete foot exam at least once a year (annually) or more often if you have foot problems. If you have foot problems, report any cuts, sores, or bruises to your health care provider immediately. Contact a health care provider if:  You have a medical condition that increases your risk of infection and you have any cuts, sores, or bruises on your feet.  You have an injury that is not   healing.  You have redness on your legs or feet.  You feel burning or tingling in your legs or feet.  You have pain or cramps in your legs and feet.  Your legs or feet are numb.  Your feet always feel cold.  You have pain around a toenail. Get help right away if:  You have a wound, scrape, corn, or callus on your foot and: ? You have pain, swelling, or redness that gets worse. ? You have fluid or blood coming from the wound, scrape, corn, or callus. ? Your wound, scrape, corn, or callus feels warm to the touch. ? You have pus or a bad smell coming from the wound, scrape, corn, or callus. ? You have a fever. ? You have a red line going up your leg. Summary  Check your feet every day  for cuts, sores, red spots, swelling, and blisters.  Moisturize feet and legs daily.  Wear shoes that fit properly and have enough cushioning.  If you have foot problems, report any cuts, sores, or bruises to your health care provider immediately.  Schedule a complete foot exam at least once a year (annually) or more often if you have foot problems. This information is not intended to replace advice given to you by your health care provider. Make sure you discuss any questions you have with your health care provider. Document Released: 10/12/2000 Document Revised: 11/27/2017 Document Reviewed: 11/16/2016 Elsevier Patient Education  2020 [employer]  

## 2019-05-05 DIAGNOSIS — M5380 Other specified dorsopathies, site unspecified: Secondary | ICD-10-CM | POA: Diagnosis not present

## 2019-05-05 DIAGNOSIS — G894 Chronic pain syndrome: Secondary | ICD-10-CM | POA: Diagnosis not present

## 2019-05-05 DIAGNOSIS — M5416 Radiculopathy, lumbar region: Secondary | ICD-10-CM | POA: Diagnosis not present

## 2019-05-05 NOTE — Progress Notes (Signed)
Subjective: Derrick Mosley presents with diabetes, diabetic neuropathy for at risk foot care.  He has history of amputation of right fifth ray.  He relates no pedal problems on today's visit.   Patient states he continues to have neuropathy symptoms which do not resolve with his Neurontin.  P[facility], [facility] is his PCP.   Current Outpatient Medications:  .  ACCU-CHEK FASTCLIX LANCETS MISC, Use 3 times a day, Disp: 300 each, Rfl: 3 .  amLODipine (NORVASC) 2.5 MG tablet, Take 1 tablet (2.5 mg total) by mouth daily., Disp: 30 tablet, Rfl: 3 .  amLODipine (NORVASC) 5 MG tablet, Take 5 mg by mouth daily., Disp: , Rfl: 3 .  aspirin EC 81 MG EC tablet, Take 1 tablet (81 mg total) by mouth daily., Disp: 30 tablet, Rfl: 3 .  atorvastatin (LIPITOR) 80 MG tablet, Take 1 tablet (80 mg total) by mouth daily at 6 PM., Disp: 30 tablet, Rfl: 3 .  benzonatate (TESSALON) 200 MG capsule, benzonatate 200 mg capsule  TAKE 1 CAPSULE BY MOUTH THREE TIMES DAILY AS NEEDED FOR COUGH SWALLOW WHOLE DO NOT CHEW, Disp: , Rfl:  .  Exenatide ER (BYDUREON) 2 MG PEN, Inject 2 mg into the skin once a week., Disp: 4 each, Rfl: 4 .  furosemide (LASIX) 20 MG tablet, TAKE 1 TABLET BY MOUTH ONCE DAILY IN THE MORNING, Disp: , Rfl:  .  gabapentin (NEURONTIN) 100 MG capsule, Take 1 capsule (100 mg total) by mouth at bedtime., Disp: 90 capsule, Rfl: 3 .  glucose blood (ACCU-CHEK GUIDE) test strip, Use 3 times a day, Disp: 300 each, Rfl: 3 .  HYDROcodone-acetaminophen (NORCO) 10-325 MG tablet, Take 1 tablet by mouth 2 (two) times daily as needed., Disp: , Rfl: 0 .  insulin aspart (NOVOLOG FLEXPEN) 100 UNIT/ML FlexPen, Inject 12-16 Units into the skin 3 (three) times daily before meals., Disp: 30 mL, Rfl: 5 .  Insulin Glargine (LANTUS SOLOSTAR) 100 UNIT/ML Solostar Pen, Inject 25 Units into the skin 2 (two) times daily., Disp: 10 pen, Rfl: 5 .  Insulin Pen Needle (B-D ULTRAFINE III SHORT PEN) 31G X 8 MM MISC, 1 each by Does not  apply route as directed., Disp: 100 each, Rfl: 3 .  Insulin Syringe-Needle U-100 (INSULIN SYRINGE 1CC/30GX1/2") 30G X 1/2" 1 ML MISC, 1 Device by Does not apply route 2 (two) times daily before a meal., Disp: 100 each, Rfl: 0 .  methocarbamol (ROBAXIN) 500 MG tablet, methocarbamol 500 mg tablet  Take 1 tablet 3 times a day by oral route as needed., Disp: , Rfl:  .  metoprolol tartrate (LOPRESSOR) 25 MG tablet, Take 1 tablet (25 mg total) by mouth 2 (two) times daily., Disp: 60 tablet, Rfl: 3 .  nicotine (NICODERM CQ) 14 mg/24hr patch, Nicoderm CQ, Disp: , Rfl:  .  nitroGLYCERIN (NITROSTAT) 0.4 MG SL tablet, Place 1 tablet (0.4 mg total) under the tongue every 5 (five) minutes as needed for chest pain., Disp: 25 tablet, Rfl: 12 .  nystatin (MYCOSTATIN/NYSTOP) powder, Apply topically 2 (two) times daily., Disp: 15 g, Rfl: 2 .  nystatin-triamcinolone ointment (MYCOLOG), Apply between 4th and 5th toe left foot once daily, Disp: 30 g, Rfl: 1 .  promethazine (PHENERGAN) 25 MG tablet, promethazine 25 mg tablet  TAKE 1 TABLET BY MOUTH EVERY 6 HOURS AS NEEDED, Disp: , Rfl:  .  ticagrelor (BRILINTA) 90 MG TABS tablet, Take 1 tablet (90 mg total) by mouth 2 (two) times daily., Disp: 60 tablet, Rfl: 11  No Known Allergies  Objective: There were no vitals filed for this visit.  Vascular Examination: Capillary refill time immediate x9 digits.  Dorsalis pedis pulse palpable left foot; diminished right foot.  Posterior tibial pulses palpable left foot; diminished right foot.  Digital hair sparse x9 digits.  Skin temperature gradient within normal limits bilaterally.  Dermatological Examination: Skin with normal turgor, texture and tone b/l. Toenails left great toe and digits 2 through 5 bilaterally are discolored, thick, dystrophic with subungual debris and pain with palpation to nailbeds due to thickness of nails.  Mild interdigital maceration noted third webspace bilaterally and fourth webspace left foot.   No breaks in skin.  No signs of deep space infection.  Hyperkeratotic lesion(s) dorsal PIPJ right fourth digit. No erythema, no edema, no drainage, no flocculence noted.   Musculoskeletal: Muscle strength 5/5 to all LE muscle groups.  Status post fifth ray amputation right foot.  Well-healed surgical scar noted with no erythema, no edema, no drainage, no flocculence noted.  No pain, crepitus or joint limitation with passive/active ROM.  Neurological: Sensation diminished with 10 gram monofilament.  Assessment: 1. Painful onychomycosis toenails x 9 digits 2. Interdigital tinea pedis 3. Corn right fourth digit 4. Status post fifth ray amputation 5. NIDDM with Diabetic neuropathy  Plan: 1. Continue diabetic foot care principles. Literature dispensed on today. 2. None of the drying agents seem to be covered by his insurance.  I have asked him to use Lotrimin Spray Powder and spray between and under his toes daily. He related understanding. 3. We will also get cash prices for him from [place] for their neuropathy cream and call him with those cash prices. 4. Toenails 1-5 b/l were debrided in length and girth without iatrogenic bleeding. 5. Corn(s) pared right fourth digit utilizing sterile scalpel blade without incident.  6. Patient to continue soft, supportive shoe gear 7. Patient to report any pedal injuries to medical professional  8. Follow up 3 months.  9. Patient/POA to call should there be a concern in the interim.

## 2019-06-19 DIAGNOSIS — D649 Anemia, unspecified: Secondary | ICD-10-CM | POA: Diagnosis not present

## 2019-06-19 DIAGNOSIS — I252 Old myocardial infarction: Secondary | ICD-10-CM | POA: Diagnosis not present

## 2019-06-19 DIAGNOSIS — I10 Essential (primary) hypertension: Secondary | ICD-10-CM | POA: Diagnosis not present

## 2019-06-19 DIAGNOSIS — N189 Chronic kidney disease, unspecified: Secondary | ICD-10-CM | POA: Diagnosis not present

## 2019-06-19 DIAGNOSIS — E785 Hyperlipidemia, unspecified: Secondary | ICD-10-CM | POA: Diagnosis not present

## 2019-06-19 DIAGNOSIS — I25118 Atherosclerotic heart disease of native coronary artery with other forms of angina pectoris: Secondary | ICD-10-CM | POA: Diagnosis not present

## 2019-06-19 DIAGNOSIS — E1122 Type 2 diabetes mellitus with diabetic chronic kidney disease: Secondary | ICD-10-CM | POA: Diagnosis not present

## 2019-06-19 DIAGNOSIS — F17290 Nicotine dependence, other tobacco product, uncomplicated: Secondary | ICD-10-CM | POA: Diagnosis not present

## 2019-06-19 DIAGNOSIS — I739 Peripheral vascular disease, unspecified: Secondary | ICD-10-CM | POA: Diagnosis not present

## 2019-06-22 DIAGNOSIS — D631 Anemia in chronic kidney disease: Secondary | ICD-10-CM | POA: Diagnosis not present

## 2019-06-22 DIAGNOSIS — N179 Acute kidney failure, unspecified: Secondary | ICD-10-CM | POA: Diagnosis not present

## 2019-06-22 DIAGNOSIS — N189 Chronic kidney disease, unspecified: Secondary | ICD-10-CM | POA: Diagnosis not present

## 2019-06-22 DIAGNOSIS — I213 ST elevation (STEMI) myocardial infarction of unspecified site: Secondary | ICD-10-CM | POA: Diagnosis not present

## 2019-06-22 DIAGNOSIS — Z720 Tobacco use: Secondary | ICD-10-CM | POA: Diagnosis not present

## 2019-06-22 DIAGNOSIS — I129 Hypertensive chronic kidney disease with stage 1 through stage 4 chronic kidney disease, or unspecified chronic kidney disease: Secondary | ICD-10-CM | POA: Diagnosis not present

## 2019-06-22 DIAGNOSIS — E1129 Type 2 diabetes mellitus with other diabetic kidney complication: Secondary | ICD-10-CM | POA: Diagnosis not present

## 2019-06-22 DIAGNOSIS — I739 Peripheral vascular disease, unspecified: Secondary | ICD-10-CM | POA: Diagnosis not present

## 2019-06-22 DIAGNOSIS — E1122 Type 2 diabetes mellitus with diabetic chronic kidney disease: Secondary | ICD-10-CM | POA: Diagnosis not present

## 2019-06-22 DIAGNOSIS — N183 Chronic kidney disease, stage 3 (moderate): Secondary | ICD-10-CM | POA: Diagnosis not present

## 2019-07-24 DIAGNOSIS — N183 Chronic kidney disease, stage 3 (moderate): Secondary | ICD-10-CM | POA: Diagnosis not present

## 2019-07-29 DIAGNOSIS — I739 Peripheral vascular disease, unspecified: Secondary | ICD-10-CM | POA: Diagnosis not present

## 2019-07-29 DIAGNOSIS — E1129 Type 2 diabetes mellitus with other diabetic kidney complication: Secondary | ICD-10-CM | POA: Diagnosis not present

## 2019-07-29 DIAGNOSIS — Z89421 Acquired absence of other right toe(s): Secondary | ICD-10-CM | POA: Diagnosis not present

## 2019-07-29 DIAGNOSIS — Z6829 Body mass index (BMI) 29.0-29.9, adult: Secondary | ICD-10-CM | POA: Diagnosis not present

## 2019-07-29 DIAGNOSIS — N183 Chronic kidney disease, stage 3 (moderate): Secondary | ICD-10-CM | POA: Diagnosis not present

## 2019-07-29 DIAGNOSIS — E1159 Type 2 diabetes mellitus with other circulatory complications: Secondary | ICD-10-CM | POA: Diagnosis not present

## 2019-07-29 DIAGNOSIS — I2510 Atherosclerotic heart disease of native coronary artery without angina pectoris: Secondary | ICD-10-CM | POA: Diagnosis not present

## 2019-07-29 DIAGNOSIS — E782 Mixed hyperlipidemia: Secondary | ICD-10-CM | POA: Diagnosis not present

## 2019-07-29 DIAGNOSIS — M545 Low back pain: Secondary | ICD-10-CM | POA: Diagnosis not present

## 2019-07-29 LAB — HEMOGLOBIN A1C: Hemoglobin A1C: 14.9

## 2019-07-30 LAB — LIPID PANEL
Cholesterol: 145 (ref 0–200)
HDL: 27 — AB (ref 35–70)
LDL Cholesterol: 45
Triglycerides: 500 — AB (ref 40–160)

## 2019-07-31 DIAGNOSIS — I208 Other forms of angina pectoris: Secondary | ICD-10-CM | POA: Diagnosis not present

## 2019-07-31 DIAGNOSIS — E785 Hyperlipidemia, unspecified: Secondary | ICD-10-CM | POA: Diagnosis not present

## 2019-07-31 DIAGNOSIS — I10 Essential (primary) hypertension: Secondary | ICD-10-CM | POA: Diagnosis not present

## 2019-07-31 DIAGNOSIS — F17290 Nicotine dependence, other tobacco product, uncomplicated: Secondary | ICD-10-CM | POA: Diagnosis not present

## 2019-07-31 DIAGNOSIS — I739 Peripheral vascular disease, unspecified: Secondary | ICD-10-CM | POA: Diagnosis not present

## 2019-07-31 DIAGNOSIS — D649 Anemia, unspecified: Secondary | ICD-10-CM | POA: Diagnosis not present

## 2019-08-03 ENCOUNTER — Ambulatory Visit: Payer: Medicaid Other | Admitting: Podiatry

## 2019-08-12 DIAGNOSIS — N183 Chronic kidney disease, stage 3 unspecified: Secondary | ICD-10-CM | POA: Diagnosis not present

## 2019-09-20 ENCOUNTER — Emergency Department (HOSPITAL_COMMUNITY): Payer: Medicare Other

## 2019-09-20 ENCOUNTER — Other Ambulatory Visit: Payer: Self-pay

## 2019-09-20 ENCOUNTER — Encounter (HOSPITAL_COMMUNITY): Payer: Self-pay | Admitting: Emergency Medicine

## 2019-09-20 ENCOUNTER — Observation Stay (HOSPITAL_COMMUNITY)
Admission: EM | Admit: 2019-09-20 | Discharge: 2019-09-21 | Disposition: A | Discharge status: Home / Self Care | Payer: Medicare Other | Attending: Internal Medicine | Admitting: Internal Medicine

## 2019-09-20 DIAGNOSIS — I10 Essential (primary) hypertension: Secondary | ICD-10-CM

## 2019-09-20 DIAGNOSIS — Z9119 Patient's noncompliance with other medical treatment and regimen: Secondary | ICD-10-CM | POA: Diagnosis not present

## 2019-09-20 DIAGNOSIS — E1159 Type 2 diabetes mellitus with other circulatory complications: Secondary | ICD-10-CM | POA: Insufficient documentation

## 2019-09-20 DIAGNOSIS — E871 Hypo-osmolality and hyponatremia: Secondary | ICD-10-CM | POA: Diagnosis not present

## 2019-09-20 DIAGNOSIS — Z79899 Other long term (current) drug therapy: Secondary | ICD-10-CM | POA: Diagnosis not present

## 2019-09-20 DIAGNOSIS — Z7982 Long term (current) use of aspirin: Secondary | ICD-10-CM | POA: Insufficient documentation

## 2019-09-20 DIAGNOSIS — E1142 Type 2 diabetes mellitus with diabetic polyneuropathy: Secondary | ICD-10-CM | POA: Diagnosis not present

## 2019-09-20 DIAGNOSIS — N179 Acute kidney failure, unspecified: Secondary | ICD-10-CM | POA: Diagnosis not present

## 2019-09-20 DIAGNOSIS — E875 Hyperkalemia: Secondary | ICD-10-CM | POA: Diagnosis not present

## 2019-09-20 DIAGNOSIS — Z794 Long term (current) use of insulin: Secondary | ICD-10-CM | POA: Insufficient documentation

## 2019-09-20 DIAGNOSIS — Z955 Presence of coronary angioplasty implant and graft: Secondary | ICD-10-CM | POA: Insufficient documentation

## 2019-09-20 DIAGNOSIS — Z7902 Long term (current) use of antithrombotics/antiplatelets: Secondary | ICD-10-CM | POA: Insufficient documentation

## 2019-09-20 DIAGNOSIS — I129 Hypertensive chronic kidney disease with stage 1 through stage 4 chronic kidney disease, or unspecified chronic kidney disease: Secondary | ICD-10-CM | POA: Diagnosis not present

## 2019-09-20 DIAGNOSIS — Z89421 Acquired absence of other right toe(s): Secondary | ICD-10-CM | POA: Insufficient documentation

## 2019-09-20 DIAGNOSIS — R42 Dizziness and giddiness: Secondary | ICD-10-CM | POA: Diagnosis not present

## 2019-09-20 DIAGNOSIS — Z20828 Contact with and (suspected) exposure to other viral communicable diseases: Secondary | ICD-10-CM | POA: Insufficient documentation

## 2019-09-20 DIAGNOSIS — N183 Chronic kidney disease, stage 3 unspecified: Secondary | ICD-10-CM | POA: Diagnosis not present

## 2019-09-20 DIAGNOSIS — I2510 Atherosclerotic heart disease of native coronary artery without angina pectoris: Secondary | ICD-10-CM | POA: Insufficient documentation

## 2019-09-20 DIAGNOSIS — I252 Old myocardial infarction: Secondary | ICD-10-CM | POA: Diagnosis not present

## 2019-09-20 DIAGNOSIS — E785 Hyperlipidemia, unspecified: Secondary | ICD-10-CM | POA: Insufficient documentation

## 2019-09-20 DIAGNOSIS — H8110 Benign paroxysmal vertigo, unspecified ear: Secondary | ICD-10-CM | POA: Diagnosis not present

## 2019-09-20 DIAGNOSIS — F17210 Nicotine dependence, cigarettes, uncomplicated: Secondary | ICD-10-CM | POA: Diagnosis not present

## 2019-09-20 DIAGNOSIS — Z03818 Encounter for observation for suspected exposure to other biological agents ruled out: Secondary | ICD-10-CM | POA: Diagnosis not present

## 2019-09-20 DIAGNOSIS — Z833 Family history of diabetes mellitus: Secondary | ICD-10-CM | POA: Diagnosis not present

## 2019-09-20 DIAGNOSIS — G43909 Migraine, unspecified, not intractable, without status migrainosus: Secondary | ICD-10-CM | POA: Insufficient documentation

## 2019-09-20 DIAGNOSIS — E1122 Type 2 diabetes mellitus with diabetic chronic kidney disease: Secondary | ICD-10-CM | POA: Insufficient documentation

## 2019-09-20 LAB — DIFFERENTIAL
Abs Immature Granulocytes: 0.06 10*3/uL (ref 0.00–0.07)
Basophils Absolute: 0 10*3/uL (ref 0.0–0.1)
Basophils Relative: 0 %
Eosinophils Absolute: 0.1 10*3/uL (ref 0.0–0.5)
Eosinophils Relative: 1 %
Immature Granulocytes: 1 %
Lymphocytes Relative: 17 %
Lymphs Abs: 1.6 10*3/uL (ref 0.7–4.0)
Monocytes Absolute: 0.9 10*3/uL (ref 0.1–1.0)
Monocytes Relative: 10 %
Neutro Abs: 6.5 10*3/uL (ref 1.7–7.7)
Neutrophils Relative %: 71 %

## 2019-09-20 LAB — LIPID PANEL
Cholesterol: 130 mg/dL (ref 0–200)
HDL: 25 mg/dL — ABNORMAL LOW (ref >40)
LDL Cholesterol: UNDETERMINED mg/dL (ref 0–99)
Total CHOL/HDL Ratio: 5.2 RATIO
Triglycerides: 449 mg/dL — ABNORMAL HIGH (ref <150)
VLDL: UNDETERMINED mg/dL (ref 0–40)

## 2019-09-20 LAB — APTT: aPTT: 28 seconds (ref 24–36)

## 2019-09-20 LAB — CBC
HCT: 33.6 % — ABNORMAL LOW (ref 39.0–52.0)
Hemoglobin: 11.3 g/dL — ABNORMAL LOW (ref 13.0–17.0)
MCH: 30.5 pg (ref 26.0–34.0)
MCHC: 33.6 g/dL (ref 30.0–36.0)
MCV: 90.8 fL (ref 80.0–100.0)
Platelets: 302 10*3/uL (ref 150–400)
RBC: 3.7 MIL/uL — ABNORMAL LOW (ref 4.22–5.81)
RDW: 11.9 % (ref 11.5–15.5)
WBC: 9.2 10*3/uL (ref 4.0–10.5)
nRBC: 0 % (ref 0.0–0.2)

## 2019-09-20 LAB — RAPID URINE DRUG SCREEN, HOSP PERFORMED
Amphetamines: NOT DETECTED
Barbiturates: NOT DETECTED
Benzodiazepines: NOT DETECTED
Cocaine: NOT DETECTED
Opiates: NOT DETECTED
Tetrahydrocannabinol: NOT DETECTED

## 2019-09-20 LAB — COMPREHENSIVE METABOLIC PANEL
ALT: 20 U/L (ref 0–44)
AST: 19 U/L (ref 15–41)
Albumin: 3.5 g/dL (ref 3.5–5.0)
Alkaline Phosphatase: 108 U/L (ref 38–126)
Anion gap: 7 (ref 5–15)
BUN: 43 mg/dL — ABNORMAL HIGH (ref 6–20)
CO2: 24 mmol/L (ref 22–32)
Calcium: 8.5 mg/dL — ABNORMAL LOW (ref 8.9–10.3)
Chloride: 99 mmol/L (ref 98–111)
Creatinine, Ser: 2.96 mg/dL — ABNORMAL HIGH (ref 0.61–1.24)
GFR calc Af Amer: 29 mL/min — ABNORMAL LOW (ref >60)
GFR calc non Af Amer: 25 mL/min — ABNORMAL LOW (ref >60)
Glucose, Bld: 457 mg/dL — ABNORMAL HIGH (ref 70–99)
Potassium: 5 mmol/L (ref 3.5–5.1)
Sodium: 130 mmol/L — ABNORMAL LOW (ref 135–145)
Total Bilirubin: 0.6 mg/dL (ref 0.3–1.2)
Total Protein: 6.9 g/dL (ref 6.5–8.1)

## 2019-09-20 LAB — ETHANOL: Alcohol, Ethyl (B): <10 mg/dL (ref <10)

## 2019-09-20 LAB — CBG MONITORING, ED: Glucose-Capillary: 382 mg/dL — ABNORMAL HIGH (ref 70–99)

## 2019-09-20 LAB — GLUCOSE, CAPILLARY
Glucose-Capillary: 259 mg/dL — ABNORMAL HIGH (ref 70–99)
Glucose-Capillary: 166 mg/dL — ABNORMAL HIGH (ref 70–99)

## 2019-09-20 LAB — PROTIME-INR
INR: 1.1 (ref 0.8–1.2)
Prothrombin Time: 13.7 seconds (ref 11.4–15.2)

## 2019-09-20 LAB — LDL CHOLESTEROL, DIRECT: Direct LDL: 23.1 mg/dL (ref 0–99)

## 2019-09-20 MED ORDER — INSULIN ASPART 100 UNIT/ML ~~LOC~~ SOLN
12.0000 [IU] | Freq: Three times a day (TID) | SUBCUTANEOUS | Status: DC
  Administered 2019-09-20 – 2019-09-21 (×2): 12 [IU] via SUBCUTANEOUS
  Filled 2019-09-20: qty 1

## 2019-09-20 MED ORDER — LABETALOL HCL 5 MG/ML IV SOLN: 10.0000 mg | INTRAVENOUS | Status: DC | PRN

## 2019-09-20 MED ORDER — MECLIZINE HCL 12.5 MG PO TABS: 25.0000 mg | ORAL_TABLET | Freq: Three times a day (TID) | ORAL | Status: DC | PRN

## 2019-09-20 MED ORDER — ONDANSETRON HCL 4 MG/2ML IJ SOLN: 4.0000 mg | Freq: Four times a day (QID) | INTRAMUSCULAR | Status: DC | PRN

## 2019-09-20 MED ORDER — HEPARIN SODIUM (PORCINE) 5000 UNIT/ML IJ SOLN
5000.0000 [IU] | Freq: Three times a day (TID) | INTRAMUSCULAR | Status: DC
  Administered 2019-09-20 – 2019-09-21 (×3): 5000 [IU] via SUBCUTANEOUS
  Filled 2019-09-20 (×3): qty 1

## 2019-09-20 MED ORDER — ATORVASTATIN CALCIUM 40 MG PO TABS
80.0000 mg | ORAL_TABLET | Freq: Every day | ORAL | Status: DC
  Administered 2019-09-20: 18:00:00 80 mg via ORAL
  Filled 2019-09-20: qty 2

## 2019-09-20 MED ORDER — INSULIN GLARGINE 100 UNIT/ML ~~LOC~~ SOLN
12.0000 [IU] | Freq: Two times a day (BID) | SUBCUTANEOUS | Status: DC
  Administered 2019-09-20 – 2019-09-21 (×2): 12 [IU] via SUBCUTANEOUS
  Filled 2019-09-20 (×4): qty 0.12

## 2019-09-20 MED ORDER — HYDROCODONE-ACETAMINOPHEN 10-325 MG PO TABS: 1.0000 | ORAL_TABLET | Freq: Four times a day (QID) | ORAL | Status: DC | PRN

## 2019-09-20 MED ORDER — ACETAMINOPHEN 650 MG RE SUPP: 650.0000 mg | Freq: Four times a day (QID) | RECTAL | Status: DC | PRN

## 2019-09-20 MED ORDER — GABAPENTIN 100 MG PO CAPS
100.0000 mg | ORAL_CAPSULE | Freq: Every day | ORAL | Status: DC
  Administered 2019-09-20: 100 mg via ORAL
  Filled 2019-09-20: qty 1

## 2019-09-20 MED ORDER — ONDANSETRON HCL 4 MG PO TABS: 4.0000 mg | ORAL_TABLET | Freq: Four times a day (QID) | ORAL | Status: DC | PRN

## 2019-09-20 MED ORDER — ASPIRIN 81 MG PO CHEW
324.0000 mg | CHEWABLE_TABLET | Freq: Once | ORAL | Status: AC
  Administered 2019-09-20: 15:00:00 324 mg via ORAL
  Filled 2019-09-20: qty 4

## 2019-09-20 MED ORDER — ASPIRIN EC 81 MG PO TBEC
81.0000 mg | DELAYED_RELEASE_TABLET | Freq: Every day | ORAL | Status: DC
  Administered 2019-09-21: 09:00:00 81 mg via ORAL
  Filled 2019-09-20 (×2): qty 1

## 2019-09-20 MED ORDER — SODIUM CHLORIDE 0.9 % IV SOLN
INTRAVENOUS | Status: DC
  Administered 2019-09-20 – 2019-09-21 (×3): via INTRAVENOUS

## 2019-09-20 MED ORDER — METOPROLOL TARTRATE 25 MG PO TABS
25.0000 mg | ORAL_TABLET | Freq: Two times a day (BID) | ORAL | Status: DC
  Administered 2019-09-20 – 2019-09-21 (×2): 25 mg via ORAL
  Filled 2019-09-20 (×2): qty 1

## 2019-09-20 MED ORDER — AMLODIPINE BESYLATE 5 MG PO TABS
5.0000 mg | ORAL_TABLET | Freq: Every day | ORAL | Status: DC
  Administered 2019-09-20 – 2019-09-21 (×2): 5 mg via ORAL
  Filled 2019-09-20 (×2): qty 1

## 2019-09-20 MED ORDER — TICAGRELOR 90 MG PO TABS
90.0000 mg | ORAL_TABLET | Freq: Two times a day (BID) | ORAL | Status: DC
  Administered 2019-09-20 – 2019-09-21 (×2): 90 mg via ORAL
  Filled 2019-09-20 (×2): qty 1

## 2019-09-20 MED ORDER — INSULIN ASPART 100 UNIT/ML ~~LOC~~ SOLN
0.0000 [IU] | Freq: Three times a day (TID) | SUBCUTANEOUS | Status: DC
  Administered 2019-09-20: 18:00:00 8 [IU] via SUBCUTANEOUS
  Administered 2019-09-21: 09:00:00 3 [IU] via SUBCUTANEOUS

## 2019-09-20 MED ORDER — ACETAMINOPHEN 325 MG PO TABS: 650.0000 mg | ORAL_TABLET | Freq: Four times a day (QID) | ORAL | Status: DC | PRN

## 2019-09-20 MED ORDER — MECLIZINE HCL 12.5 MG PO TABS
25.0000 mg | ORAL_TABLET | Freq: Once | ORAL | Status: AC
  Administered 2019-09-20: 14:00:00 25 mg via ORAL
  Filled 2019-09-20: qty 2

## 2019-09-20 MED ORDER — NITROGLYCERIN 0.4 MG SL SUBL: 0.4000 mg | SUBLINGUAL_TABLET | SUBLINGUAL | Status: DC | PRN

## 2019-09-20 MED ORDER — INSULIN ASPART 100 UNIT/ML ~~LOC~~ SOLN: 0.0000 [IU] | Freq: Every day | SUBCUTANEOUS | Status: DC

## 2019-09-20 MED ORDER — NICOTINE 14 MG/24HR TD PT24
14.0000 mg | MEDICATED_PATCH | Freq: Every day | TRANSDERMAL | Status: DC
  Administered 2019-09-20 – 2019-09-21 (×2): 14 mg via TRANSDERMAL
  Filled 2019-09-20 (×2): qty 1
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
## 2019-09-20 NOTE — ED Triage Notes (Signed)
C/o dizziness since last night.  Says the room is spinning.  Pt is diabetic but could check glucose.  Denies any pain or SOB.
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
## 2019-09-20 NOTE — ED Notes (Signed)
ED Provider at bedside. 

## 2019-09-20 NOTE — ED Triage Notes (Signed)
Glucose 382 and pt reports not taking his BP medications.

## 2019-09-21 ENCOUNTER — Inpatient Hospital Stay (HOSPITAL_COMMUNITY): Payer: Medicare Other

## 2019-09-21 DIAGNOSIS — R42 Dizziness and giddiness: Secondary | ICD-10-CM | POA: Diagnosis not present

## 2019-09-21 DIAGNOSIS — H8110 Benign paroxysmal vertigo, unspecified ear: Secondary | ICD-10-CM | POA: Diagnosis not present

## 2019-09-21 LAB — BASIC METABOLIC PANEL
Anion gap: 8 (ref 5–15)
BUN: 36 mg/dL — ABNORMAL HIGH (ref 6–20)
CO2: 24 mmol/L (ref 22–32)
Calcium: 8.5 mg/dL — ABNORMAL LOW (ref 8.9–10.3)
Chloride: 108 mmol/L (ref 98–111)
Creatinine, Ser: 2.53 mg/dL — ABNORMAL HIGH (ref 0.61–1.24)
GFR calc Af Amer: 35 mL/min — ABNORMAL LOW (ref >60)
GFR calc non Af Amer: 30 mL/min — ABNORMAL LOW (ref >60)
Glucose, Bld: 203 mg/dL — ABNORMAL HIGH (ref 70–99)
Potassium: 4.4 mmol/L (ref 3.5–5.1)
Sodium: 140 mmol/L (ref 135–145)

## 2019-09-21 LAB — CBC
HCT: 35.6 % — ABNORMAL LOW (ref 39.0–52.0)
Hemoglobin: 11.7 g/dL — ABNORMAL LOW (ref 13.0–17.0)
MCH: 30.5 pg (ref 26.0–34.0)
MCHC: 32.9 g/dL (ref 30.0–36.0)
MCV: 93 fL (ref 80.0–100.0)
Platelets: 314 10*3/uL (ref 150–400)
RBC: 3.83 MIL/uL — ABNORMAL LOW (ref 4.22–5.81)
RDW: 12.2 % (ref 11.5–15.5)
WBC: 10.7 10*3/uL — ABNORMAL HIGH (ref 4.0–10.5)
nRBC: 0 % (ref 0.0–0.2)

## 2019-09-21 LAB — GLUCOSE, CAPILLARY: Glucose-Capillary: 200 mg/dL — ABNORMAL HIGH (ref 70–99)

## 2019-09-21 LAB — MAGNESIUM: Magnesium: 2.4 mg/dL (ref 1.7–2.4)

## 2019-09-21 LAB — SARS CORONAVIRUS 2 (TAT 6-24 HRS): SARS Coronavirus 2: NEGATIVE

## 2019-09-21 MED ORDER — MECLIZINE HCL 25 MG PO TABS: 25.0000 mg | ORAL_TABLET | Freq: Three times a day (TID) | ORAL | 0 refills | Status: AC | PRN

## 2019-09-21 NOTE — Discharge Summary (Signed)
Physician Discharge Summary  Derrick Mosley [id number] DOB: 05/29/1976 DOA: 09/20/2019  PCP: [facility]  Admit date: 09/20/2019  Discharge date: 09/21/2019  Admitted From:Home  Disposition:  Home  Recommendations for Outpatient Follow-up:  1. Follow up with PCP in 1-2 weeks and consider follow-up with endocrinologist to help with blood glucose control 2. Please obtain repeat BMP in 1 week to reassess creatinine levels 3. Hold home Lasix and losartan until further follow-up with PCP, but continue on amlodipine and metoprolol 4. Meclizine prescribed as needed for any dizziness as patient appears to have BPPV  Home Health: None  Equipment/Devices: None  Discharge Condition: Stable  CODE STATUS: Full  Diet recommendation: Heart Healthy/carb modified  Brief/Interim Summary: Per HPI: Derrick Mosley is a 43 y.o. 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  He started to feel the room spinning when he rolled over in bed and has had persistent symptoms.  He has alleviation of symptoms with rest and as he starts moving or turning his head he starts to feel much worse.  Prior to this, he has noted some blurriness with his vision in the last 2 days, but denies any focal weakness or difficulty with coordination.  He also feels off balance with ambulation, but states that this is not unusual for him as he has significant neuropathy.  He denies any headaches or trouble with his speech or swallowing.  He states that he has been off of his blood pressure medications in the past month as his physicians have told him to do so.  11/23: Patient was admitted with acute dizziness with questionable peripheral versus central vertigo.  He did  have brain MRI today which was noted to be negative for any findings of CVA.  His fasting lipid profile demonstrates elevated triglycerides and he will remain on his usual home statin for now.  Continue home antiplatelet regimen.  His blood pressures are better controlled and his AKI is improving with creatinine currently 2.53.  He has been encouraged to remain off of his losartan and Lasix for now until further follow-up to his primary care physician with repeat BMP in 1 week.  He does have good blood pressure control with his other 2 medications of amlodipine and metoprolol.  His blood glucose is also better controlled this morning.  Recent A1c shows values greater than 14% on 06/2019 and I would recommend follow-up with endocrinology outpatient.  No other acute events noted throughout the course of this admission and patient is otherwise asymptomatic.  Discharge Diagnoses:  Active Problems:   Vertigo  Principal discharge diagnosis: BPPV.  Discharge Instructions  Discharge Instructions    Diet - low sodium heart healthy   Complete by: As directed    Increase activity slowly   Complete by: As directed      Allergies as of 09/21/2019   No Known Allergies     Medication List    STOP taking these medications   furosemide 20 MG tablet Commonly known as: LASIX   losartan 25 MG tablet Commonly known as: COZAAR   nitroGLYCERIN 0.4 MG SL tablet Commonly known as: NITROSTAT     TAKE these medications   Accu-Chek FastClix Lancets Misc Use 3 times a day   amLODipine 5 MG tablet  Commonly known as: NORVASC Take 5 mg by mouth daily. What changed: Another medication with the same name was removed. Continue taking this medication, and follow the directions you see here.   aspirin 81 MG EC tablet Take 1 tablet (81 mg total) by mouth daily.   atorvastatin 80 MG tablet Commonly known as: LIPITOR Take 1 tablet (80 mg total) by mouth daily at 6 PM.   benzonatate 200 MG capsule Commonly  known as: TESSALON benzonatate 200 mg capsule  TAKE 1 CAPSULE BY MOUTH THREE TIMES DAILY AS NEEDED FOR COUGH SWALLOW WHOLE DO NOT CHEW   Bydureon 2 MG Pen Generic drug: Exenatide ER Inject 2 mg into the skin once a week.   gabapentin 100 MG capsule Commonly known as: NEURONTIN Take 1 capsule (100 mg total) by mouth at bedtime.   glucose blood test strip Commonly known as: Accu-Chek Guide Use 3 times a day   HYDROcodone-acetaminophen 10-325 MG tablet Commonly known as: NORCO Take 1 tablet by mouth 2 (two) times daily as needed.   insulin aspart 100 UNIT/ML FlexPen Commonly known as: NovoLOG FlexPen Inject 12-16 Units into the skin 3 (three) times daily before meals.   Insulin Glargine 100 UNIT/ML Solostar Pen Commonly known as: Lantus SoloStar Inject 25 Units into the skin 2 (two) times daily.   Insulin Pen Needle 31G X 8 MM Misc Commonly known as: B-D ULTRAFINE III SHORT PEN 1 each by Does not apply route as directed.   INSULIN SYRINGE 1CC/30GX1/2" 30G X 1/2" 1 ML Misc 1 Device by Does not apply route 2 (two) times daily before a meal.   meclizine 25 MG tablet Commonly known as: ANTIVERT Take 1 tablet (25 mg total) by mouth 3 (three) times daily as needed for dizziness.   methocarbamol 500 MG tablet Commonly known as: ROBAXIN methocarbamol 500 mg tablet  Take 1 tablet 3 times a day by oral route as needed.   metoprolol tartrate 25 MG tablet Commonly known as: LOPRESSOR Take 1 tablet (25 mg total) by mouth 2 (two) times daily.   Nicoderm CQ 14 mg/24hr patch Generic drug: nicotine Nicoderm CQ   nystatin powder Commonly known as: MYCOSTATIN/NYSTOP Apply topically 2 (two) times daily.   nystatin-triamcinolone ointment Commonly known as: MYCOLOG Apply between 4th and 5th toe left foot once daily   promethazine 25 MG tablet Commonly known as: PHENERGAN promethazine 25 mg tablet  TAKE 1 TABLET BY MOUTH EVERY 6 HOURS AS NEEDED   ticagrelor 90 MG Tabs  tablet Commonly known as: BRILINTA Take 1 tablet (90 mg total) by mouth 2 (two) times daily.      Follow-up Information    [facility] Follow up in 1 week(s).   Contact information: [place] [id number] [phone]          No Known Allergies  Consultations:  None   Procedures/Studies: Ct Head Wo Contrast  Result Date: 09/20/2019 CLINICAL DATA:  Dizziness EXAM: CT HEAD WITHOUT CONTRAST TECHNIQUE: Contiguous axial images were obtained from the base of the skull through the vertex without intravenous contrast. COMPARISON:  10/01/2016 FINDINGS: Brain: No evidence of acute infarction, hemorrhage, hydrocephalus, extra-axial collection or mass lesion/mass effect. Vascular: No hyperdense vessel or unexpected calcification. Skull: Normal. Negative for fracture or focal lesion. Sinuses/Orbits: No acute finding. Other: None. IMPRESSION: No acute intracranial abnormality. Electronically Signed   By: Davina Poke M.D.   On: 09/20/2019 12:50   Mr Brain Wo Contrast  Result Date: 09/21/2019 CLINICAL DATA:  Dizziness EXAM: MRI HEAD WITHOUT CONTRAST TECHNIQUE: Multiplanar, multiecho pulse sequences of the brain and surrounding structures were obtained without intravenous contrast. COMPARISON:  None. FINDINGS: Brain: There is no acute infarction or intracranial hemorrhage. There is no intracranial mass, mass effect, or edema. There is no hydrocephalus or extra-axial fluid collection. Vascular: Major vessel flow voids at the skull base are preserved. Skull and upper cervical spine: Normal marrow signal is preserved. Sinuses/Orbits: Trace paranasal sinus mucosal thickening. Orbits are unremarkable. Other: Sella is unremarkable.  Mastoid air cells are clear. IMPRESSION: No evidence of recent infarction, intracranial hemorrhage, or mass. Electronically Signed   By: Macy Mis M.D.   On: 09/21/2019 08:58     Discharge Exam: Vitals:   09/20/19 2050 09/21/19 0400  BP:  125/81 112/76  Pulse: 87 72  Resp: 16 18  Temp: 97.7 F (36.5 C) 98.7 F (37.1 C)  SpO2: 98% 98%   Vitals:   09/20/19 1630 09/20/19 1711 09/20/19 2050 09/21/19 0400  BP: (!) 150/92 122/80 125/81 112/76  Pulse: 89 91 87 72  Resp: 17 16 16 18   Temp:  98.6 F (37 C) 97.7 F (36.5 C) 98.7 F (37.1 C)  TempSrc:  Oral Oral   SpO2: 98% 98% 98% 98%  Weight:      Height:        General: Pt is alert, awake, not in acute distress Cardiovascular: RRR, S1/S2 +, no rubs, no gallops Respiratory: CTA bilaterally, no wheezing, no rhonchi Abdominal: Soft, NT, ND, bowel sounds + Extremities: no edema, no cyanosis    The results of significant diagnostics from this hospitalization (including imaging, microbiology, ancillary and laboratory) are listed below for reference.     Microbiology: Recent Results (from the past 240 hour(s))  SARS CORONAVIRUS 2 (TAT 6-24 HRS) Nasopharyngeal Nasopharyngeal Swab     Status: None   Collection Time: 09/20/19  3:06 PM   Specimen: Nasopharyngeal Swab  Result Value Ref Range Status   SARS Coronavirus 2 NEGATIVE NEGATIVE Final    Comment: (NOTE) SARS-CoV-2 target nucleic acids are NOT DETECTED. The SARS-CoV-2 RNA is generally detectable in upper and lower respiratory specimens during the acute phase of infection. Negative results do not preclude SARS-CoV-2 infection, do not rule out co-infections with other pathogens, and should not be used as the sole basis for treatment or other patient management decisions. Negative results must be combined with clinical observations, patient history, and epidemiological information. The expected result is Negative. Fact Sheet for Patients: SugarRoll.be Fact Sheet for Healthcare Providers: https://www.woods-mathews.com/ This test is not yet approved or cleared by the [place] FDA and  has been authorized for detection and/or diagnosis of SARS-CoV-2 by FDA under an  Emergency Use Authorization (EUA). This EUA will remain  in effect (meaning this test can be used) for the duration of the COVID-19 declaration under Section 56 4(b)(1) of the Act, 21 U.S.C. section 360bbb-3(b)(1), unless the authorization is terminated or revoked sooner. Performed at [facility], [place] [address] [place], [place] [address]      Labs: BNP (last 3 results) Recent Labs    03/15/19 1755  BNP [id number]*   Basic Metabolic Panel: Recent Labs  Lab 09/20/19 1130 09/21/19 0605  NA 130* 140  K 5.0 4.4  CL 99 108  CO2 24 24  GLUCOSE 457* 203*  BUN 43* 36*  CREATININE 2.96* 2.53*  CALCIUM 8.5* 8.5*  MG  --  2.4   Liver Function Tests: Recent Labs  Lab 09/20/19 1130  AST 19  ALT 20  ALKPHOS 108  BILITOT 0.6  PROT 6.9  ALBUMIN 3.5   No results for input(s): LIPASE, AMYLASE in the last 168 hours. No results for input(s): AMMONIA in the last 168 hours. CBC: Recent Labs  Lab 09/20/19 1130 09/21/19 0605  WBC 9.2 10.7*  NEUTROABS 6.5  --   HGB 11.3* 11.7*  HCT 33.6* 35.6*  MCV 90.8 93.0  PLT 302 314   Cardiac Enzymes: No results for input(s): CKTOTAL, CKMB, CKMBINDEX, TROPONINI in the last 168 hours. BNP: Invalid input(s): POCBNP CBG: Recent Labs  Lab 09/20/19 1110 09/20/19 1714 09/20/19 2157 09/21/19 0805  GLUCAP 382* 259* 166* 200*   D-Dimer No results for input(s): DDIMER in the last 72 hours. Hgb A1c No results for input(s): HGBA1C in the last 72 hours. Lipid Profile Recent Labs    09/20/19 1146  CHOL 130  HDL 25*  LDLCALC UNABLE TO CALCULATE IF TRIGLYCERIDE OVER 400 mg/dL  TRIG 449*  CHOLHDL 5.2  LDLDIRECT 23.1   Thyroid function studies No results for input(s): TSH, T4TOTAL, T3FREE, THYROIDAB in the last 72 hours.  Invalid input(s): FREET3 Anemia work up No results for input(s): VITAMINB12, FOLATE, FERRITIN, TIBC, IRON, RETICCTPCT in the last 72 hours. Urinalysis    Component Value Date/Time   COLORURINE STRAW (A)  01/29/2018 1435   APPEARANCEUR CLEAR 01/29/2018 1435   LABSPEC 1.006 01/29/2018 1435   PHURINE 7.0 01/29/2018 1435   GLUCOSEU 50 (A) 01/29/2018 1435   HGBUR SMALL (A) 01/29/2018 1435   BILIRUBINUR NEGATIVE 01/29/2018 1435   KETONESUR NEGATIVE 01/29/2018 1435   PROTEINUR 100 (A) 01/29/2018 1435   UROBILINOGEN 0.2 04/06/2015 1103   NITRITE NEGATIVE 01/29/2018 1435   LEUKOCYTESUR NEGATIVE 01/29/2018 1435   Sepsis Labs Invalid input(s): PROCALCITONIN,  WBC,  LACTICIDVEN Microbiology Recent Results (from the past 240 hour(s))  SARS CORONAVIRUS 2 (TAT 6-24 HRS) Nasopharyngeal Nasopharyngeal Swab     Status: None   Collection Time: 09/20/19  3:06 PM   Specimen: Nasopharyngeal Swab  Result Value Ref Range Status   SARS Coronavirus 2 NEGATIVE NEGATIVE Final    Comment: (NOTE) SARS-CoV-2 target nucleic acids are NOT DETECTED. The SARS-CoV-2 RNA is generally detectable in upper and lower respiratory specimens during the acute phase of infection. Negative results do not preclude SARS-CoV-2 infection, do not rule out co-infections with other pathogens, and should not be used as the sole basis for treatment or other patient management decisions. Negative results must be combined with clinical observations, patient history, and epidemiological information. The expected result is Negative. Fact Sheet for Patients: SugarRoll.be Fact Sheet for Healthcare Providers: https://www.woods-mathews.com/ This test is not yet approved or cleared by the [place] FDA and  has been authorized for detection and/or diagnosis of SARS-CoV-2 by FDA under an Emergency Use Authorization (EUA). This EUA will remain  in effect (meaning this test can be used) for the duration of the COVID-19 declaration under Section 56 4(b)(1) of the Act, 21 U.S.C. section 360bbb-3(b)(1), unless the authorization is terminated or revoked sooner. Performed at [facility],  [place] [address], [place],  [address]      Time coordinating discharge: 35 minutes  SIGNED:   Rodena Goldmann, DO [facility] 09/21/2019, 10:29 AM  If 7PM-7AM, please contact night-coverage [email or web address]

## 2019-09-21 NOTE — Care Management CC44 (Signed)
Condition Code 44 Documentation Completed  Patient Details  Name: Derrick Mosley MRN: [id number] Date of Birth: [birth year]   Condition Code 44 given:  Yes Patient signature on Condition Code 44 notice:  Yes Documentation of 2 MD's agreement:  Yes Code 44 added to claim:  Yes    Darien Mignogna, Chauncey Reading, RN 09/21/2019, 10:12 AM

## 2019-09-21 NOTE — Evaluation (Signed)
Physical Therapy Evaluation Patient Details Name: Derrick Mosley MRN: [id number] DOB: [birth year] Today's Date: 09/21/2019   History of Present Illness  Derrick Mosley is a 43 y.o. 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  He started to feel the room spinning when he rolled over in bed and has had persistent symptoms.  He has alleviation of symptoms with rest and as he starts moving or turning his head he starts to feel much worse.  Prior to this, he has noted some blurriness with his vision in the last 2 days, but denies any focal weakness or difficulty with coordination.  He also feels off balance with ambulation, but states that this is not unusual for him as he has significant neuropathy.  He denies any headaches or trouble with his speech or swallowing.  He states that he has been off of his blood pressure medications in the past month as his physicians have told him to do so.    Clinical Impression  Patient functioning at baseline for functional mobility and gait.  Plan:  Patient discharged from physical therapy to care of nursing for ambulation daily as tolerated for length of stay.     Follow Up Recommendations No PT follow up    Equipment Recommendations  None recommended by PT    Recommendations for Other Services       Precautions / Restrictions Precautions Precautions: None Restrictions Weight Bearing Restrictions: No      Mobility  Bed Mobility Overal bed mobility: Independent                Transfers Overall transfer level: Modified independent               General transfer comment: increased time  Ambulation/Gait Ambulation/Gait assistance: Modified independent (Device/Increase time) Gait Distance  (Feet): 200 Feet Assistive device: None Gait Pattern/deviations: WFL(Within Functional Limits) Gait velocity: decreased   General Gait Details: slightly labored cadence without loss of balance  Stairs            Wheelchair Mobility    Modified Rankin (Stroke Patients Only)       Balance Overall balance assessment: No apparent balance deficits (not formally assessed)                                           Pertinent Vitals/Pain Pain Assessment: 0-10 Pain Score: 8  Pain Location: chronic low back Pain Descriptors / Indicators: Aching Pain Intervention(s): Limited activity within patient's tolerance;Monitored during session    Home Living Family/patient expects to be discharged to:: Private residence Living Arrangements: Children Available Help at Discharge: Family Type of Home: House Home Access: Stairs to enter Entrance Stairs-Rails: Right;Left;Can reach both Technical brewer of Steps: 3 Home Layout: One level Home Equipment: Cane - single point      Prior Function Level of Independence: Independent with assistive device(s)         Comments: Hydrographic surveyor with SPC PRN, drives     Hand Dominance   Dominant Hand: Left    Extremity/Trunk Assessment   Upper Extremity Assessment Upper Extremity Assessment: Overall WFL for tasks assessed    Lower Extremity Assessment Lower  Extremity Assessment: Overall WFL for tasks assessed    Cervical / Trunk Assessment Cervical / Trunk Assessment: Normal  Communication   Communication: No difficulties  Cognition Arousal/Alertness: Awake/alert Behavior During Therapy: WFL for tasks assessed/performed Overall Cognitive Status: Within Functional Limits for tasks assessed                                        General Comments      Exercises     Assessment/Plan    PT Assessment Patent does not need any further PT services  PT Problem List         PT  Treatment Interventions      PT Goals (Current goals can be found in the Care Plan section)  Acute Rehab PT Goals Patient Stated Goal: return home with family to assist PT Goal Formulation: With patient Time For Goal Achievement: 09/21/19 Potential to Achieve Goals: Good    Frequency     Barriers to discharge        Co-evaluation               AM-PAC PT "6 Clicks" Mobility  Outcome Measure Help needed turning from your back to your side while in a flat bed without using bedrails?: None Help needed moving from lying on your back to sitting on the side of a flat bed without using bedrails?: None Help needed moving to and from a bed to a chair (including a wheelchair)?: None Help needed standing up from a chair using your arms (e.g., wheelchair or bedside chair)?: None Help needed to walk in hospital room?: None Help needed climbing 3-5 steps with a railing? : None 6 Click Score: 24    End of Session   Activity Tolerance: Patient tolerated treatment well Patient left: in bed(seated at bedside) Nurse Communication: Mobility status PT Visit Diagnosis: Unsteadiness on feet (R26.81);Other abnormalities of gait and mobility (R26.89);Muscle weakness (generalized) (M62.81)    Time: [id number] PT Time Calculation (min) (ACUTE ONLY): 15 min   Charges:   PT Evaluation $PT Eval Low Complexity: 1 Low PT Treatments $Therapeutic Activity: 8-22 mins        11:04 AM, 09/21/19 Lonell Grandchild, MPT Physical Therapist with [facility] [phone] office [phone] mobile phone

## 2019-09-21 NOTE — Care Management Obs Status (Signed)
[place] NOTIFICATION   Patient Details  Name: Derrick Mosley MRN: [id number] Date of Birth: 1976-02-19   Medicare Observation Status Notification Given:  Yes    Takila Kronberg, Chauncey Reading, RN 09/21/2019, 10:12 AM

## 2019-09-21 NOTE — Progress Notes (Signed)
Nsg Discharge Note  Admit Date:  09/20/2019 Discharge date: 09/21/2019   [name] to be D/C'd Home per MD order.  AVS completed.  Copy for chart, and copy for patient signed, and dated. Patient/caregiver able to verbalize understanding.  Discharge Medication: Allergies as of 09/21/2019   No Known Allergies     Medication List    STOP taking these medications   furosemide 20 MG tablet Commonly known as: LASIX   losartan 25 MG tablet Commonly known as: COZAAR   nitroGLYCERIN 0.4 MG SL tablet Commonly known as: NITROSTAT     TAKE these medications   Accu-Chek FastClix Lancets Misc Use 3 times a day   amLODipine 5 MG tablet Commonly known as: NORVASC Take 5 mg by mouth daily. What changed: Another medication with the same name was removed. Continue taking this medication, and follow the directions you see here.   aspirin 81 MG EC tablet Take 1 tablet (81 mg total) by mouth daily.   atorvastatin 80 MG tablet Commonly known as: LIPITOR Take 1 tablet (80 mg total) by mouth daily at 6 PM.   benzonatate 200 MG capsule Commonly known as: TESSALON benzonatate 200 mg capsule  TAKE 1 CAPSULE BY MOUTH THREE TIMES DAILY AS NEEDED FOR COUGH SWALLOW WHOLE DO NOT CHEW   Bydureon 2 MG Pen Generic drug: Exenatide ER Inject 2 mg into the skin once a week.   gabapentin 100 MG capsule Commonly known as: NEURONTIN Take 1 capsule (100 mg total) by mouth at bedtime.   glucose blood test strip Commonly known as: Accu-Chek Guide Use 3 times a day   HYDROcodone-acetaminophen 10-325 MG tablet Commonly known as: NORCO Take 1 tablet by mouth 2 (two) times daily as needed.   insulin aspart 100 UNIT/ML FlexPen Commonly known as: NovoLOG FlexPen Inject 12-16 Units into the skin 3 (three) times daily before meals.   Insulin Glargine 100 UNIT/ML Solostar Pen Commonly known as: Lantus SoloStar Inject 25 Units into the skin 2 (two) times daily.   Insulin Pen Needle 31G X 8 MM  Misc Commonly known as: B-D ULTRAFINE III SHORT PEN 1 each by Does not apply route as directed.   INSULIN SYRINGE 1CC/30GX1/2" 30G X 1/2" 1 ML Misc 1 Device by Does not apply route 2 (two) times daily before a meal.   meclizine 25 MG tablet Commonly known as: ANTIVERT Take 1 tablet (25 mg total) by mouth 3 (three) times daily as needed for dizziness.   methocarbamol 500 MG tablet Commonly known as: ROBAXIN methocarbamol 500 mg tablet  Take 1 tablet 3 times a day by oral route as needed.   metoprolol tartrate 25 MG tablet Commonly known as: LOPRESSOR Take 1 tablet (25 mg total) by mouth 2 (two) times daily.   Nicoderm CQ 14 mg/24hr patch Generic drug: nicotine Nicoderm CQ   nystatin powder Commonly known as: MYCOSTATIN/NYSTOP Apply topically 2 (two) times daily.   nystatin-triamcinolone ointment Commonly known as: MYCOLOG Apply between 4th and 5th toe left foot once daily   promethazine 25 MG tablet Commonly known as: PHENERGAN promethazine 25 mg tablet  TAKE 1 TABLET BY MOUTH EVERY 6 HOURS AS NEEDED   ticagrelor 90 MG Tabs tablet Commonly known as: BRILINTA Take 1 tablet (90 mg total) by mouth 2 (two) times daily.       Discharge Assessment: Vitals:   09/20/19 2050 09/21/19 0400  BP: 125/81 112/76  Pulse: 87 72  Resp: 16 18  Temp: 97.7 F (36.5 C)  98.7 F (37.1 C)  SpO2: 98% 98%   Skin clean, dry and intact without evidence of skin break down, no evidence of skin tears noted. IV catheter discontinued intact. Site without signs and symptoms of complications - no redness or edema noted at insertion site, patient denies c/o pain - only slight tenderness at site.  Dressing with slight pressure applied.  D/c Instructions-Education: Discharge instructions given to patient/family with verbalized understanding. D/c education completed with patient/family including follow up instructions, medication list, d/c activities limitations if indicated, with other d/c  instructions as indicated by MD - patient able to verbalize understanding, all questions fully answered. Patient instructed to return to ED, call 911, or call MD for any changes in condition.  Patient escorted via [place], and D/C home via private auto.  Loa Socks, RN 09/21/2019 10:31 AM

## 2019-09-23 DIAGNOSIS — E1159 Type 2 diabetes mellitus with other circulatory complications: Secondary | ICD-10-CM | POA: Diagnosis not present

## 2019-09-23 DIAGNOSIS — I2510 Atherosclerotic heart disease of native coronary artery without angina pectoris: Secondary | ICD-10-CM | POA: Diagnosis not present

## 2019-09-23 DIAGNOSIS — E1129 Type 2 diabetes mellitus with other diabetic kidney complication: Secondary | ICD-10-CM | POA: Diagnosis not present

## 2019-09-23 DIAGNOSIS — N183 Chronic kidney disease, stage 3 unspecified: Secondary | ICD-10-CM | POA: Diagnosis not present

## 2019-09-23 DIAGNOSIS — I739 Peripheral vascular disease, unspecified: Secondary | ICD-10-CM | POA: Diagnosis not present

## 2019-09-23 DIAGNOSIS — Z6831 Body mass index (BMI) 31.0-31.9, adult: Secondary | ICD-10-CM | POA: Diagnosis not present

## 2019-09-23 DIAGNOSIS — Z1389 Encounter for screening for other disorder: Secondary | ICD-10-CM | POA: Diagnosis not present

## 2019-09-28 DIAGNOSIS — N179 Acute kidney failure, unspecified: Secondary | ICD-10-CM | POA: Diagnosis not present

## 2019-09-28 DIAGNOSIS — T508X5A Adverse effect of diagnostic agents, initial encounter: Secondary | ICD-10-CM | POA: Diagnosis not present

## 2019-09-28 DIAGNOSIS — E1122 Type 2 diabetes mellitus with diabetic chronic kidney disease: Secondary | ICD-10-CM | POA: Diagnosis not present

## 2019-09-28 DIAGNOSIS — N141 Nephropathy induced by other drugs, medicaments and biological substances: Secondary | ICD-10-CM | POA: Diagnosis not present

## 2019-09-28 DIAGNOSIS — I129 Hypertensive chronic kidney disease with stage 1 through stage 4 chronic kidney disease, or unspecified chronic kidney disease: Secondary | ICD-10-CM | POA: Diagnosis not present

## 2019-09-28 DIAGNOSIS — N2581 Secondary hyperparathyroidism of renal origin: Secondary | ICD-10-CM | POA: Diagnosis not present

## 2019-09-28 DIAGNOSIS — Z1159 Encounter for screening for other viral diseases: Secondary | ICD-10-CM | POA: Diagnosis not present

## 2019-09-28 DIAGNOSIS — E1140 Type 2 diabetes mellitus with diabetic neuropathy, unspecified: Secondary | ICD-10-CM | POA: Diagnosis not present

## 2019-09-28 DIAGNOSIS — D631 Anemia in chronic kidney disease: Secondary | ICD-10-CM | POA: Diagnosis not present

## 2019-09-28 DIAGNOSIS — I2510 Atherosclerotic heart disease of native coronary artery without angina pectoris: Secondary | ICD-10-CM | POA: Diagnosis not present

## 2019-09-28 DIAGNOSIS — I739 Peripheral vascular disease, unspecified: Secondary | ICD-10-CM | POA: Diagnosis not present

## 2019-09-28 DIAGNOSIS — N183 Chronic kidney disease, stage 3 unspecified: Secondary | ICD-10-CM | POA: Diagnosis not present

## 2019-10-01 DIAGNOSIS — E875 Hyperkalemia: Secondary | ICD-10-CM | POA: Diagnosis not present

## 2019-10-26 ENCOUNTER — Other Ambulatory Visit: Payer: Self-pay

## 2019-10-27 ENCOUNTER — Encounter: Payer: Self-pay | Admitting: Internal Medicine

## 2019-10-27 ENCOUNTER — Ambulatory Visit (INDEPENDENT_AMBULATORY_CARE_PROVIDER_SITE_OTHER): Payer: Medicare Other | Admitting: Internal Medicine

## 2019-10-27 VITALS — BP 130/80 | HR 79 | Ht 71.0 in | Wt 210.0 lb

## 2019-10-27 DIAGNOSIS — E669 Obesity, unspecified: Secondary | ICD-10-CM

## 2019-10-27 DIAGNOSIS — E010 Iodine-deficiency related diffuse (endemic) goiter: Secondary | ICD-10-CM

## 2019-10-27 DIAGNOSIS — E1165 Type 2 diabetes mellitus with hyperglycemia: Secondary | ICD-10-CM | POA: Diagnosis not present

## 2019-10-27 DIAGNOSIS — E1159 Type 2 diabetes mellitus with other circulatory complications: Secondary | ICD-10-CM | POA: Diagnosis not present

## 2019-10-27 DIAGNOSIS — E782 Mixed hyperlipidemia: Secondary | ICD-10-CM | POA: Diagnosis not present

## 2019-10-27 MED ORDER — FREESTYLE LIBRE 14 DAY SENSOR MISC: 1.0000 | 11 refills | Status: DC

## 2019-10-27 MED ORDER — NOVOLOG FLEXPEN 100 UNIT/ML ~~LOC~~ SOPN: 8.0000 [IU] | PEN_INJECTOR | Freq: Three times a day (TID) | SUBCUTANEOUS | 3 refills | Status: DC

## 2019-10-27 MED ORDER — FREESTYLE LIBRE 14 DAY READER DEVI: 1.0000 | Freq: Once | 0 refills | Status: AC

## 2019-10-27 MED ORDER — INSULIN PEN NEEDLE 32G X 4 MM MISC: 3 refills | Status: AC

## 2019-10-27 MED ORDER — TRESIBA FLEXTOUCH 200 UNIT/ML ~~LOC~~ SOPN: 40.0000 [IU] | PEN_INJECTOR | Freq: Every day | SUBCUTANEOUS | 3 refills | Status: DC

## 2019-10-27 MED ORDER — OZEMPIC (0.25 OR 0.5 MG/DOSE) 2 MG/1.5ML ~~LOC~~ SOPN: 0.5000 mg | PEN_INJECTOR | SUBCUTANEOUS | 5 refills | Status: DC

## 2019-10-27 NOTE — Patient Instructions (Signed)
Please start: - Tresiba 20 units daily x 1 day, then increase to 30 units x 1 day, then 40 units daily - NovoLog 8-16 units 3 a day before meals (~15 min before meals) - Ozempic 0.25 mg weekly in a.m. (for example on Sunday morning) x 4 weeks, then increase to 0.5 mg weekly in a.m. if no nausea or hypoglycemia.  Please return in 1.5 months with your sugar log.
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
## 2019-10-28 ENCOUNTER — Telehealth: Payer: Self-pay

## 2019-10-28 NOTE — Telephone Encounter (Signed)
Yes, I did advise him about this and he told me that he is planning to buy out-of-pocket for it.

## 2019-10-28 NOTE — Telephone Encounter (Signed)
RX for CGM has been rejected by insurance due to patient not meeting requirements of documented evidence of finger testing 4 times a day.

## 2019-10-28 NOTE — Telephone Encounter (Signed)
Noted  

## 2019-11-03 ENCOUNTER — Ambulatory Visit
Admission: RE | Admit: 2019-11-03 | Discharge: 2019-11-03 | Disposition: A | Discharge status: Home / Self Care | Payer: Medicare Other | Source: Ambulatory Visit | Attending: Internal Medicine | Admitting: Internal Medicine

## 2019-11-03 DIAGNOSIS — E042 Nontoxic multinodular goiter: Secondary | ICD-10-CM | POA: Diagnosis not present

## 2019-11-03 DIAGNOSIS — E010 Iodine-deficiency related diffuse (endemic) goiter: Secondary | ICD-10-CM

## 2019-11-04 ENCOUNTER — Telehealth: Payer: Self-pay

## 2019-11-04 NOTE — Telephone Encounter (Signed)
-----   Message from Philemon Kingdom, MD sent at 11/03/2019  5:12 PM EST ----- Lenna Sciara, can you please call pt: Good news: The thyroid ultrasound report is back and this not show any nodules.  It does appear that one of his thyroid lobe is slightly larger, but this is of no consequence.

## 2019-11-04 NOTE — Telephone Encounter (Signed)
Notified patient of message from Dr. Gherghe, patient expressed understanding and agreement. No further questions.  

## 2019-11-06 DIAGNOSIS — E1151 Type 2 diabetes mellitus with diabetic peripheral angiopathy without gangrene: Secondary | ICD-10-CM | POA: Diagnosis not present

## 2019-11-06 DIAGNOSIS — D649 Anemia, unspecified: Secondary | ICD-10-CM | POA: Diagnosis not present

## 2019-11-06 DIAGNOSIS — E785 Hyperlipidemia, unspecified: Secondary | ICD-10-CM | POA: Diagnosis not present

## 2019-11-06 DIAGNOSIS — I25118 Atherosclerotic heart disease of native coronary artery with other forms of angina pectoris: Secondary | ICD-10-CM | POA: Diagnosis not present

## 2019-11-06 DIAGNOSIS — N189 Chronic kidney disease, unspecified: Secondary | ICD-10-CM | POA: Diagnosis not present

## 2019-11-06 DIAGNOSIS — F17290 Nicotine dependence, other tobacco product, uncomplicated: Secondary | ICD-10-CM | POA: Diagnosis not present

## 2019-11-06 DIAGNOSIS — I10 Essential (primary) hypertension: Secondary | ICD-10-CM | POA: Diagnosis not present

## 2019-11-09 ENCOUNTER — Ambulatory Visit (INDEPENDENT_AMBULATORY_CARE_PROVIDER_SITE_OTHER): Payer: Medicare Other | Admitting: Podiatry

## 2019-11-09 ENCOUNTER — Other Ambulatory Visit: Payer: Self-pay

## 2019-11-09 ENCOUNTER — Encounter: Payer: Self-pay | Admitting: Podiatry

## 2019-11-09 DIAGNOSIS — E1142 Type 2 diabetes mellitus with diabetic polyneuropathy: Secondary | ICD-10-CM

## 2019-11-09 DIAGNOSIS — B351 Tinea unguium: Secondary | ICD-10-CM

## 2019-11-09 DIAGNOSIS — Z89431 Acquired absence of right foot: Secondary | ICD-10-CM

## 2019-11-09 DIAGNOSIS — L84 Corns and callosities: Secondary | ICD-10-CM | POA: Diagnosis not present

## 2019-11-09 NOTE — Patient Instructions (Signed)
Diabetes Mellitus and Foot Care Foot care is an important part of your health, especially when you have diabetes. Diabetes may cause you to have problems because of poor blood flow (circulation) to your feet and legs, which can cause your skin to:  Become thinner and drier.  Break more easily.  Heal more slowly.  Peel and crack. You may also have nerve damage (neuropathy) in your legs and feet, causing decreased feeling in them. This means that you may not notice minor injuries to your feet that could lead to more serious problems. Noticing and addressing any potential problems early is the best way to prevent future foot problems. How to care for your feet Foot hygiene  Wash your feet daily with warm water and mild soap. Do not use hot water. Then, pat your feet and the areas between your toes until they are completely dry. Do not soak your feet as this can dry your skin.  Trim your toenails straight across. Do not dig under them or around the cuticle. File the edges of your nails with an emery board or nail file.  Apply a moisturizing lotion or petroleum jelly to the skin on your feet and to dry, brittle toenails. Use lotion that does not contain alcohol and is unscented. Do not apply lotion between your toes. Shoes and socks  Wear clean socks or stockings every day. Make sure they are not too tight. Do not wear knee-high stockings since they may decrease blood flow to your legs.  Wear shoes that fit properly and have enough cushioning. Always look in your shoes before you put them on to be sure there are no objects inside.  To break in new shoes, wear them for just a few hours a day. This prevents injuries on your feet. Wounds, scrapes, corns, and calluses  Check your feet daily for blisters, cuts, bruises, sores, and redness. If you cannot see the bottom of your feet, use a mirror or ask someone for help.  Do not cut corns or calluses or try to remove them with medicine.  If you  find a minor scrape, cut, or break in the skin on your feet, keep it and the skin around it clean and dry. You may clean these areas with mild soap and water. Do not clean the area with peroxide, alcohol, or iodine.  If you have a wound, scrape, corn, or callus on your foot, look at it several times a day to make sure it is healing and not infected. Check for: ? Redness, swelling, or pain. ? Fluid or blood. ? Warmth. ? Pus or a bad smell. General instructions  Do not cross your legs. This may decrease blood flow to your feet.  Do not use heating pads or hot water bottles on your feet. They may burn your skin. If you have lost feeling in your feet or legs, you may not know this is happening until it is too late.  Protect your feet from hot and cold by wearing shoes, such as at the beach or on hot pavement.  Schedule a complete foot exam at least once a year (annually) or more often if you have foot problems. If you have foot problems, report any cuts, sores, or bruises to your health care provider immediately. Contact a health care provider if:  You have a medical condition that increases your risk of infection and you have any cuts, sores, or bruises on your feet.  You have an injury that is not   healing.  You have redness on your legs or feet.  You feel burning or tingling in your legs or feet.  You have pain or cramps in your legs and feet.  Your legs or feet are numb.  Your feet always feel cold.  You have pain around a toenail. Get help right away if:  You have a wound, scrape, corn, or callus on your foot and: ? You have pain, swelling, or redness that gets worse. ? You have fluid or blood coming from the wound, scrape, corn, or callus. ? Your wound, scrape, corn, or callus feels warm to the touch. ? You have pus or a bad smell coming from the wound, scrape, corn, or callus. ? You have a fever. ? You have a red line going up your leg. Summary  Check your feet every day  for cuts, sores, red spots, swelling, and blisters.  Moisturize feet and legs daily.  Wear shoes that fit properly and have enough cushioning.  If you have foot problems, report any cuts, sores, or bruises to your health care provider immediately.  Schedule a complete foot exam at least once a year (annually) or more often if you have foot problems. This information is not intended to replace advice given to you by your health care provider. Make sure you discuss any questions you have with your health care provider. Document Revised: 07/08/2019 Document Reviewed: 11/16/2016 Elsevier Patient Education  2020 Elsevier Inc.  
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
## 2019-11-13 DIAGNOSIS — I739 Peripheral vascular disease, unspecified: Secondary | ICD-10-CM | POA: Diagnosis not present

## 2019-11-13 DIAGNOSIS — E1129 Type 2 diabetes mellitus with other diabetic kidney complication: Secondary | ICD-10-CM | POA: Diagnosis not present

## 2019-11-13 DIAGNOSIS — N1832 Chronic kidney disease, stage 3b: Secondary | ICD-10-CM | POA: Diagnosis not present

## 2019-11-13 DIAGNOSIS — D631 Anemia in chronic kidney disease: Secondary | ICD-10-CM | POA: Diagnosis not present

## 2019-11-13 DIAGNOSIS — I213 ST elevation (STEMI) myocardial infarction of unspecified site: Secondary | ICD-10-CM | POA: Diagnosis not present

## 2019-11-13 DIAGNOSIS — E1122 Type 2 diabetes mellitus with diabetic chronic kidney disease: Secondary | ICD-10-CM | POA: Diagnosis not present

## 2019-11-13 DIAGNOSIS — Z720 Tobacco use: Secondary | ICD-10-CM | POA: Diagnosis not present

## 2019-11-13 DIAGNOSIS — N183 Chronic kidney disease, stage 3 unspecified: Secondary | ICD-10-CM | POA: Diagnosis not present

## 2019-11-13 DIAGNOSIS — N179 Acute kidney failure, unspecified: Secondary | ICD-10-CM | POA: Diagnosis not present

## 2019-11-13 DIAGNOSIS — N189 Chronic kidney disease, unspecified: Secondary | ICD-10-CM | POA: Diagnosis not present

## 2019-11-13 DIAGNOSIS — I129 Hypertensive chronic kidney disease with stage 1 through stage 4 chronic kidney disease, or unspecified chronic kidney disease: Secondary | ICD-10-CM | POA: Diagnosis not present

## 2019-11-17 ENCOUNTER — Telehealth: Payer: Self-pay

## 2019-11-17 NOTE — Telephone Encounter (Signed)
What dose of Derrick Mosley is he using? Also, how are the sugars after dinner/at bedtime?

## 2019-11-17 NOTE — Telephone Encounter (Signed)
Patient called in stating that since being on his new insulin he wakes up to his sugar levels being low in the 60's. He is wanting to know if Dr would like him to lower or adjust  dosage that he is taking.     Please call and advise

## 2019-11-18 NOTE — Telephone Encounter (Signed)
Spoke to patient:  He takes 40 units of [place] daily.  CBG after dinner runs 101-140, before bed it runs 102-120  He is waking up with CBGs 54-56-59

## 2019-11-18 NOTE — Telephone Encounter (Signed)
M,  Perfect! Please advise him to decrease the Tresiba dose to 30 units and let me know how this goes in few days.

## 2019-11-18 NOTE — Telephone Encounter (Signed)
Notified patient of message from Dr. Gherghe, patient expressed understanding and agreement. No further questions.  

## 2019-11-30 DIAGNOSIS — E113491 Type 2 diabetes mellitus with severe nonproliferative diabetic retinopathy without macular edema, right eye: Secondary | ICD-10-CM | POA: Diagnosis not present

## 2019-11-30 DIAGNOSIS — E113412 Type 2 diabetes mellitus with severe nonproliferative diabetic retinopathy with macular edema, left eye: Secondary | ICD-10-CM | POA: Diagnosis not present

## 2019-12-10 ENCOUNTER — Ambulatory Visit (INDEPENDENT_AMBULATORY_CARE_PROVIDER_SITE_OTHER): Payer: Medicare Other | Admitting: Internal Medicine

## 2019-12-10 ENCOUNTER — Encounter: Payer: Self-pay | Admitting: Internal Medicine

## 2019-12-10 ENCOUNTER — Other Ambulatory Visit: Payer: Self-pay

## 2019-12-10 VITALS — BP 130/80 | HR 90 | Ht 71.0 in | Wt 214.0 lb

## 2019-12-10 DIAGNOSIS — E669 Obesity, unspecified: Secondary | ICD-10-CM

## 2019-12-10 DIAGNOSIS — E782 Mixed hyperlipidemia: Secondary | ICD-10-CM

## 2019-12-10 DIAGNOSIS — E1159 Type 2 diabetes mellitus with other circulatory complications: Secondary | ICD-10-CM | POA: Diagnosis not present

## 2019-12-10 DIAGNOSIS — E1165 Type 2 diabetes mellitus with hyperglycemia: Secondary | ICD-10-CM | POA: Diagnosis not present

## 2019-12-10 LAB — POCT GLYCOSYLATED HEMOGLOBIN (HGB A1C): Hemoglobin A1C: 11 % — AB (ref 4.0–5.6)

## 2019-12-10 MED ORDER — TRESIBA FLEXTOUCH 200 UNIT/ML ~~LOC~~ SOPN: 30.0000 [IU] | PEN_INJECTOR | Freq: Every day | SUBCUTANEOUS | 3 refills | Status: DC
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
## 2019-12-10 NOTE — Addendum Note (Signed)
Addended by: Cardell Peach I on: 12/10/2019 04:37 PM   Modules accepted: Orders

## 2019-12-10 NOTE — Patient Instructions (Signed)
Please decrease: - Tresiba to 30 units daily  Please increase: - Ozempic to 0.5 mg wekly  Please return in 3 months.

## 2019-12-28 DIAGNOSIS — E113293 Type 2 diabetes mellitus with mild nonproliferative diabetic retinopathy without macular edema, bilateral: Secondary | ICD-10-CM | POA: Diagnosis not present

## 2019-12-28 DIAGNOSIS — H34812 Central retinal vein occlusion, left eye, with macular edema: Secondary | ICD-10-CM | POA: Diagnosis not present

## 2020-01-25 DIAGNOSIS — H34812 Central retinal vein occlusion, left eye, with macular edema: Secondary | ICD-10-CM | POA: Diagnosis not present

## 2020-01-26 DIAGNOSIS — M545 Low back pain: Secondary | ICD-10-CM | POA: Diagnosis not present

## 2020-01-27 ENCOUNTER — Encounter: Payer: Self-pay | Admitting: Podiatry

## 2020-01-27 ENCOUNTER — Other Ambulatory Visit: Payer: Self-pay

## 2020-01-27 ENCOUNTER — Ambulatory Visit (INDEPENDENT_AMBULATORY_CARE_PROVIDER_SITE_OTHER): Payer: Medicare Other | Admitting: Podiatry

## 2020-01-27 VITALS — Temp 95.9°F

## 2020-01-27 DIAGNOSIS — B351 Tinea unguium: Secondary | ICD-10-CM

## 2020-01-27 DIAGNOSIS — L84 Corns and callosities: Secondary | ICD-10-CM

## 2020-01-27 DIAGNOSIS — E1142 Type 2 diabetes mellitus with diabetic polyneuropathy: Secondary | ICD-10-CM

## 2020-01-27 DIAGNOSIS — Z89431 Acquired absence of right foot: Secondary | ICD-10-CM

## 2020-01-27 NOTE — Patient Instructions (Signed)
Diabetes Mellitus and Foot Care Foot care is an important part of your health, especially when you have diabetes. Diabetes may cause you to have problems because of poor blood flow (circulation) to your feet and legs, which can cause your skin to:  Become thinner and drier.  Break more easily.  Heal more slowly.  Peel and crack. You may also have nerve damage (neuropathy) in your legs and feet, causing decreased feeling in them. This means that you may not notice minor injuries to your feet that could lead to more serious problems. Noticing and addressing any potential problems early is the best way to prevent future foot problems. How to care for your feet Foot hygiene  Wash your feet daily with warm water and mild soap. Do not use hot water. Then, pat your feet and the areas between your toes until they are completely dry. Do not soak your feet as this can dry your skin.  Trim your toenails straight across. Do not dig under them or around the cuticle. File the edges of your nails with an emery board or nail file.  Apply a moisturizing lotion or petroleum jelly to the skin on your feet and to dry, brittle toenails. Use lotion that does not contain alcohol and is unscented. Do not apply lotion between your toes. Shoes and socks  Wear clean socks or stockings every day. Make sure they are not too tight. Do not wear knee-high stockings since they may decrease blood flow to your legs.  Wear shoes that fit properly and have enough cushioning. Always look in your shoes before you put them on to be sure there are no objects inside.  To break in new shoes, wear them for just a few hours a day. This prevents injuries on your feet. Wounds, scrapes, corns, and calluses  Check your feet daily for blisters, cuts, bruises, sores, and redness. If you cannot see the bottom of your feet, use a mirror or ask someone for help.  Do not cut corns or calluses or try to remove them with medicine.  If you  find a minor scrape, cut, or break in the skin on your feet, keep it and the skin around it clean and dry. You may clean these areas with mild soap and water. Do not clean the area with peroxide, alcohol, or iodine.  If you have a wound, scrape, corn, or callus on your foot, look at it several times a day to make sure it is healing and not infected. Check for: ? Redness, swelling, or pain. ? Fluid or blood. ? Warmth. ? Pus or a bad smell. General instructions  Do not cross your legs. This may decrease blood flow to your feet.  Do not use heating pads or hot water bottles on your feet. They may burn your skin. If you have lost feeling in your feet or legs, you may not know this is happening until it is too late.  Protect your feet from hot and cold by wearing shoes, such as at the beach or on hot pavement.  Schedule a complete foot exam at least once a year (annually) or more often if you have foot problems. If you have foot problems, report any cuts, sores, or bruises to your health care provider immediately. Contact a health care provider if:  You have a medical condition that increases your risk of infection and you have any cuts, sores, or bruises on your feet.  You have an injury that is not   healing.  You have redness on your legs or feet.  You feel burning or tingling in your legs or feet.  You have pain or cramps in your legs and feet.  Your legs or feet are numb.  Your feet always feel cold.  You have pain around a toenail. Get help right away if:  You have a wound, scrape, corn, or callus on your foot and: ? You have pain, swelling, or redness that gets worse. ? You have fluid or blood coming from the wound, scrape, corn, or callus. ? Your wound, scrape, corn, or callus feels warm to the touch. ? You have pus or a bad smell coming from the wound, scrape, corn, or callus. ? You have a fever. ? You have a red line going up your leg. Summary  Check your feet every day  for cuts, sores, red spots, swelling, and blisters.  Moisturize feet and legs daily.  Wear shoes that fit properly and have enough cushioning.  If you have foot problems, report any cuts, sores, or bruises to your health care provider immediately.  Schedule a complete foot exam at least once a year (annually) or more often if you have foot problems. This information is not intended to replace advice given to you by your health care provider. Make sure you discuss any questions you have with your health care provider. Document Revised: 07/08/2019 Document Reviewed: 11/16/2016 Elsevier Patient Education  2020 Elsevier Inc.  
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
## 2020-02-03 DIAGNOSIS — E1151 Type 2 diabetes mellitus with diabetic peripheral angiopathy without gangrene: Secondary | ICD-10-CM | POA: Diagnosis not present

## 2020-02-03 DIAGNOSIS — R0609 Other forms of dyspnea: Secondary | ICD-10-CM | POA: Diagnosis not present

## 2020-02-03 DIAGNOSIS — I10 Essential (primary) hypertension: Secondary | ICD-10-CM | POA: Diagnosis not present

## 2020-02-03 DIAGNOSIS — F17290 Nicotine dependence, other tobacco product, uncomplicated: Secondary | ICD-10-CM | POA: Diagnosis not present

## 2020-02-03 DIAGNOSIS — N189 Chronic kidney disease, unspecified: Secondary | ICD-10-CM | POA: Diagnosis not present

## 2020-02-03 DIAGNOSIS — E785 Hyperlipidemia, unspecified: Secondary | ICD-10-CM | POA: Diagnosis not present

## 2020-02-03 DIAGNOSIS — I25118 Atherosclerotic heart disease of native coronary artery with other forms of angina pectoris: Secondary | ICD-10-CM | POA: Diagnosis not present

## 2020-02-04 DIAGNOSIS — Z23 Encounter for immunization: Secondary | ICD-10-CM | POA: Diagnosis not present

## 2020-02-05 DIAGNOSIS — G894 Chronic pain syndrome: Secondary | ICD-10-CM | POA: Diagnosis not present

## 2020-02-05 DIAGNOSIS — Z1159 Encounter for screening for other viral diseases: Secondary | ICD-10-CM | POA: Diagnosis not present

## 2020-02-05 DIAGNOSIS — Z79899 Other long term (current) drug therapy: Secondary | ICD-10-CM | POA: Diagnosis not present

## 2020-02-05 DIAGNOSIS — F17210 Nicotine dependence, cigarettes, uncomplicated: Secondary | ICD-10-CM | POA: Diagnosis not present

## 2020-02-08 ENCOUNTER — Other Ambulatory Visit: Payer: Self-pay

## 2020-02-08 ENCOUNTER — Ambulatory Visit: Payer: Medicare Other | Admitting: Orthotics

## 2020-02-08 DIAGNOSIS — I739 Peripheral vascular disease, unspecified: Secondary | ICD-10-CM

## 2020-02-08 DIAGNOSIS — E1142 Type 2 diabetes mellitus with diabetic polyneuropathy: Secondary | ICD-10-CM

## 2020-02-08 DIAGNOSIS — Z89431 Acquired absence of right foot: Secondary | ICD-10-CM

## 2020-02-08 DIAGNOSIS — L97519 Non-pressure chronic ulcer of other part of right foot with unspecified severity: Secondary | ICD-10-CM

## 2020-02-08 NOTE — Progress Notes (Signed)

## 2020-02-17 DIAGNOSIS — N2581 Secondary hyperparathyroidism of renal origin: Secondary | ICD-10-CM | POA: Diagnosis not present

## 2020-02-17 DIAGNOSIS — I129 Hypertensive chronic kidney disease with stage 1 through stage 4 chronic kidney disease, or unspecified chronic kidney disease: Secondary | ICD-10-CM | POA: Diagnosis not present

## 2020-02-17 DIAGNOSIS — E785 Hyperlipidemia, unspecified: Secondary | ICD-10-CM | POA: Diagnosis not present

## 2020-02-17 DIAGNOSIS — E1129 Type 2 diabetes mellitus with other diabetic kidney complication: Secondary | ICD-10-CM | POA: Diagnosis not present

## 2020-02-17 DIAGNOSIS — N1832 Chronic kidney disease, stage 3b: Secondary | ICD-10-CM | POA: Diagnosis not present

## 2020-02-17 DIAGNOSIS — N179 Acute kidney failure, unspecified: Secondary | ICD-10-CM | POA: Diagnosis not present

## 2020-02-17 DIAGNOSIS — E1122 Type 2 diabetes mellitus with diabetic chronic kidney disease: Secondary | ICD-10-CM | POA: Diagnosis not present

## 2020-02-17 DIAGNOSIS — I2510 Atherosclerotic heart disease of native coronary artery without angina pectoris: Secondary | ICD-10-CM | POA: Diagnosis not present

## 2020-02-17 DIAGNOSIS — N189 Chronic kidney disease, unspecified: Secondary | ICD-10-CM | POA: Diagnosis not present

## 2020-02-17 DIAGNOSIS — D631 Anemia in chronic kidney disease: Secondary | ICD-10-CM | POA: Diagnosis not present

## 2020-02-26 DIAGNOSIS — Z23 Encounter for immunization: Secondary | ICD-10-CM | POA: Diagnosis not present

## 2020-03-02 DIAGNOSIS — H34812 Central retinal vein occlusion, left eye, with macular edema: Secondary | ICD-10-CM | POA: Diagnosis not present

## 2020-03-04 ENCOUNTER — Other Ambulatory Visit: Payer: Self-pay

## 2020-03-08 ENCOUNTER — Other Ambulatory Visit: Payer: Self-pay

## 2020-03-08 ENCOUNTER — Encounter: Payer: Self-pay | Admitting: Internal Medicine

## 2020-03-08 ENCOUNTER — Ambulatory Visit (INDEPENDENT_AMBULATORY_CARE_PROVIDER_SITE_OTHER): Payer: Medicare Other | Admitting: Internal Medicine

## 2020-03-08 VITALS — BP 120/70 | HR 90 | Ht 71.0 in | Wt 207.0 lb

## 2020-03-08 DIAGNOSIS — E663 Overweight: Secondary | ICD-10-CM | POA: Diagnosis not present

## 2020-03-08 DIAGNOSIS — E1165 Type 2 diabetes mellitus with hyperglycemia: Secondary | ICD-10-CM

## 2020-03-08 DIAGNOSIS — E782 Mixed hyperlipidemia: Secondary | ICD-10-CM | POA: Diagnosis not present

## 2020-03-08 DIAGNOSIS — E1159 Type 2 diabetes mellitus with other circulatory complications: Secondary | ICD-10-CM | POA: Diagnosis not present

## 2020-03-08 LAB — POCT GLYCOSYLATED HEMOGLOBIN (HGB A1C): Hemoglobin A1C: 7.9 % — AB (ref 4.0–5.6)

## 2020-03-08 MED ORDER — TRESIBA FLEXTOUCH 200 UNIT/ML ~~LOC~~ SOPN: 30.0000 [IU] | PEN_INJECTOR | Freq: Every day | SUBCUTANEOUS | 3 refills | Status: DC

## 2020-03-08 NOTE — Addendum Note (Signed)
Addended by: Cardell Peach I on: 03/08/2020 03:33 PM   Modules accepted: Orders
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
## 2020-03-08 NOTE — Patient Instructions (Addendum)
Please continue: - Tresiba 30 units daily - Ozempic 0.5 mg weekly  Stop lollipops and sodas!  Please return in 3-4 months.

## 2020-03-11 DIAGNOSIS — Z79899 Other long term (current) drug therapy: Secondary | ICD-10-CM | POA: Diagnosis not present

## 2020-03-11 DIAGNOSIS — M545 Low back pain: Secondary | ICD-10-CM | POA: Diagnosis not present

## 2020-03-11 DIAGNOSIS — F17210 Nicotine dependence, cigarettes, uncomplicated: Secondary | ICD-10-CM | POA: Diagnosis not present

## 2020-03-11 DIAGNOSIS — G8929 Other chronic pain: Secondary | ICD-10-CM | POA: Diagnosis not present

## 2020-03-25 DIAGNOSIS — R079 Chest pain, unspecified: Secondary | ICD-10-CM | POA: Diagnosis not present

## 2020-04-06 DIAGNOSIS — H34812 Central retinal vein occlusion, left eye, with macular edema: Secondary | ICD-10-CM | POA: Diagnosis not present

## 2020-04-08 ENCOUNTER — Other Ambulatory Visit: Payer: Self-pay

## 2020-04-08 ENCOUNTER — Ambulatory Visit (INDEPENDENT_AMBULATORY_CARE_PROVIDER_SITE_OTHER): Payer: Medicare Other

## 2020-04-08 ENCOUNTER — Ambulatory Visit: Payer: Medicare Other | Admitting: Orthotics

## 2020-04-08 ENCOUNTER — Ambulatory Visit (INDEPENDENT_AMBULATORY_CARE_PROVIDER_SITE_OTHER): Payer: Medicare Other | Admitting: Podiatry

## 2020-04-08 DIAGNOSIS — L97529 Non-pressure chronic ulcer of other part of left foot with unspecified severity: Secondary | ICD-10-CM | POA: Diagnosis not present

## 2020-04-08 DIAGNOSIS — E1149 Type 2 diabetes mellitus with other diabetic neurological complication: Secondary | ICD-10-CM

## 2020-04-08 DIAGNOSIS — M7989 Other specified soft tissue disorders: Secondary | ICD-10-CM

## 2020-04-08 DIAGNOSIS — Z89421 Acquired absence of other right toe(s): Secondary | ICD-10-CM | POA: Diagnosis not present

## 2020-04-08 DIAGNOSIS — E1142 Type 2 diabetes mellitus with diabetic polyneuropathy: Secondary | ICD-10-CM

## 2020-04-08 DIAGNOSIS — L84 Corns and callosities: Secondary | ICD-10-CM | POA: Diagnosis not present

## 2020-04-08 MED ORDER — DOXYCYCLINE HYCLATE 100 MG PO TABS: 100.0000 mg | ORAL_TABLET | Freq: Two times a day (BID) | ORAL | 0 refills | Status: DC

## 2020-04-08 NOTE — Progress Notes (Signed)
Subjective: 44 year old male presents the office today for concerns of a wound on the left big toe.  He states this has been on the last couple of months.  He is trying to hold off till his next appointment with Dr. Adah Perl but was not getting any better.  He states this started when he was in [place] is not sure exactly how it started but he noticed that when he was there.  Denies any pus to get some bloody drainage.  No recent injury that he can recall.  Last A1c was 7.9.Denies any systemic complaints such as fevers, chills, nausea, vomiting. No acute changes since last appointment, and no other complaints at this time.   Objective: AAO x3, NAD DP/PT pulses palpable bilaterally, CRT less than 3 seconds Sensation decreased with Semmes Weinstein monofilament.   On the plantar aspect the left hallux is an ulceration measuring prior to debridement 0.8 x 0.5 x 0.3 cm and after debridement measures 1 x 0.8 x 0.4 cm.  Prior to debridement there is fibrotic, necrotic tissue after debridement there was granular with small meta fibrotic tissue.  There is no probing to bone, undermining or tunneling.  Mild edema to the toe but there is no surrounding erythema, ascending cellulitis.  No fluctuation crepitation.  There is no malodor. No pain with calf compression, swelling, warmth, erythema      Assessment: Left hallux ulceration  Plan: -All treatment options discussed with the patient including all alternatives, risks, complications.  -X-rays obtained reviewed.  No definitive evidence of acute osteomyelitis or soft tissue emphysema. -Sharply debrided the wound today lasting over 312 with scalpel down to healthy, viable tissue.  Pre and post wound measurements are above.  There is minimal bleeding.  Now that the wound is been debrided to help with his progress and start to heal better.  Wound culture was also obtained.  Prescribe doxycycline.  Surgical shoe with offloading dispensed.  We will change  the wound dressing daily with Medihoney. -He appeared to have palpable pulses were held off on new arterial studies today. -Monitor for any clinical signs or symptoms of infection and directed to call the office immediately should any occur or go to the ER. -Patient encouraged to call the office with any questions, concerns, change in symptoms.   Trula Slade DPM

## 2020-04-11 DIAGNOSIS — Z79899 Other long term (current) drug therapy: Secondary | ICD-10-CM | POA: Diagnosis not present

## 2020-04-12 ENCOUNTER — Other Ambulatory Visit: Payer: Self-pay | Admitting: Podiatry

## 2020-04-12 LAB — WOUND CULTURE
MICRO NUMBER:: 10581536
SPECIMEN QUALITY:: ADEQUATE

## 2020-04-12 MED ORDER — AMPICILLIN 500 MG PO CAPS: 500.0000 mg | ORAL_CAPSULE | Freq: Two times a day (BID) | ORAL | 0 refills | Status: DC

## 2020-04-14 ENCOUNTER — Encounter: Payer: Self-pay | Admitting: Podiatrist

## 2020-04-14 ENCOUNTER — Ambulatory Visit (INDEPENDENT_AMBULATORY_CARE_PROVIDER_SITE_OTHER): Payer: Medicare Other | Admitting: Podiatrist

## 2020-04-14 ENCOUNTER — Other Ambulatory Visit: Payer: Self-pay

## 2020-04-14 VITALS — Temp 96.3°F

## 2020-04-14 DIAGNOSIS — L97529 Non-pressure chronic ulcer of other part of left foot with unspecified severity: Secondary | ICD-10-CM

## 2020-04-14 DIAGNOSIS — E1142 Type 2 diabetes mellitus with diabetic polyneuropathy: Secondary | ICD-10-CM

## 2020-04-14 NOTE — Progress Notes (Signed)
Subjective:  Patient presents today for continued care of ulceration of left plantar hallux.  Patient relates he is taking the doxycycline but has not been performing any local wound care.  Patient denies any new complaints.  Denies nausea, vomiting, fevers or chills.  Denies changes to ulceration  Objective:  Ulceration located left hallux plantar aspect.  Measurements carried out today and appear unchanged from the prior visit.  No redness, streaking or lymphingitis noted.  No probing to bone, no undermining, no active pus or pirulence noted.  Base of the wound is fibrous.    Pre debridement measurements:  0.8 x 0.6.x 0.3cm Post debridement measurements:  1cm x 0.9 x 0.4 cm   Vascular status unchanged with pedal pulses palpable, neurological status unchanged with neuropathy and very little sensation to the plantar foot  Assessment:  Ulceration left hallux plantarly  Plan: Discussed etiology, pathology, conservative vs. Surgical therapies and at this time office debridement using a #15 blade and a currette was performed.   Ulcer was debrided and fibrotic and necrotic tissue was resected to the level of bleeding or viable tissue. No deep abscess, no erythema, no edema, no cellulitis, no odor was encountered.  iodosorb and a sterile dressing was applied.  Patient was given instructions on offloading in the surgical shoe and dressing change consisting of Santyl (will use antibiotic ointment until he receives santyl) with saline wet to dry-   and was instructed to call immediately if any signs or symptoms of infection arise.  He will return in 2 weeks for follow up.

## 2020-04-14 NOTE — Patient Instructions (Signed)
DRESSING CHANGES Left FOOT:  WEAR SURGICAL SHOE AT ALL TIMES      2. CLEANSE ULCER WITH SALINE or soap and water  3. DAB DRY WITH GAUZE SPONGE.  4. APPLY A LIGHT AMOUNT OF antibiotic ointment -- then switch to Santyl when it arrives TO BASE OF ULCER.  5. APPLY OUTER DRESSING/BAND-AID AS INSTRUCTED.  6. WEAR SURGICAL SHOE DAILY AT ALL TIMES.  7. DO NOT WALK BAREFOOT!!!  8.  IF YOU EXPERIENCE ANY FEVER, CHILLS, NIGHTSWEATS, NAUSEA OR VOMITING, ELEVATED OR LOW BLOOD SUGARS, REPORT TO EMERGENCY ROOM.  9. IF YOU EXPERIENCE INCREASED REDNESS, PAIN, SWELLING, DISCOLORATION, ODOR, PUS, DRAINAGE OR WARMTH OF YOUR FOOT, REPORT TO EMERGENCY ROOM.

## 2020-04-15 ENCOUNTER — Telehealth: Payer: Self-pay | Admitting: *Deleted

## 2020-04-15 ENCOUNTER — Telehealth: Payer: Self-pay | Admitting: Podiatry

## 2020-04-15 MED ORDER — SANTYL 250 UNIT/GM EX OINT
1.0000 | TOPICAL_OINTMENT | Freq: Every day | CUTANEOUS | 5 refills | Status: DC
Start: 2020-04-15 — End: 2020-10-28

## 2020-04-15 NOTE — Telephone Encounter (Signed)
-----   Message from Trula Slade, DPM sent at 04/12/2020  8:31 PM EDT ----- Tivis Ringer- please let him know that the culture did show bacteria. I am going to change his antibiotic to ampicillin which I called into the pharmacy. Thanks.

## 2020-04-15 NOTE — Telephone Encounter (Signed)
-----   Message from Bronson Ing, [place] sent at 04/14/2020  1:01 PM EDT ----- Regarding: santyl Hi Reyna Lorenzi!  Could you order some Santyl for this patient?  He has an ulcer left hallux plantar aspect  Post debridement measurements are:  1cm x 0.9cm x 0.4 cm  Base is fibrotic  Thanks so much!!!  Dr. Johnette Abraham

## 2020-04-15 NOTE — Telephone Encounter (Signed)
Left message informing pt of Dr. Leigh Aurora review of results and orders, and due to the importance of the message I did explain to pt.

## 2020-04-15 NOTE — Telephone Encounter (Signed)
Faxed required form, demographics and clinicals to [place].

## 2020-04-15 NOTE — Telephone Encounter (Signed)
-----   Message from Bronson Ing, [place] sent at 04/14/2020  1:01 PM EDT ----- Regarding: santyl Hi Wei Newbrough!  Could you order some Santyl for this patient?  He has an ulcer left hallux plantar aspect  Post debridement measurements are:  1cm x 0.9cm x 0.4 cm  Base is fibrotic  Thanks so much!!!  Dr. Johnette Abraham

## 2020-04-15 NOTE — Telephone Encounter (Signed)
Pt called and wanted to know if he needed to stop taking the other antibiotics  Or take with new medication

## 2020-04-15 NOTE — Telephone Encounter (Signed)
I asked pt the name of the medication he was questioning and he stated Doxycycline. I reviewed clinicals and 04/08/2020 Orders Only Dr. Jacqualyn Posey ordered Ampicillin and discontinued the Doxycycline. Pt states he didn't understand that. I told pt to stop the Doxycycline and pick up the Ampicillin at the pharmacy.

## 2020-04-17 NOTE — Telephone Encounter (Signed)
It is due to the culture that was obtained. Thanks.

## 2020-04-29 ENCOUNTER — Ambulatory Visit (INDEPENDENT_AMBULATORY_CARE_PROVIDER_SITE_OTHER): Payer: Medicare Other | Admitting: Podiatry

## 2020-04-29 ENCOUNTER — Ambulatory Visit: Payer: Medicare Other | Admitting: Podiatry

## 2020-04-29 ENCOUNTER — Other Ambulatory Visit: Payer: Self-pay

## 2020-04-29 DIAGNOSIS — L97529 Non-pressure chronic ulcer of other part of left foot with unspecified severity: Secondary | ICD-10-CM | POA: Diagnosis not present

## 2020-04-29 DIAGNOSIS — E1142 Type 2 diabetes mellitus with diabetic polyneuropathy: Secondary | ICD-10-CM

## 2020-05-03 ENCOUNTER — Encounter: Payer: Self-pay | Admitting: Podiatry
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
## 2020-05-11 DIAGNOSIS — H34812 Central retinal vein occlusion, left eye, with macular edema: Secondary | ICD-10-CM | POA: Diagnosis not present

## 2020-05-13 ENCOUNTER — Other Ambulatory Visit: Payer: Self-pay

## 2020-05-13 ENCOUNTER — Ambulatory Visit (INDEPENDENT_AMBULATORY_CARE_PROVIDER_SITE_OTHER): Payer: Medicare Other | Admitting: Podiatry

## 2020-05-13 DIAGNOSIS — L97529 Non-pressure chronic ulcer of other part of left foot with unspecified severity: Secondary | ICD-10-CM | POA: Diagnosis not present

## 2020-05-13 DIAGNOSIS — I999 Unspecified disorder of circulatory system: Secondary | ICD-10-CM | POA: Diagnosis not present

## 2020-05-13 DIAGNOSIS — E1142 Type 2 diabetes mellitus with diabetic polyneuropathy: Secondary | ICD-10-CM | POA: Diagnosis not present

## 2020-05-16 DIAGNOSIS — I25118 Atherosclerotic heart disease of native coronary artery with other forms of angina pectoris: Secondary | ICD-10-CM | POA: Diagnosis not present

## 2020-05-16 DIAGNOSIS — F17290 Nicotine dependence, other tobacco product, uncomplicated: Secondary | ICD-10-CM | POA: Diagnosis not present

## 2020-05-16 DIAGNOSIS — I10 Essential (primary) hypertension: Secondary | ICD-10-CM | POA: Diagnosis not present

## 2020-05-16 DIAGNOSIS — E785 Hyperlipidemia, unspecified: Secondary | ICD-10-CM | POA: Diagnosis not present

## 2020-05-16 DIAGNOSIS — E1151 Type 2 diabetes mellitus with diabetic peripheral angiopathy without gangrene: Secondary | ICD-10-CM | POA: Diagnosis not present

## 2020-05-17 ENCOUNTER — Telehealth: Payer: Self-pay | Admitting: *Deleted

## 2020-05-17 ENCOUNTER — Encounter: Payer: Self-pay | Admitting: Podiatry

## 2020-05-17 DIAGNOSIS — E1142 Type 2 diabetes mellitus with diabetic polyneuropathy: Secondary | ICD-10-CM

## 2020-05-17 DIAGNOSIS — L97529 Non-pressure chronic ulcer of other part of left foot with unspecified severity: Secondary | ICD-10-CM

## 2020-05-17 DIAGNOSIS — I999 Unspecified disorder of circulatory system: Secondary | ICD-10-CM

## 2020-05-17 NOTE — Telephone Encounter (Signed)
-----   Message from Felipa Furnace, DPM sent at 05/17/2020  6:10 AM EDT ----- Regarding: ABIs PVRs Hi Jalessa Peyser,  Would you be able to order his ABIs PVRs for this patient.  Thank you

## 2020-05-17 NOTE — Telephone Encounter (Signed)
Faxed orders and demographics to [facility].
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
## 2020-05-27 DIAGNOSIS — Z79899 Other long term (current) drug therapy: Secondary | ICD-10-CM | POA: Diagnosis not present

## 2020-06-01 ENCOUNTER — Ambulatory Visit: Payer: Medicare Other | Admitting: Podiatry

## 2020-06-03 ENCOUNTER — Ambulatory Visit (INDEPENDENT_AMBULATORY_CARE_PROVIDER_SITE_OTHER): Payer: Medicare Other

## 2020-06-03 ENCOUNTER — Other Ambulatory Visit: Payer: Self-pay

## 2020-06-03 ENCOUNTER — Ambulatory Visit (INDEPENDENT_AMBULATORY_CARE_PROVIDER_SITE_OTHER): Payer: Medicare Other | Admitting: Podiatry

## 2020-06-03 DIAGNOSIS — E1142 Type 2 diabetes mellitus with diabetic polyneuropathy: Secondary | ICD-10-CM | POA: Diagnosis not present

## 2020-06-03 DIAGNOSIS — I999 Unspecified disorder of circulatory system: Secondary | ICD-10-CM | POA: Diagnosis not present

## 2020-06-03 DIAGNOSIS — M869 Osteomyelitis, unspecified: Secondary | ICD-10-CM

## 2020-06-03 DIAGNOSIS — L97521 Non-pressure chronic ulcer of other part of left foot limited to breakdown of skin: Secondary | ICD-10-CM

## 2020-06-05 ENCOUNTER — Encounter: Payer: Self-pay | Admitting: Podiatry
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
## 2020-06-13 ENCOUNTER — Ambulatory Visit (HOSPITAL_COMMUNITY)
Admission: RE | Admit: 2020-06-13 | Discharge: 2020-06-13 | Disposition: A | Discharge status: Home / Self Care | Payer: Medicare Other | Source: Ambulatory Visit | Attending: Internal Medicine | Admitting: Internal Medicine

## 2020-06-13 ENCOUNTER — Other Ambulatory Visit: Payer: Self-pay

## 2020-06-13 DIAGNOSIS — E1142 Type 2 diabetes mellitus with diabetic polyneuropathy: Secondary | ICD-10-CM | POA: Diagnosis not present

## 2020-06-13 DIAGNOSIS — L97529 Non-pressure chronic ulcer of other part of left foot with unspecified severity: Secondary | ICD-10-CM | POA: Diagnosis not present

## 2020-06-13 DIAGNOSIS — I999 Unspecified disorder of circulatory system: Secondary | ICD-10-CM | POA: Diagnosis not present

## 2020-06-15 DIAGNOSIS — H34812 Central retinal vein occlusion, left eye, with macular edema: Secondary | ICD-10-CM | POA: Diagnosis not present

## 2020-06-17 ENCOUNTER — Encounter: Payer: Self-pay | Admitting: Internal Medicine

## 2020-06-17 ENCOUNTER — Ambulatory Visit (INDEPENDENT_AMBULATORY_CARE_PROVIDER_SITE_OTHER): Payer: Medicare Other | Admitting: Internal Medicine

## 2020-06-17 ENCOUNTER — Other Ambulatory Visit: Payer: Self-pay

## 2020-06-17 VITALS — BP 110/88 | HR 86 | Ht 71.0 in | Wt 206.0 lb

## 2020-06-17 DIAGNOSIS — E1159 Type 2 diabetes mellitus with other circulatory complications: Secondary | ICD-10-CM | POA: Diagnosis not present

## 2020-06-17 DIAGNOSIS — E782 Mixed hyperlipidemia: Secondary | ICD-10-CM | POA: Diagnosis not present

## 2020-06-17 DIAGNOSIS — E663 Overweight: Secondary | ICD-10-CM

## 2020-06-17 DIAGNOSIS — E1165 Type 2 diabetes mellitus with hyperglycemia: Secondary | ICD-10-CM

## 2020-06-17 LAB — POCT GLYCOSYLATED HEMOGLOBIN (HGB A1C): Hemoglobin A1C: 7 % — AB (ref 4.0–5.6)

## 2020-06-17 MED ORDER — FREESTYLE LIBRE 14 DAY SENSOR MISC: 1.0000 | 3 refills | Status: DC

## 2020-06-17 MED ORDER — OZEMPIC (0.25 OR 0.5 MG/DOSE) 2 MG/1.5ML ~~LOC~~ SOPN: 0.5000 mg | PEN_INJECTOR | SUBCUTANEOUS | 3 refills | Status: DC
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
## 2020-06-17 NOTE — Addendum Note (Signed)
Addended by: Cardell Peach I on: 06/17/2020 03:10 PM   Modules accepted: Orders

## 2020-06-17 NOTE — Patient Instructions (Addendum)
Please continue: - Tresiba 30 units daily - Ozempic 0.5 mg weekly  Start checking sugars 1x a day.  Please return in 4 months.

## 2020-06-21 DIAGNOSIS — M5416 Radiculopathy, lumbar region: Secondary | ICD-10-CM | POA: Diagnosis not present

## 2020-06-21 DIAGNOSIS — M545 Low back pain: Secondary | ICD-10-CM | POA: Diagnosis not present

## 2020-06-21 DIAGNOSIS — M5380 Other specified dorsopathies, site unspecified: Secondary | ICD-10-CM | POA: Diagnosis not present

## 2020-06-22 ENCOUNTER — Other Ambulatory Visit (HOSPITAL_COMMUNITY): Payer: Self-pay | Admitting: Orthopedic Surgery

## 2020-06-22 ENCOUNTER — Other Ambulatory Visit: Payer: Self-pay | Admitting: Orthopedic Surgery

## 2020-06-22 DIAGNOSIS — M545 Low back pain, unspecified: Secondary | ICD-10-CM

## 2020-06-24 ENCOUNTER — Ambulatory Visit (INDEPENDENT_AMBULATORY_CARE_PROVIDER_SITE_OTHER): Payer: Medicare Other | Admitting: Podiatry

## 2020-06-24 ENCOUNTER — Encounter: Payer: Self-pay | Admitting: Podiatry

## 2020-06-24 ENCOUNTER — Other Ambulatory Visit: Payer: Self-pay

## 2020-06-24 DIAGNOSIS — L97521 Non-pressure chronic ulcer of other part of left foot limited to breakdown of skin: Secondary | ICD-10-CM

## 2020-06-24 DIAGNOSIS — I999 Unspecified disorder of circulatory system: Secondary | ICD-10-CM

## 2020-06-24 DIAGNOSIS — E1142 Type 2 diabetes mellitus with diabetic polyneuropathy: Secondary | ICD-10-CM
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
## 2020-07-06 ENCOUNTER — Ambulatory Visit (HOSPITAL_COMMUNITY)
Admission: RE | Admit: 2020-07-06 | Discharge: 2020-07-06 | Disposition: A | Discharge status: Home / Self Care | Payer: Medicare Other | Source: Ambulatory Visit | Attending: Orthopedic Surgery | Admitting: Orthopedic Surgery

## 2020-07-06 ENCOUNTER — Other Ambulatory Visit: Payer: Self-pay

## 2020-07-06 DIAGNOSIS — M545 Low back pain, unspecified: Secondary | ICD-10-CM

## 2020-07-11 ENCOUNTER — Telehealth: Payer: Self-pay | Admitting: Podiatry

## 2020-07-11 ENCOUNTER — Ambulatory Visit: Payer: Medicare Other | Admitting: Podiatry

## 2020-07-11 NOTE — Telephone Encounter (Signed)
Patient daughter tested positve for Covid, he called to r/s. After discussing with Dr. Jacqualyn Posey and Dr. Paulla Dolly, we decided to r/s pts appt. Mr. Bevill said wound looks about the same as before. Not having any problems  Or concerns, wound still looking the same as last appt. Still using stuff to put in wound.

## 2020-07-22 ENCOUNTER — Encounter: Payer: Self-pay | Admitting: Podiatry

## 2020-07-22 ENCOUNTER — Ambulatory Visit (INDEPENDENT_AMBULATORY_CARE_PROVIDER_SITE_OTHER): Payer: Medicare Other | Admitting: Podiatry

## 2020-07-22 ENCOUNTER — Other Ambulatory Visit: Payer: Self-pay

## 2020-07-22 DIAGNOSIS — L97521 Non-pressure chronic ulcer of other part of left foot limited to breakdown of skin: Secondary | ICD-10-CM

## 2020-07-22 DIAGNOSIS — E1142 Type 2 diabetes mellitus with diabetic polyneuropathy: Secondary | ICD-10-CM | POA: Diagnosis not present
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
## 2020-07-25 DIAGNOSIS — M5416 Radiculopathy, lumbar region: Secondary | ICD-10-CM | POA: Diagnosis not present

## 2020-08-01 DIAGNOSIS — M5416 Radiculopathy, lumbar region: Secondary | ICD-10-CM | POA: Diagnosis not present

## 2020-08-01 DIAGNOSIS — M5380 Other specified dorsopathies, site unspecified: Secondary | ICD-10-CM | POA: Diagnosis not present

## 2020-08-03 DIAGNOSIS — H34812 Central retinal vein occlusion, left eye, with macular edema: Secondary | ICD-10-CM | POA: Diagnosis not present

## 2020-08-05 ENCOUNTER — Ambulatory Visit: Payer: Medicare Other | Admitting: Podiatry

## 2020-08-17 ENCOUNTER — Ambulatory Visit (INDEPENDENT_AMBULATORY_CARE_PROVIDER_SITE_OTHER): Payer: Medicare Other | Admitting: Podiatry

## 2020-08-17 ENCOUNTER — Encounter: Payer: Self-pay | Admitting: Podiatry

## 2020-08-17 ENCOUNTER — Other Ambulatory Visit: Payer: Self-pay

## 2020-08-17 DIAGNOSIS — E1142 Type 2 diabetes mellitus with diabetic polyneuropathy: Secondary | ICD-10-CM

## 2020-08-17 DIAGNOSIS — L97521 Non-pressure chronic ulcer of other part of left foot limited to breakdown of skin: Secondary | ICD-10-CM
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
## 2020-08-20 IMAGING — MR MR HEAD W/O CM
9 of 11 series · 38 of 48 positions shown · non-contrast
Comparison: None.

CLINICAL DATA: Dizziness

EXAM:
MRI HEAD WITHOUT CONTRAST
TECHNIQUE: Multiplanar, multiecho pulse sequences of the brain and surrounding
structures were obtained without intravenous contrast.

[Series 2: T1 · sagittal · 5.0mm · 0.43mm/px · 1 of 20 slices shown (1 of 2)]
[im 1/20]
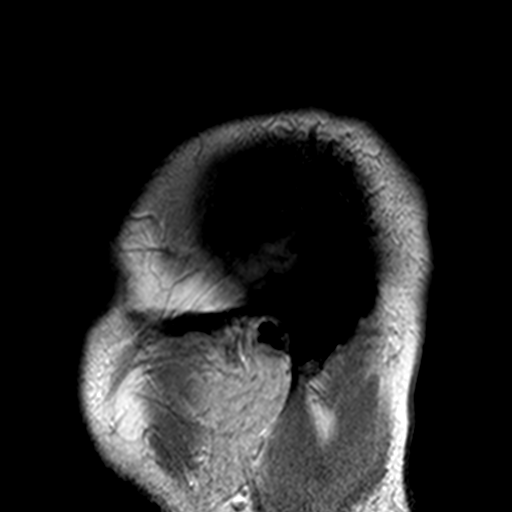

[Series 3: DWI · axial · 3.0mm · 0.82mm/px · z∈[-6,+141]mm · 6 of 50 slices shown (1 of 2)]
[im 1/50]
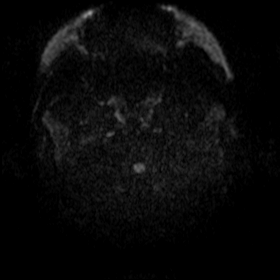
[im 10/50]
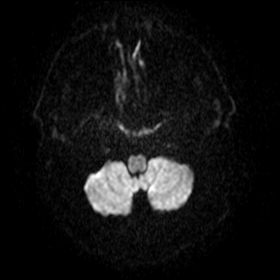
[im 20/50]
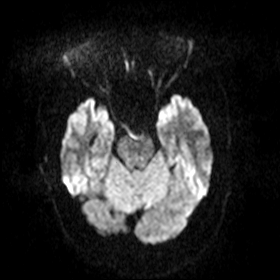
[im 30/50]
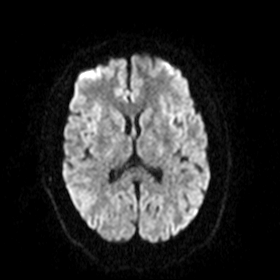
[im 40/50]
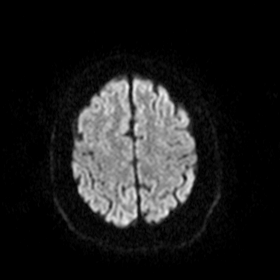
[im 50/50]
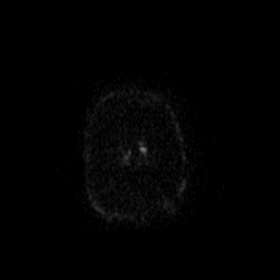

[Series 4: ax dwi_adc · axial · 3.0mm · 0.82mm/px · z∈[-6,+141]mm · 6 of 50 slices shown]
[im 1/50]
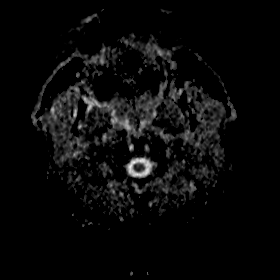
[im 10/50]
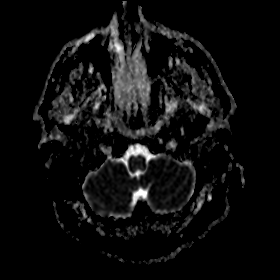
[im 20/50]
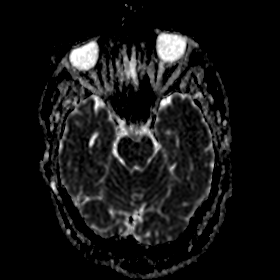
[im 30/50]
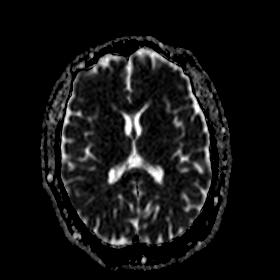
[im 40/50]
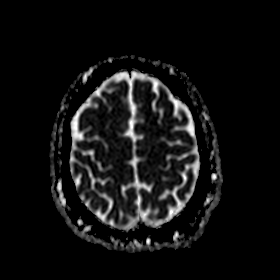
[im 50/50]
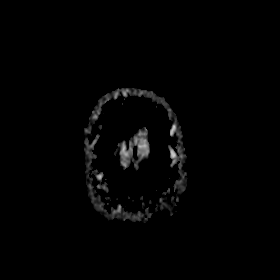

[Series 5: DWI · coronal · 5.0mm · 0.52mm/px · 4 of 34 slices shown (2 of 2)]
[im 1/34]
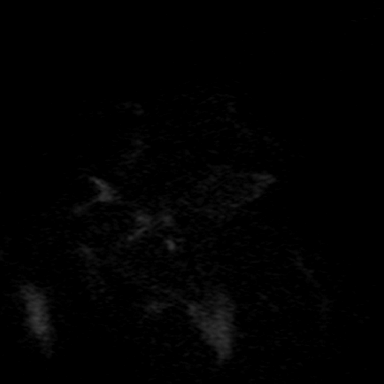
[im 12/34]
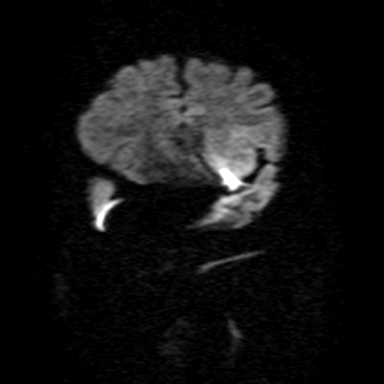
[im 23/34]
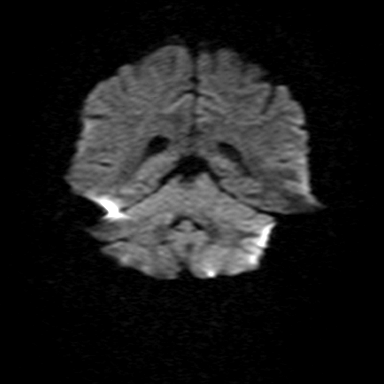
[im 34/34]
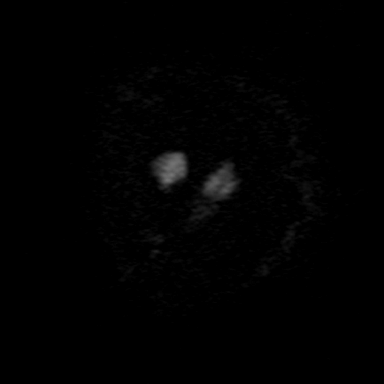

[Series 7: T2 · axial · 5.0mm · 0.75mm/px · z∈[-4,+139]mm · 3 of 23 slices shown (1 of 3)]
[im 1/23]
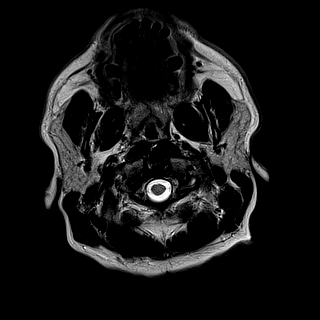
[im 12/23]
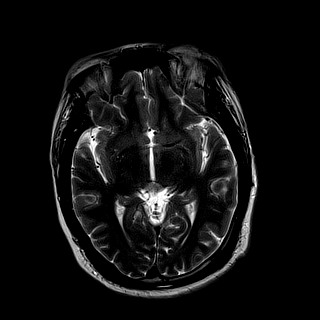
[im 23/23]
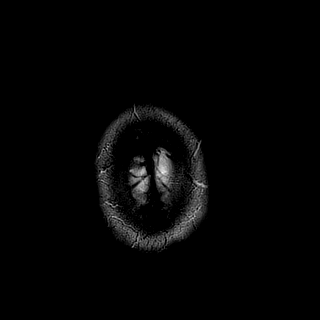

[Series 8: T2 · axial · 5.0mm · 0.45mm/px · z∈[+3,+133]mm · 2 of 21 slices shown (2 of 3)]
[im 1/21]
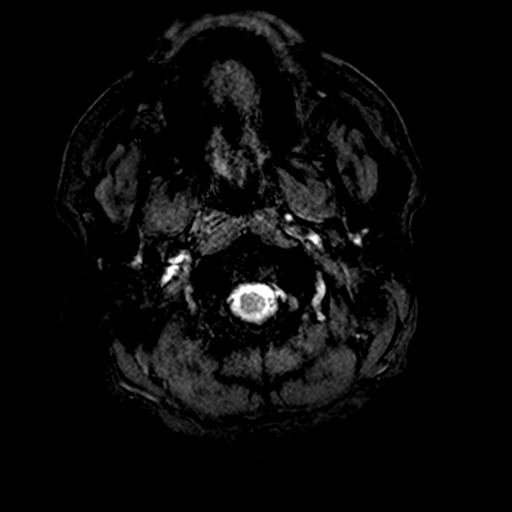
[im 21/21]
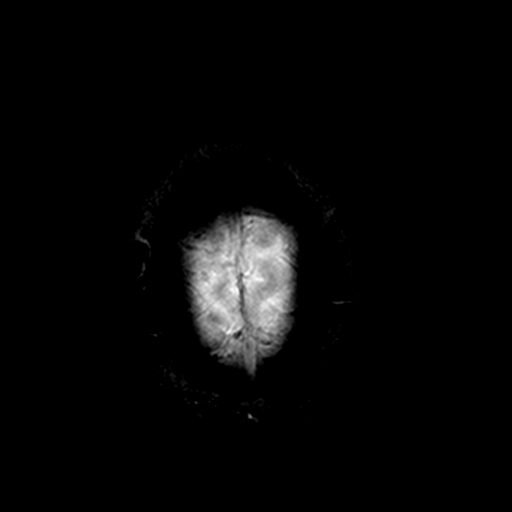

[Series 9: FLAIR · axial · 3.0mm · 0.94mm/px · z∈[-1,+137]mm · 5 of 47 slices shown]
[im 1/47]
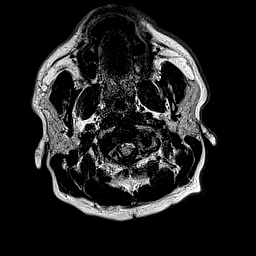
[im 12/47]
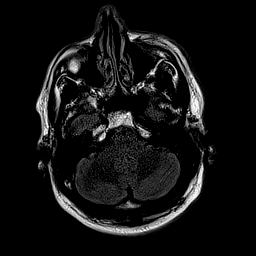
[im 24/47]
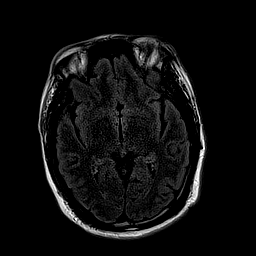
[im 35/47]
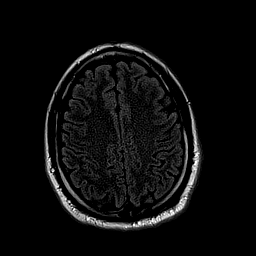
[im 47/47]
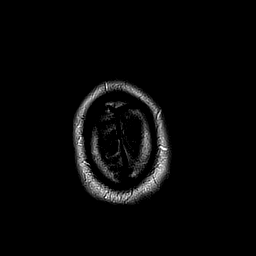

[Series 10: T1 · axial · 2.0mm · 0.47mm/px · z∈[-18,+170]mm · 8 of 95 slices shown (2 of 2)]
[im 1/95]
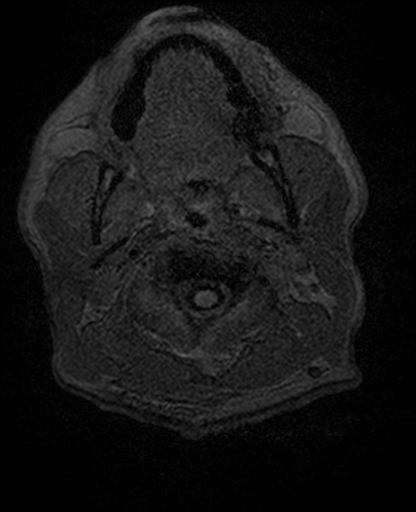
[im 19/95]
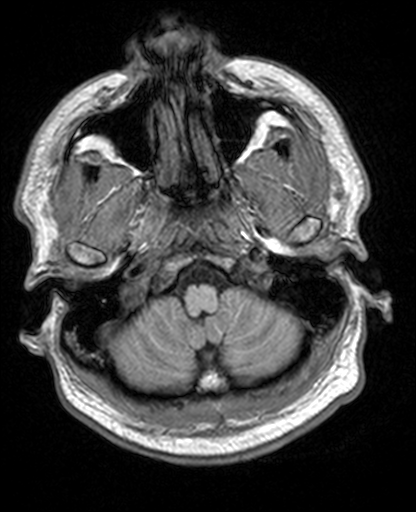
[im 29/95]
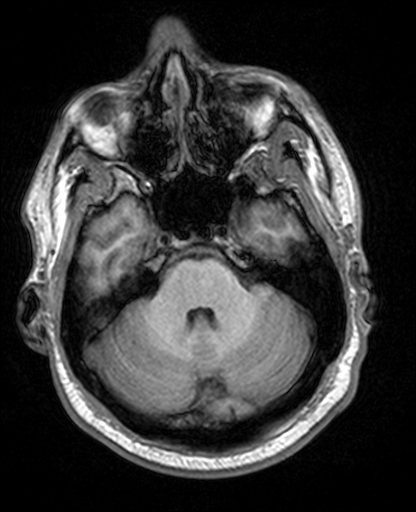
[im 38/95]
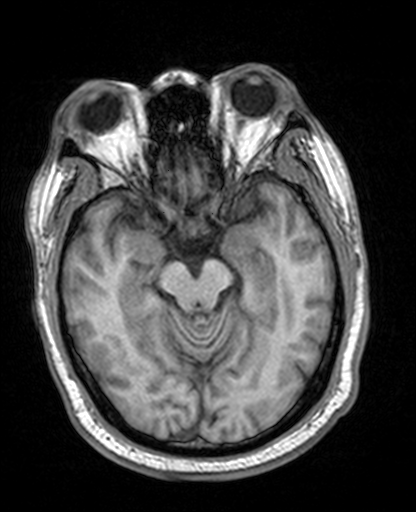
[im 57/95]
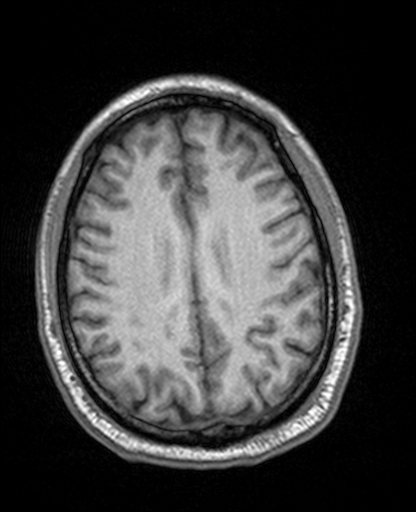
[im 66/95]
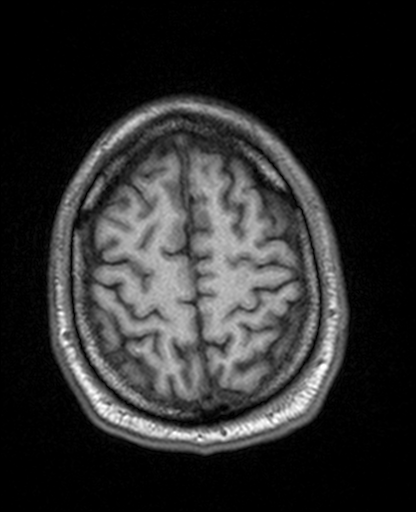
[im 76/95]
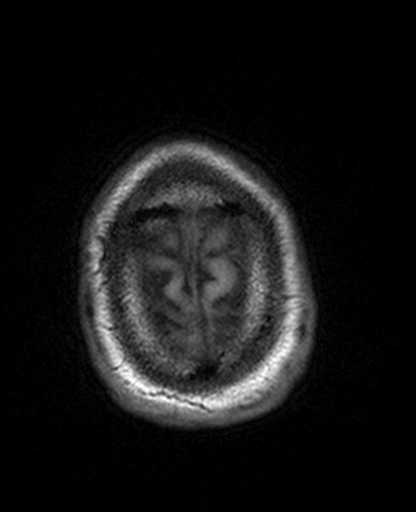
[im 95/95]
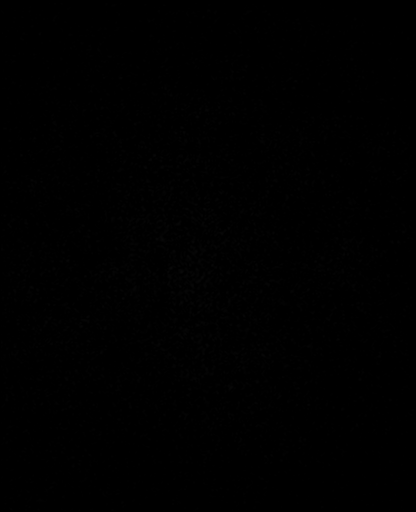

[Series 11: T2 · coronal · 5.0mm · 0.67mm/px · 3 of 26 slices shown (3 of 3)]
[im 1/26]
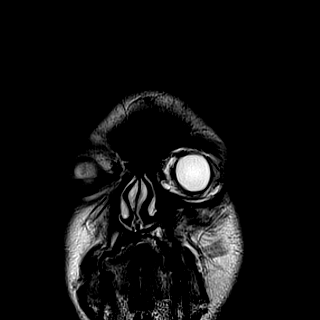
[im 13/26]
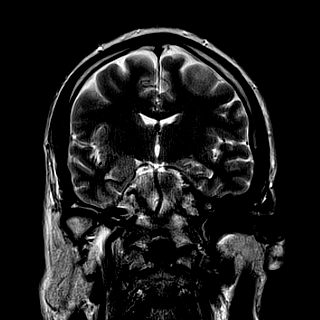
[im 26/26]
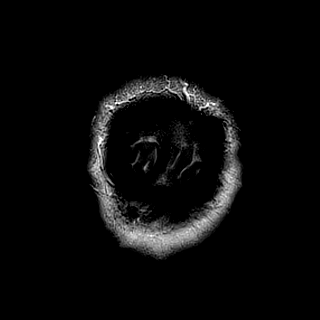

[38 of 48 positions shown; findings below may reference images not displayed]

FINDINGS: Brain: There is no acute infarction or intracranial hemorrhage.
There is no intracranial mass, mass effect, or edema. There is no
hydrocephalus or extra-axial fluid collection.

Vascular: Major vessel flow voids at the skull base are preserved.

Skull and upper cervical spine: Normal marrow signal is preserved.

Sinuses/Orbits: Trace paranasal sinus mucosal thickening. Orbits are
unremarkable.

Other: Sella is unremarkable.  Mastoid air cells are clear.
IMPRESSION: No evidence of recent infarction, intracranial hemorrhage, or mass.

## 2020-08-26 ENCOUNTER — Ambulatory Visit (INDEPENDENT_AMBULATORY_CARE_PROVIDER_SITE_OTHER): Payer: Medicare Other | Admitting: Podiatry

## 2020-08-26 ENCOUNTER — Other Ambulatory Visit: Payer: Self-pay

## 2020-08-26 ENCOUNTER — Encounter: Payer: Self-pay | Admitting: Podiatry

## 2020-08-26 DIAGNOSIS — I10 Essential (primary) hypertension: Secondary | ICD-10-CM | POA: Diagnosis not present

## 2020-08-26 DIAGNOSIS — N189 Chronic kidney disease, unspecified: Secondary | ICD-10-CM | POA: Diagnosis not present

## 2020-08-26 DIAGNOSIS — L97521 Non-pressure chronic ulcer of other part of left foot limited to breakdown of skin: Secondary | ICD-10-CM | POA: Diagnosis not present

## 2020-08-26 DIAGNOSIS — E1142 Type 2 diabetes mellitus with diabetic polyneuropathy: Secondary | ICD-10-CM

## 2020-08-26 DIAGNOSIS — F17290 Nicotine dependence, other tobacco product, uncomplicated: Secondary | ICD-10-CM | POA: Diagnosis not present

## 2020-08-26 DIAGNOSIS — E1151 Type 2 diabetes mellitus with diabetic peripheral angiopathy without gangrene: Secondary | ICD-10-CM | POA: Diagnosis not present

## 2020-08-26 DIAGNOSIS — E785 Hyperlipidemia, unspecified: Secondary | ICD-10-CM | POA: Diagnosis not present

## 2020-08-26 DIAGNOSIS — I25118 Atherosclerotic heart disease of native coronary artery with other forms of angina pectoris: Secondary | ICD-10-CM | POA: Diagnosis not present
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
## 2020-09-01 ENCOUNTER — Telehealth: Payer: Self-pay | Admitting: Internal Medicine

## 2020-09-01 MED ORDER — TRESIBA FLEXTOUCH 100 UNIT/ML ~~LOC~~ SOPN: 30.0000 [IU] | PEN_INJECTOR | Freq: Every day | SUBCUTANEOUS | 2 refills | Status: DC

## 2020-09-01 NOTE — Telephone Encounter (Signed)
Patient has been out of the following medication for a week. Patient went to [facility] to pick up insulin degludec (TRESIBA FLEXTOUCH) 200 UNIT/ML FlexTouch Pen, however the PHARM told patient the medication listed above is on back order and that they do have [place] Flextouch Pen 100 units available. Patient requests a RX for [place] Flextouch Pen 100 Units (with double the amount of pens to equal the 200 unit RX) be sent asap to: [place], [place] - [id number] [place] [address] [place] Phone:  [phone]  Fax:  [phone]     If questions please contact patient.

## 2020-09-01 NOTE — Addendum Note (Signed)
Addended by: Caprice Beaver T on: 09/01/2020 04:57 PM   Modules accepted: Orders

## 2020-09-01 NOTE — Telephone Encounter (Signed)
Called and notified patient, RX sent to pharmacy.  Patient verbalized understanding.

## 2020-09-01 NOTE — Telephone Encounter (Signed)
NO!  He does not need to double the dose! This is automatically taken into account whenever he uses the U200 pens (for this, he injects a lower volume)

## 2020-09-01 NOTE — Telephone Encounter (Signed)
Is it okay to do this?  Since they do not have the 200 units, he wants double the pens since they only have 100 unit pens. ?  Thanks!

## 2020-09-02 ENCOUNTER — Ambulatory Visit (INDEPENDENT_AMBULATORY_CARE_PROVIDER_SITE_OTHER): Payer: Medicare Other | Admitting: Podiatry

## 2020-09-02 ENCOUNTER — Other Ambulatory Visit: Payer: Self-pay

## 2020-09-02 DIAGNOSIS — L97521 Non-pressure chronic ulcer of other part of left foot limited to breakdown of skin: Secondary | ICD-10-CM | POA: Diagnosis not present

## 2020-09-02 DIAGNOSIS — E1142 Type 2 diabetes mellitus with diabetic polyneuropathy: Secondary | ICD-10-CM

## 2020-09-06 ENCOUNTER — Encounter: Payer: Self-pay | Admitting: Podiatry
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
## 2020-09-09 ENCOUNTER — Ambulatory Visit: Payer: Self-pay | Admitting: Orthopedic Surgery

## 2020-09-09 ENCOUNTER — Ambulatory Visit (INDEPENDENT_AMBULATORY_CARE_PROVIDER_SITE_OTHER): Payer: Medicare Other | Admitting: Podiatry

## 2020-09-09 ENCOUNTER — Other Ambulatory Visit: Payer: Self-pay

## 2020-09-09 DIAGNOSIS — L97521 Non-pressure chronic ulcer of other part of left foot limited to breakdown of skin: Secondary | ICD-10-CM

## 2020-09-09 DIAGNOSIS — E1142 Type 2 diabetes mellitus with diabetic polyneuropathy: Secondary | ICD-10-CM

## 2020-09-13 ENCOUNTER — Encounter: Payer: Self-pay | Admitting: Podiatry
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
## 2020-09-16 ENCOUNTER — Other Ambulatory Visit: Payer: Self-pay

## 2020-09-16 ENCOUNTER — Ambulatory Visit (INDEPENDENT_AMBULATORY_CARE_PROVIDER_SITE_OTHER): Payer: Medicare Other | Admitting: Podiatry

## 2020-09-16 DIAGNOSIS — E1142 Type 2 diabetes mellitus with diabetic polyneuropathy: Secondary | ICD-10-CM | POA: Diagnosis not present

## 2020-09-16 DIAGNOSIS — L97521 Non-pressure chronic ulcer of other part of left foot limited to breakdown of skin: Secondary | ICD-10-CM | POA: Diagnosis not present

## 2020-09-16 MED ORDER — DOXYCYCLINE HYCLATE 100 MG PO TABS: 100.0000 mg | ORAL_TABLET | Freq: Two times a day (BID) | ORAL | 0 refills | Status: DC

## 2020-09-16 MED ORDER — DOXYCYCLINE HYCLATE 100 MG PO TABS
100.0000 mg | ORAL_TABLET | Freq: Two times a day (BID) | ORAL | 0 refills | Status: DC
Start: 2020-09-16 — End: 2020-10-28

## 2020-09-19 DIAGNOSIS — D631 Anemia in chronic kidney disease: Secondary | ICD-10-CM | POA: Diagnosis not present

## 2020-09-19 DIAGNOSIS — E1129 Type 2 diabetes mellitus with other diabetic kidney complication: Secondary | ICD-10-CM | POA: Diagnosis not present

## 2020-09-19 DIAGNOSIS — E1122 Type 2 diabetes mellitus with diabetic chronic kidney disease: Secondary | ICD-10-CM | POA: Diagnosis not present

## 2020-09-19 DIAGNOSIS — N2581 Secondary hyperparathyroidism of renal origin: Secondary | ICD-10-CM | POA: Diagnosis not present

## 2020-09-19 DIAGNOSIS — N179 Acute kidney failure, unspecified: Secondary | ICD-10-CM | POA: Diagnosis not present

## 2020-09-19 DIAGNOSIS — E785 Hyperlipidemia, unspecified: Secondary | ICD-10-CM | POA: Diagnosis not present

## 2020-09-19 DIAGNOSIS — I2510 Atherosclerotic heart disease of native coronary artery without angina pectoris: Secondary | ICD-10-CM | POA: Diagnosis not present

## 2020-09-19 DIAGNOSIS — N1832 Chronic kidney disease, stage 3b: Secondary | ICD-10-CM | POA: Diagnosis not present

## 2020-09-19 DIAGNOSIS — I129 Hypertensive chronic kidney disease with stage 1 through stage 4 chronic kidney disease, or unspecified chronic kidney disease: Secondary | ICD-10-CM | POA: Diagnosis not present

## 2020-09-19 DIAGNOSIS — N189 Chronic kidney disease, unspecified: Secondary | ICD-10-CM | POA: Diagnosis not present

## 2020-09-20 ENCOUNTER — Encounter: Payer: Self-pay | Admitting: Podiatry

## 2020-09-20 DIAGNOSIS — I739 Peripheral vascular disease, unspecified: Secondary | ICD-10-CM | POA: Diagnosis not present

## 2020-09-20 DIAGNOSIS — E1129 Type 2 diabetes mellitus with other diabetic kidney complication: Secondary | ICD-10-CM | POA: Diagnosis not present

## 2020-09-20 DIAGNOSIS — I2510 Atherosclerotic heart disease of native coronary artery without angina pectoris: Secondary | ICD-10-CM | POA: Diagnosis not present

## 2020-09-20 DIAGNOSIS — E1159 Type 2 diabetes mellitus with other circulatory complications: Secondary | ICD-10-CM | POA: Diagnosis not present
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
## 2020-09-28 ENCOUNTER — Ambulatory Visit (INDEPENDENT_AMBULATORY_CARE_PROVIDER_SITE_OTHER): Payer: Medicare Other | Admitting: Podiatry

## 2020-09-28 ENCOUNTER — Other Ambulatory Visit: Payer: Self-pay

## 2020-09-28 DIAGNOSIS — L97521 Non-pressure chronic ulcer of other part of left foot limited to breakdown of skin: Secondary | ICD-10-CM | POA: Diagnosis not present

## 2020-09-28 DIAGNOSIS — H34812 Central retinal vein occlusion, left eye, with macular edema: Secondary | ICD-10-CM | POA: Diagnosis not present

## 2020-09-28 DIAGNOSIS — E1142 Type 2 diabetes mellitus with diabetic polyneuropathy: Secondary | ICD-10-CM

## 2020-09-29 ENCOUNTER — Encounter: Payer: Self-pay | Admitting: Podiatry
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
## 2020-10-05 ENCOUNTER — Ambulatory Visit: Payer: Medicare Other | Admitting: Podiatry

## 2020-10-12 ENCOUNTER — Ambulatory Visit (INDEPENDENT_AMBULATORY_CARE_PROVIDER_SITE_OTHER): Payer: Medicare Other | Admitting: Podiatry

## 2020-10-12 ENCOUNTER — Other Ambulatory Visit: Payer: Self-pay

## 2020-10-12 DIAGNOSIS — L97521 Non-pressure chronic ulcer of other part of left foot limited to breakdown of skin: Secondary | ICD-10-CM

## 2020-10-12 DIAGNOSIS — E1142 Type 2 diabetes mellitus with diabetic polyneuropathy: Secondary | ICD-10-CM

## 2020-10-13 ENCOUNTER — Encounter: Payer: Self-pay | Admitting: Podiatry
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
## 2020-10-18 ENCOUNTER — Other Ambulatory Visit: Payer: Self-pay

## 2020-10-18 ENCOUNTER — Ambulatory Visit (INDEPENDENT_AMBULATORY_CARE_PROVIDER_SITE_OTHER): Payer: Medicare Other | Admitting: Internal Medicine

## 2020-10-18 ENCOUNTER — Ambulatory Visit: Payer: Self-pay | Admitting: Orthopedic Surgery

## 2020-10-18 ENCOUNTER — Encounter: Payer: Self-pay | Admitting: Internal Medicine

## 2020-10-18 VITALS — BP 128/88 | HR 97 | Ht 71.0 in | Wt 210.2 lb

## 2020-10-18 DIAGNOSIS — E663 Overweight: Secondary | ICD-10-CM

## 2020-10-18 DIAGNOSIS — E782 Mixed hyperlipidemia: Secondary | ICD-10-CM | POA: Diagnosis not present

## 2020-10-18 DIAGNOSIS — E1159 Type 2 diabetes mellitus with other circulatory complications: Secondary | ICD-10-CM

## 2020-10-18 DIAGNOSIS — E1165 Type 2 diabetes mellitus with hyperglycemia: Secondary | ICD-10-CM

## 2020-10-18 LAB — POCT GLYCOSYLATED HEMOGLOBIN (HGB A1C): Hemoglobin A1C: 7.8 % — AB (ref 4.0–5.6)
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
## 2020-10-18 NOTE — Patient Instructions (Addendum)
Please continue: - Tresiba 30 units daily - Ozempic 0.5 mg weekly  Restart checking sugars.  Please return in 3-4 months.

## 2020-10-24 NOTE — Pre-Procedure Instructions (Signed)
[place], [place] - [address] [place] [address] Phone: [phone] Fax: [phone]  Zola Button, [place] AT [facility] [address] [place] [address] Phone: [phone] Fax: [phone]      Your procedure is scheduled on Thursday, December 30th at 6:30a.m.  Report to [facility] Main Entrance "A" at 5:30 A.M., and check in at the Admitting office.  Call this number if you have problems the morning of surgery:  [phone]  Call [phone] if you have any questions prior to your surgery date Monday-Friday 8am-4pm    Remember:  Do not eat or drink after midnight the night before your surgery    Take these medicines the morning of surgery with A SIP OF WATER  amLODipine (NORVASC)  doxycycline (VIBRA-TABS)  metoprolol tartrate (LOPRESSOR)   Take these medications as needed the morning of surgery. HYDROcodone-acetaminophen (NORCO) meclizine (ANTIVERT)  methocarbamol (ROBAXIN) promethazine (PHENERGAN) nitroGLYCERIN (NITROSTAT)-please let a nurse know if you had to use this.   Follow your surgeon's instructions on when to stop/resume Aspirin and ticagrelor (BRILINTA).  If no instructions were given by your surgeon then you will need to call the office to get those instructions.    As of today, STOP taking any Aspirin (unless otherwise instructed by your surgeon) Aleve, Naproxen, Ibuprofen, Motrin, Advil, Goody's, BC's, all herbal medications, fish oil, and all vitamins.   WHAT DO I DO ABOUT MY DIABETES MEDICATION?    . THE MORNING OF SURGERY, take 15 units of insulin degludec (TRESIBA FLEXTOUCH) insulin.  . The day of surgery, do not take Semaglutide (Ames)  . If your CBG is greater than 220 mg/dL, you may take  of your sliding scale (correction) dose of insulin.   HOW TO MANAGE YOUR DIABETES BEFORE AND AFTER SURGERY  Why is it important to control my blood  sugar before and after surgery? . Improving blood sugar levels before and after surgery helps healing and can limit problems. . A way of improving blood sugar control is eating a healthy diet by: o  Eating less sugar and carbohydrates o  Increasing activity/exercise o  Talking with your doctor about reaching your blood sugar goals . High blood sugars (greater than 180 mg/dL) can raise your risk of infections and slow your recovery, so you will need to focus on controlling your diabetes during the weeks before surgery. . Make sure that the doctor who takes care of your diabetes knows about your planned surgery including the date and location.  How do I manage my blood sugar before surgery? . Check your blood sugar at least 4 times a day, starting 2 days before surgery, to make sure that the level is not too high or low. . Check your blood sugar the morning of your surgery when you wake up and every 2 hours until you get to the Short Stay unit. o If your blood sugar is less than 70 mg/dL, you will need to treat for low blood sugar: - Do not take insulin. - Treat a low blood sugar (less than 70 mg/dL) with  cup of clear juice (cranberry or apple), 4 glucose tablets, OR glucose gel. - Recheck blood sugar in 15 minutes after treatment (to make sure it is greater than 70 mg/dL). If your blood sugar is not greater than 70 mg/dL on recheck, call [phone] for further instructions. . Report your blood sugar to the short stay nurse when you  get to Short Stay.  . If you are admitted to the hospital after surgery: o Your blood sugar will be checked by the staff and you will probably be given insulin after surgery (instead of oral diabetes medicines) to make sure you have good blood sugar levels. o The goal for blood sugar control after surgery is 80-180 mg/dL.                      Do not wear jewelry.            Do not wear lotions, powders, colognes, or deodorant.            Men may shave face and  neck.            Do not bring valuables to the hospital.            [facility] is not responsible for any belongings or valuables.  Do NOT Smoke (Tobacco/Vaping) or drink Alcohol 24 hours prior to your procedure If you use a CPAP at night, you may bring all equipment for your overnight stay.   Contacts, glasses, dentures or bridgework may not be worn into surgery.      For patients admitted to the hospital, discharge time will be determined by your treatment team.   Patients discharged the day of surgery will not be allowed to drive home, and someone needs to stay with them for 24 hours.    Special instructions:   - Preparing For Surgery  Before surgery, you can play an important role. Because skin is not sterile, your skin needs to be as free of germs as possible. You can reduce the number of germs on your skin by washing with CHG (chlorahexidine gluconate) Soap before surgery.  CHG is an antiseptic cleaner which kills germs and bonds with the skin to continue killing germs even after washing.    Oral Hygiene is also important to reduce your risk of infection.  Remember - BRUSH YOUR TEETH THE MORNING OF SURGERY WITH YOUR REGULAR TOOTHPASTE  Please do not use if you have an allergy to CHG or antibacterial soaps. If your skin becomes reddened/irritated stop using the CHG.  Do not shave (including legs and underarms) for at least 48 hours prior to first CHG shower. It is OK to shave your face.  Please follow these instructions carefully.   1. Shower the NIGHT BEFORE SURGERY and the MORNING OF SURGERY with CHG Soap.   2. If you chose to wash your hair, wash your hair first as usual with your normal shampoo.  3. After you shampoo, rinse your hair and body thoroughly to remove the shampoo.  4. Use CHG as you would any other liquid soap. You can apply CHG directly to the skin and wash gently with a scrungie or a clean washcloth.   5. Apply the CHG Soap to your body ONLY FROM  THE NECK DOWN.  Do not use on open wounds or open sores. Avoid contact with your eyes, ears, mouth and genitals (private parts). Wash Face and genitals (private parts)  with your normal soap.   6. Wash thoroughly, paying special attention to the area where your surgery will be performed.  7. Thoroughly rinse your body with warm water from the neck down.  8. DO NOT shower/wash with your normal soap after using and rinsing off the CHG Soap.  9. Pat yourself dry with a CLEAN TOWEL.  10. Wear CLEAN PAJAMAS to bed  the night before surgery  11. Place CLEAN SHEETS on your bed the night of your first shower and DO NOT SLEEP WITH PETS.   Day of Surgery: Wear Clean/Comfortable clothing the morning of surgery Do not apply any deodorants/lotions.   Remember to brush your teeth WITH YOUR REGULAR TOOTHPASTE.   Please read over the following fact sheets that you were given.

## 2020-10-25 ENCOUNTER — Encounter (HOSPITAL_COMMUNITY): Payer: Self-pay

## 2020-10-25 ENCOUNTER — Encounter (HOSPITAL_COMMUNITY)
Admission: RE | Admit: 2020-10-25 | Discharge: 2020-10-25 | Disposition: A | Discharge status: Home / Self Care | Payer: Medicare Other | Source: Ambulatory Visit | Attending: Orthopedic Surgery | Admitting: Orthopedic Surgery

## 2020-10-25 ENCOUNTER — Encounter (HOSPITAL_BASED_OUTPATIENT_CLINIC_OR_DEPARTMENT_OTHER): Payer: Medicare Other | Attending: Internal Medicine | Admitting: Internal Medicine

## 2020-10-25 ENCOUNTER — Other Ambulatory Visit (HOSPITAL_COMMUNITY)
Admission: RE | Admit: 2020-10-25 | Discharge: 2020-10-25 | Disposition: A | Discharge status: Home / Self Care | Payer: Medicare Other | Source: Ambulatory Visit | Attending: Orthopedic Surgery | Admitting: Orthopedic Surgery

## 2020-10-25 ENCOUNTER — Other Ambulatory Visit: Payer: Self-pay

## 2020-10-25 DIAGNOSIS — Z833 Family history of diabetes mellitus: Secondary | ICD-10-CM | POA: Insufficient documentation

## 2020-10-25 DIAGNOSIS — E1122 Type 2 diabetes mellitus with diabetic chronic kidney disease: Secondary | ICD-10-CM | POA: Insufficient documentation

## 2020-10-25 DIAGNOSIS — J90 Pleural effusion, not elsewhere classified: Secondary | ICD-10-CM | POA: Diagnosis not present

## 2020-10-25 DIAGNOSIS — Z8249 Family history of ischemic heart disease and other diseases of the circulatory system: Secondary | ICD-10-CM | POA: Diagnosis not present

## 2020-10-25 DIAGNOSIS — E1142 Type 2 diabetes mellitus with diabetic polyneuropathy: Secondary | ICD-10-CM | POA: Insufficient documentation

## 2020-10-25 DIAGNOSIS — I2510 Atherosclerotic heart disease of native coronary artery without angina pectoris: Secondary | ICD-10-CM | POA: Insufficient documentation

## 2020-10-25 DIAGNOSIS — I110 Hypertensive heart disease with heart failure: Secondary | ICD-10-CM | POA: Insufficient documentation

## 2020-10-25 DIAGNOSIS — M5416 Radiculopathy, lumbar region: Secondary | ICD-10-CM | POA: Diagnosis not present

## 2020-10-25 DIAGNOSIS — N183 Chronic kidney disease, stage 3 unspecified: Secondary | ICD-10-CM | POA: Insufficient documentation

## 2020-10-25 DIAGNOSIS — Z7982 Long term (current) use of aspirin: Secondary | ICD-10-CM | POA: Insufficient documentation

## 2020-10-25 DIAGNOSIS — Z20822 Contact with and (suspected) exposure to covid-19: Secondary | ICD-10-CM | POA: Insufficient documentation

## 2020-10-25 DIAGNOSIS — I129 Hypertensive chronic kidney disease with stage 1 through stage 4 chronic kidney disease, or unspecified chronic kidney disease: Secondary | ICD-10-CM | POA: Diagnosis not present

## 2020-10-25 DIAGNOSIS — M5116 Intervertebral disc disorders with radiculopathy, lumbar region: Secondary | ICD-10-CM | POA: Diagnosis not present

## 2020-10-25 DIAGNOSIS — Z89429 Acquired absence of other toe(s), unspecified side: Secondary | ICD-10-CM | POA: Diagnosis not present

## 2020-10-25 DIAGNOSIS — I509 Heart failure, unspecified: Secondary | ICD-10-CM | POA: Diagnosis not present

## 2020-10-25 DIAGNOSIS — Z01818 Encounter for other preprocedural examination: Secondary | ICD-10-CM | POA: Insufficient documentation

## 2020-10-25 DIAGNOSIS — Z794 Long term (current) use of insulin: Secondary | ICD-10-CM | POA: Diagnosis not present

## 2020-10-25 DIAGNOSIS — Z7901 Long term (current) use of anticoagulants: Secondary | ICD-10-CM | POA: Insufficient documentation

## 2020-10-25 DIAGNOSIS — Z79899 Other long term (current) drug therapy: Secondary | ICD-10-CM | POA: Diagnosis not present

## 2020-10-25 DIAGNOSIS — L97522 Non-pressure chronic ulcer of other part of left foot with fat layer exposed: Secondary | ICD-10-CM | POA: Diagnosis not present

## 2020-10-25 DIAGNOSIS — F17210 Nicotine dependence, cigarettes, uncomplicated: Secondary | ICD-10-CM | POA: Insufficient documentation

## 2020-10-25 DIAGNOSIS — G9520 Unspecified cord compression: Secondary | ICD-10-CM | POA: Insufficient documentation

## 2020-10-25 DIAGNOSIS — I252 Old myocardial infarction: Secondary | ICD-10-CM | POA: Diagnosis not present

## 2020-10-25 DIAGNOSIS — E11621 Type 2 diabetes mellitus with foot ulcer: Secondary | ICD-10-CM | POA: Diagnosis not present

## 2020-10-25 LAB — CBC
HCT: 33.5 % — ABNORMAL LOW (ref 39.0–52.0)
Hemoglobin: 11.2 g/dL — ABNORMAL LOW (ref 13.0–17.0)
MCH: 31 pg (ref 26.0–34.0)
MCHC: 33.4 g/dL (ref 30.0–36.0)
MCV: 92.8 fL (ref 80.0–100.0)
Platelets: 278 10*3/uL (ref 150–400)
RBC: 3.61 MIL/uL — ABNORMAL LOW (ref 4.22–5.81)
RDW: 12.7 % (ref 11.5–15.5)
WBC: 9.4 10*3/uL (ref 4.0–10.5)
nRBC: 0 % (ref 0.0–0.2)

## 2020-10-25 LAB — PROTIME-INR
INR: 1.2 (ref 0.8–1.2)
Prothrombin Time: 14.3 seconds (ref 11.4–15.2)

## 2020-10-25 LAB — URINALYSIS, ROUTINE W REFLEX MICROSCOPIC
Bilirubin Urine: NEGATIVE
Glucose, UA: NEGATIVE mg/dL
Ketones, ur: NEGATIVE mg/dL
Leukocytes,Ua: NEGATIVE
Nitrite: NEGATIVE
Protein, ur: >=300 mg/dL — AB
Specific Gravity, Urine: 1.009 (ref 1.005–1.030)
pH: 5 (ref 5.0–8.0)

## 2020-10-25 LAB — BASIC METABOLIC PANEL
Anion gap: 7 (ref 5–15)
BUN: 36 mg/dL — ABNORMAL HIGH (ref 6–20)
CO2: 22 mmol/L (ref 22–32)
Calcium: 8.3 mg/dL — ABNORMAL LOW (ref 8.9–10.3)
Chloride: 108 mmol/L (ref 98–111)
Creatinine, Ser: 2.6 mg/dL — ABNORMAL HIGH (ref 0.61–1.24)
GFR, Estimated: 31 mL/min — ABNORMAL LOW (ref >60)
Glucose, Bld: 126 mg/dL — ABNORMAL HIGH (ref 70–99)
Potassium: 5 mmol/L (ref 3.5–5.1)
Sodium: 137 mmol/L (ref 135–145)

## 2020-10-25 LAB — SURGICAL PCR SCREEN
MRSA, PCR: NEGATIVE
Staphylococcus aureus: NEGATIVE

## 2020-10-25 LAB — APTT: aPTT: 31 seconds (ref 24–36)

## 2020-10-25 LAB — TYPE AND SCREEN
ABO/RH(D): B POS
Antibody Screen: NEGATIVE

## 2020-10-25 LAB — GLUCOSE, CAPILLARY: Glucose-Capillary: 133 mg/dL — ABNORMAL HIGH (ref 70–99)

## 2020-10-25 NOTE — Progress Notes (Addendum)
PCP - Dr. Luciana Axe  Cardiologist - Dr. Terrence Dupont  Endocrine- Dr. Wardell Heath  Chest x-ray - 10/25/20  EKG - 10/25/20  Stress Test - Denies  ECHO - 4/31/19 (E)  Cardiac Cath - 01/26/18 (E)  AICD-na PM-na LOOP-na  Sleep Study -Denies   CPAP - Denies  LABS- 10/25/20: CBC, BMP, PT, PTT, T/S, PCR, UA, COVID  ASA- LD- 12/24 Brillinta- LD- 12/24  ERAS- No  HA1C- 10/18/20: 7.8 (E) Fasting Blood Sugar - 97-240, today, 133 Checks Blood Sugar ___3-4__ times a day. Pt sts he uses a [employer]  Anesthesia- Yes- surgical order  Pt denies having chest pain, sob, or fever at this time. All instructions explained to the pt, with a verbal understanding of the material. Pt agrees to go over the instructions while at home for a better understanding. Pt also instructed to self quarantine after being tested for COVID-19. The opportunity to ask questions was provided.   Coronavirus Screening  Have you experienced the following symptoms:  Cough yes/no: No Fever (>100.74F)  yes/no: No Runny nose yes/no: No Sore throat yes/no: No Difficulty breathing/shortness of breath  yes/no: No  Have you or a family member traveled in the last 14 days and where? yes/no: No   If the patient indicates "YES" to the above questions, their PAT will be rescheduled to limit the exposure to others and, the surgeon will be notified. THE PATIENT WILL NEED TO BE ASYMPTOMATIC FOR 14 DAYS.   If the patient is not experiencing any of these symptoms, the PAT nurse will instruct them to NOT bring anyone with them to their appointment since they may have these symptoms or traveled as well.   Please remind your patients and families that hospital visitation restrictions are in effect and the importance of the restrictions.

## 2020-10-25 NOTE — Progress Notes (Signed)
Derrick, Mosley ([id number]) Visit Report for 10/25/2020 Abuse/Suicide Risk Screen Details Patient Name: Date of Service: Derrick Mosley Derrick L. 10/25/2020 1:15 PM Medical Record Number: [id number] Patient Account Number: [id number] Date of Birth/Sex: Treating RN: [birth year] (44 y.o. [name] Primary Care Gabe Glace: PRA CTICE, DA [place] Crane Other Clinician: Referring Monteen Toops: Treating Teyla Skidgel/Extender: Linton Ham PRA CTICE, DA [place] Crane Weeks in Treatment: 0 Abuse/Suicide Risk Screen Items Answer ABUSE RISK SCREEN: Has anyone close to you tried to hurt or harm you recentlyo No Do you feel uncomfortable with anyone in your familyo No Has anyone forced you do things that you didnt want to doo No Electronic Signature(s) Signed: 10/25/2020 6:26:47 PM By: [name] Entered By: [name] on 10/25/2020 13:18:32 -------------------------------------------------------------------------------- Activities of Daily Living Details Patient Name: Date of Service: Derrick Mosley Derrick L. 10/25/2020 1:15 PM Medical Record Number: [id number] Patient Account Number: [id number] Date of Birth/Sex: Treating RN: 09/15/1976 (44 y.o. [name] Primary Care Amoree Newlon: PRA CTICE, DA [place] Crane Other Clinician: Referring Sun Kihn: Treating [employer]/Extender: Linton Ham PRA CTICE, DA [place] Crane Weeks in Treatment: 0 Activities of Daily Living Items Answer Activities of Daily Living (Please select one for each item) Drive Automobile Completely Able T Medications ake Completely Able Use T elephone Completely Able Care for Appearance Completely Able Use T oilet Completely Able Bath / Shower Completely Able Dress Self Completely Able Feed Self Completely Able Walk Completely Able Get In / Out Bed Completely Able Housework Completely Able Prepare Meals Completely Able Handle Money Completely Able Shop for Self Completely Able Electronic Signature(s) Signed: 10/25/2020 6:26:47 PM By:  [name] Entered By: [name] on 10/25/2020 13:18:48 -------------------------------------------------------------------------------- Education Screening Details Patient Name: Date of Service: Derrick Mosley, Derrick Mosley Derrick L. 10/25/2020 1:15 PM Medical Record Number: [id number] Patient Account Number: [id number] Date of Birth/Sex: Treating RN: 1976/06/17 (44 y.o. [name] Primary Care Yulian Gosney: PRA CTICE, DA [place] Crane Other Clinician: Referring Breshae Belcher: Treating Stran Raper/Extender: Linton Ham PRA CTICE, DA Judge Stall in Treatment: 0 Primary Learner Assessed: Patient Learning Preferences/Education Level/Primary Language Learning Preference: Explanation, Demonstration, Printed Material Highest Education Level: High School Preferred Language: English Cognitive Barrier Language Barrier: No Translator Needed: No Memory Deficit: No Emotional Barrier: No Cultural/Religious Beliefs Affecting Medical Care: No Physical Barrier Impaired Vision: Yes Glasses Impaired Hearing: No Decreased Hand dexterity: No Knowledge/Comprehension Knowledge Level: High Comprehension Level: High Ability to understand written instructions: High Ability to understand verbal instructions: High Motivation Anxiety Level: Calm Cooperation: Cooperative Education Importance: Acknowledges Need Interest in Health Problems: Asks Questions Perception: Coherent Willingness to Engage in Self-Management High Activities: Readiness to Engage in Self-Management High Activities: Electronic Signature(s) Signed: 10/25/2020 6:26:47 PM By: [name] Entered By: [name] on 10/25/2020 13:19:11 -------------------------------------------------------------------------------- Fall Risk Assessment Details Patient Name: Date of Service: Derrick Mosley, Derrick Mosley Derrick L. 10/25/2020 1:15 PM Medical Record Number: [id number] Patient Account Number: [id number] Date of Birth/Sex: Treating RN: 10-Nov-1975 (44 y.o. [name] Primary Care Kolbi Tofte: PRA CTICE, DA [place] Crane Other Clinician: Referring Remo Kirschenmann: Treating Mendy Lapinsky/Extender: Linton Ham PRA CTICE, DA [place] Crane Weeks in Treatment: 0 Fall Risk Assessment Items Have you had 2 or more falls in the last 12 monthso 0 Yes Have you had any fall that resulted in injury in the last 12 monthso 0 No FALLS RISK SCREEN History of falling - immediate or within 3 months 0 No Secondary diagnosis (Do you have 2 or more medical diagnoseso) 0 No Ambulatory aid None/bed rest/wheelchair/nurse 0 Yes Crutches/cane/walker 0 No Furniture 0 No Intravenous therapy  Access/Saline/Heparin Lock 0 No Gait/Transferring Normal/ bed rest/ wheelchair 0 Yes Weak (short steps with or without shuffle, stooped but able to lift head while walking, may seek 0 No support from furniture) Impaired (short steps with shuffle, may have difficulty arising from chair, head down, impaired 0 No balance) Mental Status Oriented to own ability 0 Yes Electronic Signature(s) Signed: 10/25/2020 6:26:47 PM By: [name] Entered By: [name] on 10/25/2020 13:19:31 -------------------------------------------------------------------------------- Foot Assessment Details Patient Name: Date of Service: Derrick Mosley, Derrick Mosley Derrick L. 10/25/2020 1:15 PM Medical Record Number: [id number] Patient Account Number: [id number] Date of Birth/Sex: Treating RN: 1976/03/10 (44 y.o. [name] Primary Care Mattye Verdone: PRA CTICE, DA [place] Crane Other Clinician: Referring Kaysin Brock: Treating Kirbi Farrugia/Extender: Linton Ham PRA CTICE, DA [place] Crane Weeks in Treatment: 0 Foot Assessment Items Site Locations + = Sensation present, - = Sensation absent, C = Callus, U = Ulcer R = Redness, W = Warmth, M = Maceration, PU = Pre-ulcerative lesion F = Fissure, S = Swelling, D = Dryness Assessment Right: Left: Other Deformity: No No Prior Foot Ulcer: No No Prior Amputation: No Yes Charcot Joint: No  No Ambulatory Status: Ambulatory Without Help Gait: Steady Electronic Signature(s) Signed: 10/25/2020 6:26:47 PM By: [name] Entered By: [name] on 10/25/2020 13:35:53 -------------------------------------------------------------------------------- Nutrition Risk Screening Details Patient Name: Date of ServiceJulieta Mosley Derrick L. 10/25/2020 1:15 PM Medical Record Number: [id number] Patient Account Number: [id number] Date of Birth/Sex: Treating RN: 23-Mar-1976 (44 y.o. [name] Primary Care Philana Younis: PRA CTICE, DA [place] Crane Other Clinician: Referring Ragan Reale: Treating Skyleen Bentley/Extender: Linton Ham PRA CTICE, DA [place] Crane Weeks in Treatment: 0 Height (in): 70 Weight (lbs): 213 Body Mass Index (BMI): 30.6 Nutrition Risk Screening Items Score Screening NUTRITION RISK SCREEN: I have an illness or condition that made me change the kind and/or amount of food I eat 2 Yes I eat fewer than two meals per day 0 No I eat few fruits and vegetables, or milk products 0 No I have three or more drinks of beer, liquor or wine almost every day 0 No I have tooth or mouth problems that make it hard for me to eat 0 No I don't always have enough money to buy the food I need 0 No I eat alone most of the time 0 No I take three or more different prescribed or over-the-counter drugs a day 1 Yes Without wanting to, I have lost or gained 10 pounds in the last six months 0 No I am not always physically able to shop, cook and/or feed myself 0 No Nutrition Protocols Good Risk Protocol Provide education on elevated blood Moderate Risk Protocol 0 sugars and impact on wound healing, as applicable High Risk Proctocol Risk Level: Moderate Risk Score: 3 Electronic Signature(s) Signed: 10/25/2020 6:26:47 PM By: [name] Entered By: [name] on 10/25/2020 13:19:42

## 2020-10-26 LAB — SARS CORONAVIRUS 2 (TAT 6-24 HRS): SARS Coronavirus 2: NEGATIVE
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
## 2020-10-26 NOTE — Anesthesia Preprocedure Evaluation (Addendum)
Anesthesia Evaluation  Patient identified by MRN, date of birth, ID band Patient awake    Reviewed: Allergy & Precautions, NPO status , Patient's Chart, lab work & pertinent test results, reviewed documented beta blocker date and time   History of Anesthesia Complications Negative for: history of anesthetic complications  Airway Mallampati: II  TM Distance: >3 FB Neck ROM: Full    Dental  (+) Dental Advisory Given   Pulmonary COPD, Current SmokerPatient did not abstain from smoking.,  10/25/2020 SARS coronavirus NEG   breath sounds clear to auscultation       Cardiovascular hypertension, Pt. on medications and Pt. on home beta blockers (-) angina+ CAD, + Past MI, + Cardiac Stents and + Peripheral Vascular Disease   Rhythm:Regular Rate:Normal  02/2020 ECHO: EF 50%, mildly depressed LVF, mild MR   Neuro/Psych  Headaches, Chronic back pain    GI/Hepatic negative GI ROS, Neg liver ROS,   Endo/Other  diabetes (glu 170), Insulin Dependent  Renal/GU Renal InsufficiencyRenal disease (creat 2.6)     Musculoskeletal   Abdominal (+) + obese,   Peds  Hematology  (+) Blood dyscrasia (Hb 11.2), anemia , brilinta   Anesthesia Other Findings   Reproductive/Obstetrics                           Anesthesia Physical Anesthesia Plan  ASA: III  Anesthesia Plan: General   Post-op Pain Management:    Induction: Intravenous  PONV Risk Score and Plan: 2 and Ondansetron and Dexamethasone  Airway Management Planned: Oral ETT  Additional Equipment: None  Intra-op Plan:   Post-operative Plan: Extubation in OR  Informed Consent: I have reviewed the patients History and Physical, chart, labs and discussed the procedure including the risks, benefits and alternatives for the proposed anesthesia with the patient or authorized representative who has indicated his/her understanding and acceptance.     Dental  advisory given  Plan Discussed with: CRNA and Surgeon  Anesthesia Plan Comments: (PAT note written 10/26/2020 by Myra Gianotti, PA-C. )      Anesthesia Quick Evaluation

## 2020-10-26 NOTE — Progress Notes (Signed)
Derrick Mosley ([id number]) Visit Report for 10/25/2020 Allergy List Details Patient Name: Date of Service: Derrick Mosley [name] 10/25/2020 1:15 PM Medical Record Number: [id number] Patient Account Number: [id number] Date of Birth/Sex: Treating RN: [birth year] (44 y.o. [name] Primary Care Azari Hasler: PRA CTICE, DA [place] Crane Other Clinician: Referring Capucine Tryon: Treating Khameron Gruenwald/Extender: Linton Ham PRA CTICE, DA [place] Crane Weeks in Treatment: 0 Allergies Active Allergies No Known Drug Allergies Type: Allergen Allergy Notes Electronic Signature(s) Signed: 10/25/2020 6:26:47 PM By: [name] Entered By: [name] on 10/25/2020 13:09:09 -------------------------------------------------------------------------------- Arrival Information Details Patient Name: Date of Service: Derrick WN, [name] 10/25/2020 1:15 PM Medical Record Number: [id number] Patient Account Number: [id number] Date of Birth/Sex: Treating RN: 1976-08-08 (44 y.o. [name] Primary Care Rosan Calbert: PRA CTICE, DA [place] Crane Other Clinician: Referring Jaylee Freeze: Treating Kenishia Plack/Extender: Linton Ham PRA CTICE, DA Judge Stall in Treatment: 0 Visit Information Patient Arrived: Ambulatory Arrival Time: 13:00 Accompanied By: self Transfer Assistance: None Patient Identification Verified: Yes Secondary Verification Process Completed: Yes Patient Requires Transmission-Based Precautions: No Patient Has Alerts: Yes Patient Alerts: Patient on Blood Thinner 05/2020 ABI:1.11 TBI1.08 Electronic Signature(s) Signed: 10/25/2020 6:26:47 PM By: [name] Entered By: [name] on 10/25/2020 13:08:57 -------------------------------------------------------------------------------- Clinic Level of Care Assessment Details Patient Name: Date of Service: Derrick Mosley [name] 10/25/2020 1:15 PM Medical Record Number: [id number] Patient Account Number: [id number] Date of Birth/Sex: Treating  RN: [birth year] (44 y.o. [name] Primary Care Tamyka Bezio: PRA CTICE, DA [place] Crane Other Clinician: Referring Masayuki Sakai: Treating Gian Ybarra/Extender: Linton Ham PRA CTICE, DA [place] Cr[place] Weeks in Treatment: 0 Clinic Level of Care Assessment Items TOOL 1 Quantity Score []  - 0 Use when EandM and Procedure is performed on INITIAL visit ASSESSMENTS - Nursing Assessment / Reassessment X- 1 20 General Physical Exam (combine w/ comprehensive assessment (listed just below) when performed on new pt. evals) X- 1 25 Comprehensive Assessment (HX, ROS, Risk Assessments, Wounds Hx, etc.) ASSESSMENTS - Wound and Skin Assessment / Reassessment []  - 0 Dermatologic / Skin Assessment (not related to wound area) ASSESSMENTS - Ostomy and/or Continence Assessment and Care []  - 0 Incontinence Assessment and Management []  - 0 Ostomy Care Assessment and Management (repouching, etc.) PROCESS - Coordination of Care X - Simple Patient / Family Education for ongoing care 1 15 []  - 0 Complex (extensive) Patient / Family Education for ongoing care X- 1 10 Staff obtains Programmer, systems, Records, T Results / Process Orders est []  - 0 Staff telephones HHA, Nursing Homes / Clarify orders / etc []  - 0 Routine Transfer to another Facility (non-emergent condition) []  - 0 Routine Hospital Admission (non-emergent condition) X- 1 15 New Admissions / Biomedical engineer / Ordering NPWT Apligraf, etc. , []  - 0 Emergency Hospital Admission (emergent condition) PROCESS - Special Needs []  - 0 Pediatric / Minor Patient Management []  - 0 Isolation Patient Management []  - 0 Hearing / Language / Visual special needs []  - 0 Assessment of Community assistance (transportation, D/C planning, etc.) []  - 0 Additional assistance / Altered mentation []  - 0 Support Surface(s) Assessment (bed, cushion, seat, etc.) INTERVENTIONS - Miscellaneous []  - 0 External ear exam []  - 0 Patient Transfer (multiple staff / Librarian, academic / Similar devices) []  - 0 Simple Staple / Suture removal (25 or less) []  - 0 Complex Staple / Suture removal (26 or more) []  - 0 Hypo/Hyperglycemic Management (do not check if billed separately) []  - 0 Ankle / Brachial Index (ABI) - do not check if billed separately  Has the patient been seen at the hospital within the last three years: Yes Total Score: 85 Level Of Care: New/Established - Level 3 Electronic Signature(s) Signed: 10/25/2020 6:35:32 PM By: Baruch Gouty RN, BSN Entered By: Baruch Gouty on 10/25/2020 14:10:23 -------------------------------------------------------------------------------- Encounter Discharge Information Details Patient Name: Date of Service: Derrick WN, [name] 10/25/2020 1:15 PM Medical Record Number: [id number] Patient Account Number: [id number] Date of Birth/Sex: Treating RN: 06/23/1976 (44 y.o. [name] Primary Care Ariona Deschene: PRA CTICE, DA [place] Crane Other Clinician: Referring Kane Kusek: Treating Ameyah Bangura/Extender: Linton Ham PRA CTICE, DA Judge Stall in Treatment: 0 Encounter Discharge Information Items Post Procedure Vitals Discharge Condition: Stable Temperature (F): 98.6 Ambulatory Status: Wheelchair Pulse (bpm): 90 Discharge Destination: Home Respiratory Rate (breaths/min): 16 Transportation: Private Auto Blood Pressure (mmHg): 122/82 Accompanied By: caregiver Schedule Follow-up Appointment: Yes Clinical Summary of Care: Electronic Signature(s) Signed: 10/25/2020 6:26:47 PM By: [name] Entered By: [name] on 10/25/2020 18:14:51 -------------------------------------------------------------------------------- Lower Extremity Assessment Details Patient Name: Date of Service: Derrick Mosley [name] 10/25/2020 1:15 PM Medical Record Number: [id number] Patient Account Number: [id number] Date of Birth/Sex: Treating RN: [birth year] (44 y.o. [name] Primary Care Jeanne Terrance: PRA CTICE, DA  [place] Crane Other Clinician: Referring Harrison Zetina: Treating Daishaun Ayre/Extender: Linton Ham PRA CTICE, DA [place] Crane Weeks in Treatment: 0 Edema Assessment Assessed: [Left: Yes] [Right: No] Edema: [Left: Ye] [Right: s] Calf Left: Right: Point of Measurement: 35 cm From Medial Instep 38.5 cm Ankle Left: Right: Point of Measurement: 10 cm From Medial Instep 25.5 cm Knee To Floor Left: Right: From Medial Instep 45 cm Vascular Assessment Pulses: Dorsalis Pedis Palpable: [Left:Yes] Posterior Tibial Palpable: [Left:Yes] Notes ABI performed outside of clinic. Noted in arrival tab. Electronic Signature(s) Signed: 10/25/2020 6:26:47 PM By: [name] Entered By: [name] on 10/25/2020 13:36:31 -------------------------------------------------------------------------------- Multi Wound Chart Details Patient Name: Date of Service: [name], [name] 10/25/2020 1:15 PM Medical Record Number: [id number] Patient Account Number: [id number] Date of Birth/Sex: Treating RN: [birth year] (44 y.o. [name] Primary Care Makaylie Dedeaux: PRA CTICE, DA [place] Crane Other Clinician: Referring Hau Sanor: Treating Rahman Ferrall/Extender: Linton Ham PRA CTICE, DA [place] Cr[place] Weeks in Treatment: 0 Vital Signs Height(in): 70 Capillary Blood Glucose(mg/dl): 133 Weight(lbs): 213 Pulse(bpm): 90 Body Mass Index(BMI): 31 Blood Pressure(mmHg): 122/82 Temperature(F): 98.6 Respiratory Rate(breaths/min): 16 Photos: [1:No Photos Left T Great oe] [N/A:N/A N/A] Wound Location: [1:Trauma] [N/A:N/A] Wounding Event: [1:Diabetic Wound/Ulcer of the Lower] [N/A:N/A] Primary Etiology: [1:Extremity Congestive Heart Failure, Coronary N/A] Comorbid History: [1:Artery Disease, Hypertension, Myocardial Infarction, Type II Diabetes, Osteomyelitis, Neuropathy 01/28/2020] [N/A:N/A] Date Acquired: [1:0] [N/A:N/A] Weeks of Treatment: [1:Open] [N/A:N/A] Wound Status: [1:0.2x0.4x0.5] [N/A:N/A] Measurements L x W x D (cm)  [1:0.063] [N/A:N/A] A (cm) : rea [1:0.031] [N/A:N/A] Volume (cm) : [1:12] Starting Position 1 (o'clock): [1:12] Ending Position 1 (o'clock): [1:0.6] Maximum Distance 1 (cm): [1:Yes] [N/A:N/A] Undermining: [1:Grade 2] [N/A:N/A] Classification: [1:Medium] [N/A:N/A] Exudate A mount: [1:Serosanguineous] [N/A:N/A] Exudate Type: [1:red, [place]] [N/A:N/A] Exudate Color: [1:Distinct, outline attached] [N/A:N/A] Wound Margin: [1:Large (67-100%)] [N/A:N/A] Granulation A mount: [1:Pink, Pale] [N/A:N/A] Granulation Quality: [1:Small (1-33%)] [N/A:N/A] Necrotic A mount: [1:Fat Layer (Subcutaneous Tissue): Yes N/A] Exposed Structures: [1:Fascia: No Tendon: No Muscle: No Joint: No Bone: No None] [N/A:N/A] Epithelialization: [1:Debridement - Excisional] [N/A:N/A] Debridement: Pre-procedure Verification/Time Out 14:05 [N/A:N/A] Taken: [1:Lidocaine 5% topical ointment] [N/A:N/A] Pain Control: [1:Callus, Subcutaneous, Slough] [N/A:N/A] Tissue Debrided: [1:Skin/Subcutaneous Tissue] [N/A:N/A] Level: [1:3.61] [N/A:N/A] Debridement A (sq cm): [1:rea Blade, Forceps] [N/A:N/A] Instrument: [1:Minimum] [N/A:N/A] Bleeding: [1:Silver Nitrate] [N/A:N/A] Hemostasis A chieved: [1:0] [N/A:N/A] Procedural Pain: [1:0] [  N/A:N/A] Post Procedural Pain: [1:Procedure was tolerated well] [N/A:N/A] Debridement Treatment Response: [1:0.8x1x0.2] [N/A:N/A] Post Debridement Measurements L x W x D (cm) [1:0.126] [N/A:N/A] Post Debridement Volume: (cm) [1:Debridement] [N/A:N/A] Treatment Notes Electronic Signature(s) Signed: 10/25/2020 6:35:32 PM By: Baruch Gouty RN, BSN Signed: 10/26/2020 12:07:59 PM By: Linton Ham MD Entered By: Linton Ham on 10/25/2020 14:18:50 -------------------------------------------------------------------------------- Multi-Disciplinary Care Plan Details Patient Name: Date of Service: [name], [name] 10/25/2020 1:15 PM Medical Record Number: [id number] Patient Account  Number: [id number] Date of Birth/Sex: Treating RN: 02-02-1976 (44 y.o. [name] Primary Care Livio Ledwith: PRA CTICE, DA [place] Crane Other Clinician: Referring Randa Riss: Treating Elina Streng/Extender: Linton Ham PRA CTICE, DA Judge Stall in Treatment: 0 Active Inactive Nutrition Nursing Diagnoses: Impaired glucose control: actual or potential Potential for alteratiion in Nutrition/Potential for imbalanced nutrition Goals: Patient/caregiver will maintain therapeutic glucose control Date Initiated: 10/25/2020 Target Resolution Date: 11/22/2020 Goal Status: Active Interventions: Assess patient nutrition upon admission and as needed per policy Treatment Activities: Patient referred to Primary Care Physician for further nutritional evaluation : 10/25/2020 Notes: Wound/Skin Impairment Nursing Diagnoses: Impaired tissue integrity Knowledge deficit related to ulceration/compromised skin integrity Goals: Patient/caregiver will verbalize understanding of skin care regimen Date Initiated: 10/25/2020 Target Resolution Date: 11/22/2020 Goal Status: Active Ulcer/skin breakdown will have a volume reduction of 30% by week 4 Date Initiated: 10/25/2020 Target Resolution Date: 11/22/2020 Goal Status: Active Interventions: Assess patient/caregiver ability to obtain necessary supplies Assess patient/caregiver ability to perform ulcer/skin care regimen upon admission and as needed Assess ulceration(s) every visit Provide education on smoking Treatment Activities: Skin care regimen initiated : 10/25/2020 Topical wound management initiated : 10/25/2020 Notes: Electronic Signature(s) Signed: 10/25/2020 6:35:32 PM By: Baruch Gouty RN, BSN Entered By: Baruch Gouty on 10/25/2020 14:07:58 -------------------------------------------------------------------------------- Pain Assessment Details Patient Name: Date of Service: [name], [name] 10/25/2020 1:15 PM Medical Record Number:  [id number] Patient Account Number: [id number] Date of Birth/Sex: Treating RN: [birth year] (44 y.o. [name] Primary Care Atiyah Bauer: PRA CTICE, DA [place] Crane Other Clinician: Referring Harry Bark: Treating Chrishonda Hesch/Extender: Linton Ham PRA CTICE, DA [place] Cr[place] Weeks in Treatment: 0 Active Problems Location of Pain Severity and Description of Pain Patient Has Paino No Site Locations Rate the pain. Current Pain Level: 0 Pain Management and Medication Current Pain Management: Medication: No Cold Application: No Rest: No Massage: No Activity: No T.E.N.S.: No Heat Application: No Leg drop or elevation: No Is the Current Pain Management Adequate: Adequate How does your wound impact your activities of daily livingo Sleep: No Bathing: No Appetite: No Relationship With Others: No Bladder Continence: No Emotions: No Bowel Continence: No Work: No Toileting: No Drive: No Dressing: No Hobbies: No Electronic Signature(s) Signed: 10/25/2020 6:26:47 PM By: [name] Entered By: [name] on 10/25/2020 13:19:54 -------------------------------------------------------------------------------- Patient/Caregiver Education Details Patient Name: Date of Service: [name], [name] 12/28/2021andnbsp1:15 PM Medical Record Number: [id number] Patient Account Number: [id number] Date of Birth/Gender: Treating RN: 09/20/1976 (44 y.o. [name] Primary Care Physician: PRA CTICE, DA [place] Crane Other Clinician: Referring Physician: Treating Physician/Extender: Linton Ham PRA CTICE, DA Judge Stall in Treatment: 0 Education Assessment Education Provided To: Patient Education Topics Provided Elevated Blood Sugar/ Impact on Healing: Handouts: Elevated Blood Sugars: How Do They Affect Wound Healing Methods: Explain/Verbal, Printed Responses: Reinforcements needed, State content correctly Offloading: Handouts: What is Offloadingo Methods: Explain/Verbal,  Printed Responses: Reinforcements needed, State content correctly Smoking and Wound Healing: Methods: Explain/Verbal Responses: Reinforcements needed, State content correctly Welcome T The [place]: o Handouts: Welcome T  [facility] [place] o Methods: Explain/Verbal, Printed Responses: Reinforcements needed, State content correctly Wound/Skin Impairment: Handouts: Smoking and Wound Healing Methods: Explain/Verbal, Printed Responses: Reinforcements needed, State content correctly Electronic Signature(s) Signed: 10/25/2020 6:35:32 PM By: Baruch Gouty RN, BSN Entered By: Baruch Gouty on 10/25/2020 14:09:13 -------------------------------------------------------------------------------- Wound Assessment Details Patient Name: Date of Service: Derrick WN, [name] 10/25/2020 1:15 PM Medical Record Number: [id number] Patient Account Number: [id number] Date of Birth/Sex: Treating RN: 05-30-1976 (44 y.o. [name] Primary Care Suzetta Timko: PRA CTICE, DA [place] Crane Other Clinician: Referring Kimberl Vig: Treating Brynlyn Dade/Extender: Linton Ham PRA CTICE, DA [place] Crane Weeks in Treatment: 0 Wound Status Wound Number: 1 Primary Diabetic Wound/Ulcer of the Lower Extremity Etiology: Wound Location: Left T Great oe Wound Open Wounding Event: Trauma Status: Date Acquired: 01/28/2020 Comorbid Congestive Heart Failure, Coronary Artery Disease, Hypertension, Weeks Of Treatment: 0 History: Myocardial Infarction, Type II Diabetes, Osteomyelitis, Neuropathy Clustered Wound: No Wound Measurements Length: (cm) 0.2 Width: (cm) 0.4 Depth: (cm) 0.5 Area: (cm) 0.063 Volume: (cm) 0.031 % Reduction in Area: % Reduction in Volume: Epithelialization: None Tunneling: No Undermining: Yes Starting Position (o'clock): 12 Ending Position (o'clock): 12 Maximum Distance: (cm) 0.6 Wound Description Classification: Grade 2 Wound Margin: Distinct, outline attached Exudate Amount:  Medium Exudate Type: Serosanguineous Exudate Color: red, Estell Foul Odor After Cleansing: No Slough/Fibrino Yes Wound Bed Granulation Amount: Large (67-100%) Exposed Structure Granulation Quality: Pink, Pale Fascia Exposed: No Necrotic Amount: Small (1-33%) Fat Layer (Subcutaneous Tissue) Exposed: Yes Necrotic Quality: Adherent Slough Tendon Exposed: No Muscle Exposed: No Joint Exposed: No Bone Exposed: No Treatment Notes Wound #1 (Toe Great) Wound Laterality: Left Cleanser Normal Saline Discharge Instruction: Cleanse the wound with Normal Saline prior to applying a clean dressing using gauze sponges, not tissue or cotton balls. Peri-Wound Care Topical Primary Dressing Promogran Prisma Matrix, 4.34 (sq in) (silver collagen) Discharge Instruction: Moisten collagen with saline or hydrogel Secondary Dressing Woven Gauze Sponges 2x2 in Discharge Instruction: Apply over primary dressing as directed. Optifoam Non-Adhesive Dressing, 4x4 in Discharge Instruction: Apply over primary dressing cut to make foam donut to help offload Secured With Conforming Stretch Gauze Bandage, Sterile 2x75 (in/in) Discharge Instruction: Secure with stretch gauze as directed. Paper Tape, 1x10 (in/yd) Discharge Instruction: Secure dressing with tape as directed. Compression Wrap Compression Stockings Add-Ons Electronic Signature(s) Signed: 10/25/2020 6:26:47 PM By: [name] Entered By: [name] on 10/25/2020 13:37:55 -------------------------------------------------------------------------------- Vitals Details Patient Name: Date of Service: Derrick WN, [name] 10/25/2020 1:15 PM Medical Record Number: [id number] Patient Account Number: [id number] Date of Birth/Sex: Treating RN: 1976/10/25 (44 y.o. [name] Primary Care Nayana Lenig: PRA CTICE, DA [place] Crane Other Clinician: Referring Sonika Levins: Treating Tradarius Reinwald/Extender: Linton Ham PRA CTICE, DA [place] Cr[place] Weeks in Treatment:  0 Vital Signs Time Taken: 13:17 Temperature (F): 98.6 Height (in): 70 Pulse (bpm): 90 Source: Stated Respiratory Rate (breaths/min): 16 Weight (lbs): 213 Blood Pressure (mmHg): 122/82 Source: Stated Capillary Blood Glucose (mg/dl): 133 Body Mass Index (BMI): 30.6 Reference Range: 80 - 120 mg / dl Notes LAST HgbA1c 7.2. Electronic Signature(s) Signed: 10/25/2020 6:26:47 PM By: [name] Entered By: [name] on 10/25/2020 13:18:19

## 2020-10-27 ENCOUNTER — Inpatient Hospital Stay (HOSPITAL_COMMUNITY): Payer: Medicare Other | Admitting: Vascular Surgery

## 2020-10-27 ENCOUNTER — Observation Stay (HOSPITAL_COMMUNITY)
Admission: RE | Admit: 2020-10-27 | Discharge: 2020-10-28 | Disposition: A | Discharge status: Home / Self Care | Payer: Medicare Other | Attending: Orthopedic Surgery | Admitting: Orthopedic Surgery

## 2020-10-27 ENCOUNTER — Other Ambulatory Visit: Payer: Self-pay

## 2020-10-27 ENCOUNTER — Inpatient Hospital Stay (HOSPITAL_COMMUNITY): Payer: Medicare Other

## 2020-10-27 ENCOUNTER — Encounter (HOSPITAL_COMMUNITY)
Admission: RE | Disposition: A | Discharge status: Home / Self Care | Payer: Self-pay | Source: Home / Self Care | Attending: Orthopedic Surgery

## 2020-10-27 ENCOUNTER — Encounter (HOSPITAL_COMMUNITY): Payer: Self-pay | Admitting: Orthopedic Surgery

## 2020-10-27 ENCOUNTER — Inpatient Hospital Stay (HOSPITAL_COMMUNITY): Payer: Medicare Other | Admitting: Anesthesiology

## 2020-10-27 DIAGNOSIS — I129 Hypertensive chronic kidney disease with stage 1 through stage 4 chronic kidney disease, or unspecified chronic kidney disease: Secondary | ICD-10-CM | POA: Insufficient documentation

## 2020-10-27 DIAGNOSIS — M5116 Intervertebral disc disorders with radiculopathy, lumbar region: Secondary | ICD-10-CM | POA: Diagnosis not present

## 2020-10-27 DIAGNOSIS — I2510 Atherosclerotic heart disease of native coronary artery without angina pectoris: Secondary | ICD-10-CM | POA: Diagnosis not present

## 2020-10-27 DIAGNOSIS — E785 Hyperlipidemia, unspecified: Secondary | ICD-10-CM | POA: Diagnosis not present

## 2020-10-27 DIAGNOSIS — M5416 Radiculopathy, lumbar region: Secondary | ICD-10-CM | POA: Diagnosis present

## 2020-10-27 DIAGNOSIS — M48061 Spinal stenosis, lumbar region without neurogenic claudication: Secondary | ICD-10-CM | POA: Insufficient documentation

## 2020-10-27 DIAGNOSIS — M4326 Fusion of spine, lumbar region: Secondary | ICD-10-CM | POA: Diagnosis not present

## 2020-10-27 DIAGNOSIS — Z419 Encounter for procedure for purposes other than remedying health state, unspecified: Secondary | ICD-10-CM

## 2020-10-27 DIAGNOSIS — Z794 Long term (current) use of insulin: Secondary | ICD-10-CM | POA: Diagnosis not present

## 2020-10-27 DIAGNOSIS — N183 Chronic kidney disease, stage 3 unspecified: Secondary | ICD-10-CM | POA: Insufficient documentation

## 2020-10-27 DIAGNOSIS — E871 Hypo-osmolality and hyponatremia: Secondary | ICD-10-CM | POA: Diagnosis not present

## 2020-10-27 DIAGNOSIS — E119 Type 2 diabetes mellitus without complications: Secondary | ICD-10-CM | POA: Insufficient documentation

## 2020-10-27 DIAGNOSIS — M5126 Other intervertebral disc displacement, lumbar region: Secondary | ICD-10-CM | POA: Diagnosis not present

## 2020-10-27 DIAGNOSIS — E875 Hyperkalemia: Secondary | ICD-10-CM | POA: Diagnosis not present

## 2020-10-27 DIAGNOSIS — Z79899 Other long term (current) drug therapy: Secondary | ICD-10-CM | POA: Insufficient documentation

## 2020-10-27 DIAGNOSIS — Z7982 Long term (current) use of aspirin: Secondary | ICD-10-CM | POA: Insufficient documentation

## 2020-10-27 HISTORY — PX: TRANSFORAMINAL LUMBAR INTERBODY FUSION (TLIF) WITH PEDICLE SCREW FIXATION 1 LEVEL: SHX6141

## 2020-10-27 LAB — GLUCOSE, CAPILLARY
Glucose-Capillary: 170 mg/dL — ABNORMAL HIGH (ref 70–99)
Glucose-Capillary: 152 mg/dL — ABNORMAL HIGH (ref 70–99)
Glucose-Capillary: 287 mg/dL — ABNORMAL HIGH (ref 70–99)
Glucose-Capillary: 300 mg/dL — ABNORMAL HIGH (ref 70–99)

## 2020-10-27 LAB — ABO/RH: ABO/RH(D): B POS

## 2020-10-27 SURGERY — TRANSFORAMINAL LUMBAR INTERBODY FUSION (TLIF) WITH PEDICLE SCREW FIXATION 1 LEVEL
Anesthesia: General | Site: Spine Lumbar

## 2020-10-27 MED ORDER — FREESTYLE LIBRE 14 DAY SENSOR MISC: 1.0000 | Status: DC

## 2020-10-27 MED ORDER — ONDANSETRON HCL 4 MG/2ML IJ SOLN
INTRAMUSCULAR | Status: DC | PRN
  Administered 2020-10-27: 4 mg via INTRAVENOUS

## 2020-10-27 MED ORDER — SODIUM CHLORIDE 0.9% FLUSH
3.0000 mL | Freq: Two times a day (BID) | INTRAVENOUS | Status: DC
  Administered 2020-10-28: 3 mL via INTRAVENOUS

## 2020-10-27 MED ORDER — OXYCODONE-ACETAMINOPHEN 10-325 MG PO TABS: 1.0000 | ORAL_TABLET | Freq: Four times a day (QID) | ORAL | 0 refills | Status: AC | PRN

## 2020-10-27 MED ORDER — FENTANYL CITRATE (PF) 250 MCG/5ML IJ SOLN
INTRAMUSCULAR | Status: DC | PRN
  Administered 2020-10-27: 100 ug via INTRAVENOUS

## 2020-10-27 MED ORDER — ACETAMINOPHEN 10 MG/ML IV SOLN
INTRAVENOUS | Status: AC
  Filled 2020-10-27: qty 100

## 2020-10-27 MED ORDER — PHENYLEPHRINE HCL-NACL 10-0.9 MG/250ML-% IV SOLN
INTRAVENOUS | Status: DC | PRN
  Administered 2020-10-27: 25 ug/min via INTRAVENOUS

## 2020-10-27 MED ORDER — PROPOFOL 1000 MG/100ML IV EMUL
INTRAVENOUS | Status: AC
  Filled 2020-10-27: qty 100

## 2020-10-27 MED ORDER — LACTATED RINGERS IV SOLN: INTRAVENOUS | Status: DC | PRN

## 2020-10-27 MED ORDER — CEFAZOLIN SODIUM-DEXTROSE 2-4 GM/100ML-% IV SOLN
2.0000 g | INTRAVENOUS | Status: DC
  Filled 2020-10-27: qty 100

## 2020-10-27 MED ORDER — LIDOCAINE 2% (20 MG/ML) 5 ML SYRINGE
INTRAMUSCULAR | Status: AC
  Filled 2020-10-27: qty 5

## 2020-10-27 MED ORDER — MAGNESIUM HYDROXIDE 400 MG/5ML PO SUSP: 30.0000 mL | Freq: Every day | ORAL | Status: DC | PRN

## 2020-10-27 MED ORDER — DEXAMETHASONE SODIUM PHOSPHATE 10 MG/ML IJ SOLN
INTRAMUSCULAR | Status: AC
  Filled 2020-10-27: qty 1

## 2020-10-27 MED ORDER — OXYCODONE HCL 5 MG/5ML PO SOLN: 5.0000 mg | Freq: Once | ORAL | Status: DC | PRN

## 2020-10-27 MED ORDER — ONDANSETRON HCL 4 MG PO TABS: 4.0000 mg | ORAL_TABLET | Freq: Four times a day (QID) | ORAL | Status: DC | PRN

## 2020-10-27 MED ORDER — ONDANSETRON HCL 4 MG/2ML IJ SOLN
INTRAMUSCULAR | Status: AC
  Filled 2020-10-27: qty 2

## 2020-10-27 MED ORDER — OXYCODONE HCL 5 MG PO TABS
10.0000 mg | ORAL_TABLET | ORAL | Status: DC | PRN
  Administered 2020-10-27 – 2020-10-28 (×3): 10 mg via ORAL
  Filled 2020-10-27 (×3): qty 2

## 2020-10-27 MED ORDER — ESMOLOL HCL 100 MG/10ML IV SOLN
INTRAVENOUS | Status: AC
  Filled 2020-10-27: qty 10

## 2020-10-27 MED ORDER — FENTANYL CITRATE (PF) 250 MCG/5ML IJ SOLN
INTRAMUSCULAR | Status: AC
  Filled 2020-10-27: qty 5

## 2020-10-27 MED ORDER — KETAMINE HCL 50 MG/5ML IJ SOSY
PREFILLED_SYRINGE | INTRAMUSCULAR | Status: AC
  Filled 2020-10-27: qty 5

## 2020-10-27 MED ORDER — MIDAZOLAM HCL 2 MG/2ML IJ SOLN
0.5000 mg | Freq: Once | INTRAMUSCULAR | Status: DC | PRN
Start: 2020-10-27 — End: 2020-10-27

## 2020-10-27 MED ORDER — PROPOFOL 10 MG/ML IV BOLUS
INTRAVENOUS | Status: AC
  Filled 2020-10-27: qty 20

## 2020-10-27 MED ORDER — THROMBIN 20000 UNITS EX SOLR
CUTANEOUS | Status: AC
  Filled 2020-10-27: qty 20000

## 2020-10-27 MED ORDER — SUGAMMADEX SODIUM 200 MG/2ML IV SOLN: INTRAVENOUS | Status: DC | PRN

## 2020-10-27 MED ORDER — NITROGLYCERIN 0.4 MG SL SUBL: 0.4000 mg | SUBLINGUAL_TABLET | SUBLINGUAL | Status: DC | PRN

## 2020-10-27 MED ORDER — HYDROMORPHONE HCL 1 MG/ML IJ SOLN
INTRAMUSCULAR | Status: AC
  Filled 2020-10-27: qty 1

## 2020-10-27 MED ORDER — METHOCARBAMOL 500 MG PO TABS
500.0000 mg | ORAL_TABLET | Freq: Four times a day (QID) | ORAL | Status: DC | PRN
  Administered 2020-10-27 – 2020-10-28 (×4): 500 mg via ORAL
  Filled 2020-10-27 (×3): qty 1

## 2020-10-27 MED ORDER — PROPOFOL 500 MG/50ML IV EMUL
INTRAVENOUS | Status: DC | PRN
  Administered 2020-10-27: 50 ug/kg/min via INTRAVENOUS
  Administered 2020-10-27: 60 ug/kg/min via INTRAVENOUS
  Administered 2020-10-27: 100 ug/kg/min via INTRAVENOUS

## 2020-10-27 MED ORDER — ACETAMINOPHEN 10 MG/ML IV SOLN
INTRAVENOUS | Status: DC | PRN
  Administered 2020-10-27: 1000 mg via INTRAVENOUS

## 2020-10-27 MED ORDER — CEFAZOLIN SODIUM-DEXTROSE 2-3 GM-%(50ML) IV SOLR
INTRAVENOUS | Status: DC | PRN
  Administered 2020-10-27: 2 g via INTRAVENOUS

## 2020-10-27 MED ORDER — PROPOFOL 10 MG/ML IV BOLUS
INTRAVENOUS | Status: DC | PRN
  Administered 2020-10-27: 150 mg via INTRAVENOUS

## 2020-10-27 MED ORDER — PHENYLEPHRINE HCL-NACL 10-0.9 MG/250ML-% IV SOLN
INTRAVENOUS | Status: AC
  Filled 2020-10-27: qty 500

## 2020-10-27 MED ORDER — ATORVASTATIN CALCIUM 80 MG PO TABS
80.0000 mg | ORAL_TABLET | Freq: Every day | ORAL | Status: DC
  Administered 2020-10-27: 22:00:00 80 mg via ORAL
  Filled 2020-10-27: qty 1

## 2020-10-27 MED ORDER — ONDANSETRON HCL 4 MG PO TABS: 4.0000 mg | ORAL_TABLET | Freq: Three times a day (TID) | ORAL | 0 refills | Status: AC | PRN

## 2020-10-27 MED ORDER — MORPHINE SULFATE (PF) 2 MG/ML IV SOLN
2.0000 mg | INTRAVENOUS | Status: DC | PRN
  Administered 2020-10-27 (×2): 2 mg via INTRAVENOUS
  Filled 2020-10-27 (×2): qty 1

## 2020-10-27 MED ORDER — ONDANSETRON HCL 4 MG/2ML IJ SOLN: 4.0000 mg | Freq: Four times a day (QID) | INTRAMUSCULAR | Status: DC | PRN

## 2020-10-27 MED ORDER — METHOCARBAMOL 1000 MG/10ML IJ SOLN
500.0000 mg | Freq: Four times a day (QID) | INTRAVENOUS | Status: DC | PRN
  Filled 2020-10-27: qty 5

## 2020-10-27 MED ORDER — HYDROMORPHONE HCL 1 MG/ML IJ SOLN
0.2500 mg | INTRAMUSCULAR | Status: DC | PRN
  Administered 2020-10-27 (×2): 0.5 mg via INTRAVENOUS

## 2020-10-27 MED ORDER — MIDAZOLAM HCL 2 MG/2ML IJ SOLN
INTRAMUSCULAR | Status: DC | PRN
  Administered 2020-10-27: 2 mg via INTRAVENOUS

## 2020-10-27 MED ORDER — HEMOSTATIC AGENTS (NO CHARGE) OPTIME
TOPICAL | Status: DC | PRN
  Administered 2020-10-27: 1 via TOPICAL

## 2020-10-27 MED ORDER — MIDAZOLAM HCL 2 MG/2ML IJ SOLN
INTRAMUSCULAR | Status: AC
  Filled 2020-10-27: qty 2

## 2020-10-27 MED ORDER — INSULIN ASPART 100 UNIT/ML ~~LOC~~ SOLN
0.0000 [IU] | Freq: Three times a day (TID) | SUBCUTANEOUS | Status: DC
  Administered 2020-10-27 – 2020-10-28 (×2): 8 [IU] via SUBCUTANEOUS

## 2020-10-27 MED ORDER — METHOCARBAMOL 500 MG PO TABS: 500.0000 mg | ORAL_TABLET | Freq: Three times a day (TID) | ORAL | 0 refills | Status: AC | PRN

## 2020-10-27 MED ORDER — PHENYLEPHRINE 40 MCG/ML (10ML) SYRINGE FOR IV PUSH (FOR BLOOD PRESSURE SUPPORT)
PREFILLED_SYRINGE | INTRAVENOUS | Status: DC | PRN
  Administered 2020-10-27: 80 ug via INTRAVENOUS
  Administered 2020-10-27: 120 ug via INTRAVENOUS

## 2020-10-27 MED ORDER — SODIUM CHLORIDE 0.9% FLUSH: 3.0000 mL | INTRAVENOUS | Status: DC | PRN

## 2020-10-27 MED ORDER — THROMBIN 20000 UNITS EX SOLR
CUTANEOUS | Status: DC | PRN
  Administered 2020-10-27: 09:00:00 20 mL via TOPICAL

## 2020-10-27 MED ORDER — ARTIFICIAL TEARS OPHTHALMIC OINT
TOPICAL_OINTMENT | OPHTHALMIC | Status: DC | PRN
  Administered 2020-10-27: 1 via OPHTHALMIC

## 2020-10-27 MED ORDER — BUPIVACAINE-EPINEPHRINE (PF) 0.25% -1:200000 IJ SOLN
INTRAMUSCULAR | Status: AC
  Filled 2020-10-27: qty 30

## 2020-10-27 MED ORDER — MEPERIDINE HCL 25 MG/ML IJ SOLN
6.2500 mg | INTRAMUSCULAR | Status: DC | PRN
Start: 2020-10-27 — End: 2020-10-27

## 2020-10-27 MED ORDER — SURGIFOAM 100 EX MISC
CUTANEOUS | Status: DC | PRN
  Administered 2020-10-27: 1 via TOPICAL

## 2020-10-27 MED ORDER — ACETAMINOPHEN 325 MG PO TABS: 650.0000 mg | ORAL_TABLET | ORAL | Status: DC | PRN

## 2020-10-27 MED ORDER — LACTATED RINGERS IV SOLN: INTRAVENOUS | Status: DC

## 2020-10-27 MED ORDER — INSULIN DEGLUDEC 100 UNIT/ML ~~LOC~~ SOPN: 30.0000 [IU] | PEN_INJECTOR | Freq: Every day | SUBCUTANEOUS | Status: DC

## 2020-10-27 MED ORDER — CHLORHEXIDINE GLUCONATE 0.12 % MT SOLN
15.0000 mL | Freq: Once | OROMUCOSAL | Status: AC
  Administered 2020-10-27: 06:00:00 15 mL via OROMUCOSAL
  Filled 2020-10-27: qty 15

## 2020-10-27 MED ORDER — MAGNESIUM CITRATE PO SOLN: 1.0000 | Freq: Once | ORAL | Status: DC | PRN

## 2020-10-27 MED ORDER — ACETAMINOPHEN 650 MG RE SUPP: 650.0000 mg | RECTAL | Status: DC | PRN

## 2020-10-27 MED ORDER — PHENOL 1.4 % MT LIQD: 1.0000 | OROMUCOSAL | Status: DC | PRN

## 2020-10-27 MED ORDER — DEXAMETHASONE SODIUM PHOSPHATE 10 MG/ML IJ SOLN
INTRAMUSCULAR | Status: DC | PRN
  Administered 2020-10-27: 10 mg via INTRAVENOUS

## 2020-10-27 MED ORDER — 0.9 % SODIUM CHLORIDE (POUR BTL) OPTIME
TOPICAL | Status: DC | PRN
  Administered 2020-10-27 (×2): 1000 mL

## 2020-10-27 MED ORDER — SUCCINYLCHOLINE CHLORIDE 200 MG/10ML IV SOSY
PREFILLED_SYRINGE | INTRAVENOUS | Status: DC | PRN
  Administered 2020-10-27: 160 mg via INTRAVENOUS

## 2020-10-27 MED ORDER — OXYCODONE HCL 5 MG PO TABS: 5.0000 mg | ORAL_TABLET | Freq: Once | ORAL | Status: DC | PRN

## 2020-10-27 MED ORDER — BUPIVACAINE-EPINEPHRINE 0.25% -1:200000 IJ SOLN
INTRAMUSCULAR | Status: DC | PRN
  Administered 2020-10-27: 10 mL

## 2020-10-27 MED ORDER — KETAMINE HCL 10 MG/ML IJ SOLN
INTRAMUSCULAR | Status: DC | PRN
  Administered 2020-10-27: 30 mg via INTRAVENOUS
  Administered 2020-10-27: 20 mg via INTRAVENOUS

## 2020-10-27 MED ORDER — SODIUM CHLORIDE 0.9 % IV SOLN: INTRAVENOUS | Status: DC

## 2020-10-27 MED ORDER — METOPROLOL TARTRATE 25 MG PO TABS
25.0000 mg | ORAL_TABLET | Freq: Two times a day (BID) | ORAL | Status: DC
  Administered 2020-10-27 – 2020-10-28 (×2): 25 mg via ORAL
  Filled 2020-10-27 (×2): qty 1

## 2020-10-27 MED ORDER — GABAPENTIN 100 MG PO CAPS
100.0000 mg | ORAL_CAPSULE | Freq: Every day | ORAL | Status: DC
  Administered 2020-10-27: 22:00:00 100 mg via ORAL
  Filled 2020-10-27: qty 1

## 2020-10-27 MED ORDER — LIDOCAINE 2% (20 MG/ML) 5 ML SYRINGE
INTRAMUSCULAR | Status: DC | PRN
  Administered 2020-10-27: 40 mg via INTRAVENOUS

## 2020-10-27 MED ORDER — CEFAZOLIN SODIUM-DEXTROSE 1-4 GM/50ML-% IV SOLN
1.0000 g | Freq: Three times a day (TID) | INTRAVENOUS | Status: AC
  Administered 2020-10-27 (×2): 1 g via INTRAVENOUS
  Filled 2020-10-27 (×2): qty 50

## 2020-10-27 MED ORDER — AMLODIPINE BESYLATE 5 MG PO TABS
5.0000 mg | ORAL_TABLET | Freq: Every day | ORAL | Status: DC
  Administered 2020-10-28: 5 mg via ORAL
  Filled 2020-10-27: qty 1

## 2020-10-27 MED ORDER — ORAL CARE MOUTH RINSE: 15.0000 mL | Freq: Once | OROMUCOSAL | Status: AC

## 2020-10-27 MED ORDER — OXYCODONE HCL 5 MG PO TABS: 5.0000 mg | ORAL_TABLET | ORAL | Status: DC | PRN

## 2020-10-27 MED ORDER — INSULIN GLARGINE 100 UNIT/ML ~~LOC~~ SOLN
30.0000 [IU] | Freq: Every day | SUBCUTANEOUS | Status: DC
  Administered 2020-10-28: 30 [IU] via SUBCUTANEOUS
  Filled 2020-10-27: qty 0.3

## 2020-10-27 MED ORDER — PROPOFOL 1000 MG/100ML IV EMUL
INTRAVENOUS | Status: AC
  Filled 2020-10-27: qty 200

## 2020-10-27 MED ORDER — INSULIN ASPART 100 UNIT/ML ~~LOC~~ SOLN
0.0000 [IU] | Freq: Every day | SUBCUTANEOUS | Status: DC
  Administered 2020-10-27: 22:00:00 3 [IU] via SUBCUTANEOUS

## 2020-10-27 MED ORDER — MENTHOL 3 MG MT LOZG: 1.0000 | LOZENGE | OROMUCOSAL | Status: DC | PRN

## 2020-10-27 MED ORDER — ACETAMINOPHEN 500 MG PO TABS
1000.0000 mg | ORAL_TABLET | Freq: Once | ORAL | Status: AC
  Administered 2020-10-27: 1000 mg via ORAL
  Filled 2020-10-27: qty 2

## 2020-10-27 MED ORDER — PROMETHAZINE HCL 25 MG/ML IJ SOLN
6.2500 mg | INTRAMUSCULAR | Status: DC | PRN
Start: 2020-10-27 — End: 2020-10-27

## 2020-10-27 MED ORDER — METHOCARBAMOL 500 MG PO TABS
ORAL_TABLET | ORAL | Status: AC
  Filled 2020-10-27: qty 1

## 2020-10-27 SURGICAL SUPPLY — 73 items
BLADE CLIPPER SURG (BLADE) IMPLANT
BONE MATRIX OSTEOCEL PRO MED (Bone Implant) ×2 IMPLANT
BUR EGG ELITE 4.0 (BURR) IMPLANT
BUR MATCHSTICK NEURO 3.0 LAGG (BURR) ×2 IMPLANT
BUR ROUND PRECISION 4.0 (BURR) ×2 IMPLANT
CABLE BIPOLOR RESECTION CORD (MISCELLANEOUS) ×2 IMPLANT
CANISTER SUCT 3000ML PPV (MISCELLANEOUS) ×2 IMPLANT
CAP RELINE MOD TULIP RMM (Cap) ×8 IMPLANT
CLIP NEUROVISION LG (CLIP) ×2 IMPLANT
CLSR STERI-STRIP ANTIMIC 1/2X4 (GAUZE/BANDAGES/DRESSINGS) ×2 IMPLANT
COVER SURGICAL LIGHT HANDLE (MISCELLANEOUS) ×2 IMPLANT
COVER WAND RF STERILE (DRAPES) IMPLANT
DRAIN CHANNEL 15F RND FF W/TCR (WOUND CARE) ×2 IMPLANT
DRAPE C-ARM 42X72 X-RAY (DRAPES) ×2 IMPLANT
DRAPE C-ARMOR (DRAPES) ×2 IMPLANT
DRAPE INCISE IOBAN 66X45 STRL (DRAPES) ×2 IMPLANT
DRAPE POUCH INSTRU U-SHP 10X18 (DRAPES) ×2 IMPLANT
DRAPE SURG 17X23 STRL (DRAPES) ×2 IMPLANT
DRAPE U-SHAPE 47X51 STRL (DRAPES) ×2 IMPLANT
DRSG OPSITE POSTOP 4X6 (GAUZE/BANDAGES/DRESSINGS) ×2 IMPLANT
DRSG OPSITE POSTOP 4X8 (GAUZE/BANDAGES/DRESSINGS) ×2 IMPLANT
DURAPREP 26ML APPLICATOR (WOUND CARE) ×2 IMPLANT
ELECT BLADE 4.0 EZ CLEAN MEGAD (MISCELLANEOUS) ×2
ELECT BLADE 6.5 EXT (BLADE) ×2 IMPLANT
ELECT PENCIL ROCKER SW 15FT (MISCELLANEOUS) ×2 IMPLANT
ELECT REM PT RETURN 9FT ADLT (ELECTROSURGICAL) ×2
ELECTRODE BLDE 4.0 EZ CLN MEGD (MISCELLANEOUS) ×1 IMPLANT
ELECTRODE REM PT RTRN 9FT ADLT (ELECTROSURGICAL) ×1 IMPLANT
EVACUATOR SILICONE 100CC (DRAIN) ×2 IMPLANT
GLOVE BIOGEL PI IND STRL 8.5 (GLOVE) ×1 IMPLANT
GLOVE BIOGEL PI INDICATOR 8.5 (GLOVE) ×1
GLOVE SS BIOGEL STRL SZ 8.5 (GLOVE) ×1 IMPLANT
GLOVE SUPERSENSE BIOGEL SZ 8.5 (GLOVE) ×1
GOWN STRL REUS W/ TWL LRG LVL3 (GOWN DISPOSABLE) ×1 IMPLANT
GOWN STRL REUS W/TWL 2XL LVL3 (GOWN DISPOSABLE) ×4 IMPLANT
GOWN STRL REUS W/TWL LRG LVL3 (GOWN DISPOSABLE) ×2
IMPL TLX20 10X11X31 20D (Cage) ×1 IMPLANT
KIT BASIN OR (CUSTOM PROCEDURE TRAY) ×2 IMPLANT
KIT POSITION SURG JACKSON T1 (MISCELLANEOUS) ×2 IMPLANT
KIT TURNOVER KIT B (KITS) ×2 IMPLANT
MILL MEDIUM DISP (BLADE) ×2 IMPLANT
MODULE EMG NEEDLE SSEP NVM5 (NEEDLE) ×2 IMPLANT
MODULE NVM5 NEXT GEN EMG (NEEDLE) ×2 IMPLANT
NEEDLE 22X1 1/2 (OR ONLY) (NEEDLE) ×2 IMPLANT
NEEDLE SPNL 18GX3.5 QUINCKE PK (NEEDLE) ×4 IMPLANT
NS IRRIG 1000ML POUR BTL (IV SOLUTION) ×2 IMPLANT
PACK LAMINECTOMY ORTHO (CUSTOM PROCEDURE TRAY) ×2 IMPLANT
PACK UNIVERSAL I (CUSTOM PROCEDURE TRAY) ×2 IMPLANT
PAD ARMBOARD 7.5X6 YLW CONV (MISCELLANEOUS) ×4 IMPLANT
PATTIES SURGICAL .5 X.5 (GAUZE/BANDAGES/DRESSINGS) ×2 IMPLANT
PATTIES SURGICAL .5 X1 (DISPOSABLE) ×2 IMPLANT
POSITIONER HEAD PRONE TRACH (MISCELLANEOUS) ×2 IMPLANT
PROBE BALL TIP NVM5 SNG USE (BALLOONS) ×2 IMPLANT
ROD RELINE O COCR 5.0X50MM (Rod) ×4 IMPLANT
SCREW LOCK RSS 4.5/5.0MM (Screw) ×8 IMPLANT
SCREW SHANK RELINE MOD 5.5X35 (Screw) ×8 IMPLANT
SPONGE LAP 4X18 RFD (DISPOSABLE) ×6 IMPLANT
SPONGE SURGIFOAM ABS GEL 100 (HEMOSTASIS) ×2 IMPLANT
SURGIFLO W/THROMBIN 8M KIT (HEMOSTASIS) ×2 IMPLANT
SUT BONE WAX W31G (SUTURE) ×2 IMPLANT
SUT ETHILON 3 0 PS 1 (SUTURE) ×2 IMPLANT
SUT MNCRL AB 3-0 PS2 27 (SUTURE) ×2 IMPLANT
SUT VIC AB 1 CT1 18XCR BRD 8 (SUTURE) ×1 IMPLANT
SUT VIC AB 1 CT1 8-18 (SUTURE) ×2
SUT VIC AB 2-0 CT1 18 (SUTURE) ×4 IMPLANT
SYR BULB IRRIG 60ML STRL (SYRINGE) ×2 IMPLANT
SYR CONTROL 10ML LL (SYRINGE) ×2 IMPLANT
TLX20 IMPLANT 10X11X31 20D (Cage) ×2 IMPLANT
TOWEL GREEN STERILE (TOWEL DISPOSABLE) ×2 IMPLANT
TOWEL GREEN STERILE FF (TOWEL DISPOSABLE) ×2 IMPLANT
TRAY FOLEY MTR SLVR 16FR STAT (SET/KITS/TRAYS/PACK) ×2 IMPLANT
WATER STERILE IRR 1000ML POUR (IV SOLUTION) ×2 IMPLANT
YANKAUER SUCT BULB TIP NO VENT (SUCTIONS) ×2 IMPLANT

## 2020-10-27 NOTE — Brief Op Note (Signed)
10/27/2020  12:01 PM  PATIENT:  Derrick Mosley  44 y.o. 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

## 2020-10-27 NOTE — Transfer of Care (Signed)
Immediate Anesthesia Transfer of Care Note  Patient: Derrick Mosley  Procedure(s) Performed: TRANSFORAMINAL LUMBAR INTERBODY FUSION (TLIF) LUMBAR FOUR-FIVE (N/A Spine Lumbar)  Patient Location: PACU  Anesthesia Type:General  Level of Consciousness: drowsy  Airway & Oxygen Therapy: Patient Spontanous Breathing and Patient connected to face mask oxygen  Post-op Assessment: Report given to RN and Post -op Vital signs reviewed and stable  Post vital signs: Reviewed and stable  Last Vitals:  Vitals Value Taken Time  BP 121/87 10/27/20 1237  Temp    Pulse 88 10/27/20 1238  Resp 26 10/27/20 1238  SpO2 100 % 10/27/20 1238  Vitals shown include unvalidated device data.  Last Pain:  Vitals:   10/27/20 0622  TempSrc:   PainSc: 0-No pain      Patients Stated Pain Goal: 3 (47/84/12 8208)  Complications: No complications documented.

## 2020-10-27 NOTE — Discharge Instructions (Signed)
  Spinal Fusion, Adult, Care After This sheet gives you information about how to care for yourself after your procedure. Your doctor may also give you more specific instructions. If you have problems or questions, contact your doctor. Follow these instructions at home: Medicines Take over-the-counter and prescription medicines only as told by your doctor. These include any medicines for pain or blood-thinning medicines (anticoagulants). If you were prescribed an antibiotic medicine, take it as told by your doctor. Do not stop taking the antibiotic even if you start to feel better. Do not drive for 24 hours if you were given a medicine to help you relax (sedative) during your procedure. Do not drive or use heavy machinery while taking prescription pain medicine. If you have a brace: Wear the brace as told by your doctor. Take it off only as told by your doctor. Keep the brace clean. Managing pain, stiffness, and swelling If directed, put ice on the surgery area: If you have a removable brace, take it off as told by your doctor. Put ice in a plastic bag. Place a towel between your skin and the bag. Leave the ice on for 20 minutes, 2-3 times a day. Surgery cut care    Follow instructions from your doctor about how to take care of your cut from surgery (incision). Make sure you: Wash your hands with soap and water before you change your bandage (dressing). If you cannot use soap and water, use hand sanitizer. Change your bandage as told by your doctor. Leave stitches (sutures), skin glue, or skin tape (adhesive) strips in place. They may need to stay in place for 2 weeks or longer. If tape strips get loose and curl up, you may trim the loose edges. Do not remove tape strips completely unless your doctor says it is okay. Keep your cut from surgery clean and dry. Do not take baths, swim, or use a hot tub until your doctor says it is okay. Ask your doctor if you can take showers. You may only  be allowed to take sponge baths. Every day, check your cut from surgery and the area around it for: More redness, swelling, or pain. Fluid or blood. Warmth. Pus or a bad smell. If you have a drain tube, follow instructions from your doctor about caring for it. Do not take out the drain tube or any bandages unless your doctor says it is okay. Physical activity Rest and protect your back as much as possible. Follow instructions from your doctor about how to move. Use good posture to help your spine heal. Do not lift anything that is heavier than 8 lb (3.6 kg), or the limit that you are told, until your doctor says that it is safe. Do not twist or bend at the waist until your doctor says it is okay. It is best if you: Do not make pushing and pulling motions. Do not sit or lie down in the same position for a long time. Do not raise your hands or arms above your head. Return to your normal activities as told by your doctor. Ask your doctor what activities are safe for you. Rest and protect your back as much as you can. Do not start to exercise until your doctor says it is okay. Ask your doctor what kinds of exercise you can do to make your back stronger. Ok to shower in 5 days.  Do not take a bath or submerge the wound General instructions To prevent blood clots and lessen swelling   kinds of exercise you can do to make your back stronger.  Ok to shower in 5 days.  Do not take a bath or submerge the wound General instructions  To prevent blood clots and lessen swelling in your legs: ? Wear compression stockings as told. ? Walk one or more times every few hours as told by your doctor.  Do not use any products that contain nicotine or tobacco, such as cigarettes and e-cigarettes. These can delay bone healing. If you need help quitting, ask your doctor.  To prevent or treat constipation while you are taking prescription pain medicine, your doctor may suggest that you: ? Drink enough fluid to keep your pee (urine) pale yellow. ? Take over-the-counter or prescription medicines. ? Eat foods that are high  in fiber. These include fresh fruits and vegetables, whole grains, and beans. ? Limit foods that are high in fat and processed sugars, such as fried and sweet foods.  Keep all follow-up visits as told by your doctor. This is important. Contact a doctor if:  Your pain gets worse.  Your medicine does not help your pain.  Your legs or feet get painful or swollen.  Your cut from surgery is more red, swollen, or painful.  Your cut from surgery feels warm to the touch.  You have: ? Fluid or blood coming from your cut from surgery. ? Pus or a bad smell coming from your cut from surgery. ? A fever. ? Weakness or loss of feeling (numbness) in your legs that is new or getting worse. ? Trouble controlling when you pee (urinate) or poop (have a bowel movement).  You feel sick to your stomach (nauseous).  You throw up (vomit). Get help right away if:  Your pain is very bad.  You have chest pain.  You have trouble breathing.  You start to have a cough. These symptoms may be an emergency. Do not wait to see if the symptoms will go away. Get medical help right away. Call your local emergency services (911 in the [place] Do not drive yourself to the hospital. Summary  After the procedure, it is common to have pain in your back and pain by your surgery cut(s).  Icing and pain medicines may help to control the pain. Follow directions from your doctor.  Rest and protect your back as much as possible. Do not twist or bend at the waist.  Get up and walk one or more times every few hours as told by your doctor. This information is not intended to replace advice given to you by your health care provider. Make sure you discuss any questions you have with your health care provider.   Restart [facility] on Saturday.  Contact MD if you experience bleeding from the wound, weakness, or loss of control of bowel or bladder, or inability to urinate.

## 2020-10-27 NOTE — Anesthesia Postprocedure Evaluation (Signed)
Anesthesia Post Note  Patient: Derrick Mosley  Procedure(s) Performed: TRANSFORAMINAL LUMBAR INTERBODY FUSION (TLIF) LUMBAR FOUR-FIVE (N/A Spine Lumbar)     Patient location during evaluation: PACU Anesthesia Type: General Level of consciousness: awake and alert Pain management: pain level controlled (pt states "I feel good") Vital Signs Assessment: post-procedure vital signs reviewed and stable Respiratory status: spontaneous breathing, nonlabored ventilation and respiratory function stable Cardiovascular status: blood pressure returned to baseline and stable Postop Assessment: no apparent nausea or vomiting Anesthetic complications: no   No complications documented.  Last Vitals:  Vitals:   10/27/20 1325 10/27/20 1340  BP: (!) 148/90 (!) 143/98  Pulse: 87 87  Resp: 16 16  Temp:    SpO2: 100% 100%    Last Pain:  Vitals:   10/27/20 1344  TempSrc:   PainSc: 10-Worst pain ever                 Corlette Ciano,E. Shanaiya Bene

## 2020-10-27 NOTE — Op Note (Signed)
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  Postoperative diagnosis: Same  Operative procedure: Transforaminal lumbar interbody fusion L 4-5  Complications: None  EBL: 100 cc  Implants: NuVasive expandable TLIF cage.  10 x 11 x 31.  NuVasive cortical pedicle screw fixation.  5.5 x 35 mm x 4.  Graft: Autograft from decompression combined with osteocell allograft  Neuro monitoring: No adverse activity throughout the course of the case.  Monitored EMG and SSEP.  Pedicle screws were directly stimulated: Right side: L5 25 mA, L4: 26 mA.  Left side: L5: 22 mA, L4: 35 mA  First Assistant: Cleta Alberts, PA  Indications: Mr. Schertzer is a very pleasant 44 year old gentleman with progressive debilitating back buttock and neuropathic leg pain.  Despite appropriate conservative management his overall quality of life is continued to deteriorate.  As result we elected to move forward with surgery.  In order to address the severe lateral recess and foraminal stenosis as well as degenerative disc disease I elected to move forward with the transforaminal lumbar interbody fusion L4-5.  Operative report  Patient is brought the operating room placed upon the operating room table.  After successful induction of general anesthesia and endotracheal intubation, teds, SCDs, and Foley were inserted.  The neuro monitoring representative then applied all appropriate pads and needles for intraoperative SSEP and EMG monitoring.  Patient was then turned prone onto the spine frame and all bony prominences well-padded.  The back was then prepped and draped in a standard fashion.  A standard timeout was taken to confirm patient procedure and all other important data.  Fluoroscopy was used to identify the superior aspect of the L4 vertebral body and the inferior aspect of the L5 vertebral body I then marked out the midline  and infiltrated this with quarter percent Marcaine with epinephrine.  Midline incision was made sharp dissection was carried out down to the deep fascia I incised the deep fascia and then exposed the lamina of L4 and L5.  I then exposed the L3-4 and L4-5 facet complex bilaterally.  At this point with the posterior bony anatomy exposed I then proceeded with placing my cortical screws.  On the right side I placed my high-speed bur at the 7 o'clock position on the pedicle and then broach the cortex.  I then advanced the pedicle probe using the AP view to guide my trajectory.  I aimed towards the 1 o'clock position on the pedicle.  As I passed the midline portion of the pedicle I switched to the lateral view to ensure that I was proceeding appropriately.  Once I had the final appropriate depth I confirmed in both planes satisfactory position.  I then remove the pedicle probe and palpated the hole with a ball-tipped feeler.  Once I confirmed a solid bony canal I then tapped and then reconfirmed a solid bony canal with ball-tipped feeler.  I then obtained the 35 mm screw and placed it.  I repeated this exact same procedure at L5 on the right side and both L4 and L5 on the contralateral side.  Once all 4 pedicle screws were properly positioned I then directly stimulated them and confirmed that there was no adverse free running EMG activity at 22 mA.  With the pedicle screw fixation complete I then turned my attention to the decompression.  Using an osteotome I remove the entire inferior left L4 facet.  I then used a 3  mm Kerrison rongeur to remove the bulk of the pars.  I also took down the majority of the L4 left lamina.  At this point I had a complete Gill decompression on the left side.  I then gently dissected through the ligamentum flavum and develop a plane between the ligamentum flavum and the sacral thecal sac.  I then resected this to adequately decompress the thecal sac.  At this point I could now visualize  the exiting L4 nerve root and the traversing L5 nerve root.  I decompressed from the L4 pedicle down to the L5 pedicle and I could palpate the medial and inferior aspect of the L4 pedicle as well as the L5 pedicle I confirmed that there was no breach.  At this point I gently swept the thecal sac medially and identified the large epidural veins which were all coagulated with bipolar electrocautery.  Annulotomy was performed with a 15 blade scalpel and I used a combination of pituitary rongeurs curettes and Kerrison rongeurs to remove the bulk of all the disc material.  Make sure I bleeding subchondral bone.  I then used the trial device to ensure that the implant was properly seated.  Once I confirm this I obtained the expandable NuVasive TLIF cage and malleted into position.  I confirmed that it was properly positioned in both the AP and lateral planes.  I was beyond the midline as based on the spinous process and I was countersunk below the posterior aspect of the vertebral body.  I then expanded it approximately 75% to provide adequate overall lordosis.  With excellent contact with the endplates I then backfilled the cage with the autograft bone that harvested from the decompression.  I placed additional bone graft down the lateral aspect of the disc space.  At this point with the decompression and discectomy complete on the left side I then gently began dissecting over into the right side.  I took down the ligamentum flavum and I was able to undercut the medial aspect of the right L5 facet to adequately decompress this area.  I had satisfactory overall bilateral decompression of the lateral recess and restoration of disc height.  After final irrigation I used my Woodson elevator to confirm satisfactory decompression by passing into the L5 foramen and along the L4-5 lateral recess and on the left side.  I then placed the polyaxial heads and secured the rod to the pedicle screw construct.  All 4 locking caps were  torqued off according manufacture standards.  A drain was placed and brought out through a separate stab and physician.  I then used a high-speed bur to decorticate the right posterior lateral gutter and I placed the remainder of the autograft bone plus osteocell in the posterior lateral gutter to aid in the overall fusion.  I made sure that no bone debris was laying on the thecal sac.  After final irrigation and confirmed hemostasis I then remove the retractors and closed the deep fascia with interrupted #1 Vicryl sutures.  Superficial with 2-0 Vicryl suture, and 3-0 Monocryl for the skin.  Steri-Strips and a dry dressing were also applied.  The drain was sutured in with a 3-0 nylon to prevent inadvertent removal.  Steri-Strips dry dressings were applied and the patient was ultimately extubated transfer the PACU without incident.  The end of the case all needle and sponge counts were correct.  There were no adverse intraoperative events.

## 2020-10-27 NOTE — H&P (Signed)
Addendum H&P  Patient continues to have significant back buttock and neuropathic left leg pain.  Despite appropriate conservative management he continues to have significant loss in quality of life.  There has been no change in his clinical exam since his last office visit of 10/18/2020.  Plan on moving forward with a L4-5 Gill decompression with fusion (TLIF).  I have reviewed the surgical procedure with the patient in great detail as well as the risks and benefits.  All of his questions were, and he has expressed a willingness to move forward with surgery.

## 2020-10-27 NOTE — Anesthesia Procedure Notes (Signed)
Procedure Name: Intubation Date/Time: 10/27/2020 7:43 AM Performed by: Bryson Corona, CRNA Pre-anesthesia Checklist: Patient identified, Emergency Drugs available, Suction available and Patient being monitored Patient Re-evaluated:Patient Re-evaluated prior to induction Oxygen Delivery Method: Circle System Utilized Preoxygenation: Pre-oxygenation with 100% oxygen Induction Type: IV induction Ventilation: Mask ventilation without difficulty Laryngoscope Size: Mac and 4 Grade View: Grade I Tube type: Oral Tube size: 7.0 mm Number of attempts: 1 Airway Equipment and Method: Stylet Placement Confirmation: ETT inserted through vocal cords under direct vision,  positive ETCO2 and breath sounds checked- equal and bilateral Secured at: 23 cm Tube secured with: Tape Dental Injury: Teeth and Oropharynx as per pre-operative assessment  Comments: Large floppy epiglottis

## 2020-10-28 ENCOUNTER — Encounter (HOSPITAL_COMMUNITY): Payer: Self-pay | Admitting: Orthopedic Surgery

## 2020-10-28 DIAGNOSIS — M48061 Spinal stenosis, lumbar region without neurogenic claudication: Secondary | ICD-10-CM | POA: Diagnosis not present

## 2020-10-28 DIAGNOSIS — N183 Chronic kidney disease, stage 3 unspecified: Secondary | ICD-10-CM | POA: Diagnosis not present

## 2020-10-28 DIAGNOSIS — E119 Type 2 diabetes mellitus without complications: Secondary | ICD-10-CM | POA: Diagnosis not present

## 2020-10-28 DIAGNOSIS — M5116 Intervertebral disc disorders with radiculopathy, lumbar region: Secondary | ICD-10-CM | POA: Diagnosis not present

## 2020-10-28 DIAGNOSIS — M4326 Fusion of spine, lumbar region: Secondary | ICD-10-CM | POA: Diagnosis not present

## 2020-10-28 DIAGNOSIS — I129 Hypertensive chronic kidney disease with stage 1 through stage 4 chronic kidney disease, or unspecified chronic kidney disease: Secondary | ICD-10-CM | POA: Diagnosis not present

## 2020-10-28 LAB — GLUCOSE, CAPILLARY: Glucose-Capillary: 277 mg/dL — ABNORMAL HIGH (ref 70–99)

## 2020-10-28 NOTE — Care Management CC44 (Signed)
Condition Code 44 Documentation Completed  Patient Details  Name: Derrick Mosley MRN: [id number] Date of Birth: 12/14/1975   Condition Code 44 given:  Yes Patient signature on Condition Code 44 notice:  Yes Documentation of 2 MD's agreement:  Yes Code 44 added to claim:  Yes    Angelita Ingles, RN 10/28/2020, 9:50 AM

## 2020-10-28 NOTE — Progress Notes (Signed)
Patient is discharged from room 3C08 at this time. Alert and in stable condition. IV site d/c'd as well as JP drain. Instructions read to patient with understanding verbalized and all questions answered. Left unit via wheelchair with all belongings at side.

## 2020-10-28 NOTE — Care Management Obs Status (Signed)
[place] NOTIFICATION   Patient Details  Name: Derrick Mosley MRN: [id number] Date of Birth: 1976-03-10   Medicare Observation Status Notification Given:  Yes    Angelita Ingles, RN 10/28/2020, 9:50 AM

## 2020-10-28 NOTE — Evaluation (Signed)
Physical Therapy Evaluation Patient Details Name: CALBERT HULSEBUS MRN: [id number] DOB: September 10, 1976 Today's Date: 10/28/2020   History of Present Illness  44 year old male with low back pain and radicular left leg pain.  TRANSFORAMINAL LUMBAR INTERBODY FUSION (TLIF) LUMBAR FOUR-FIVE.  Clinical Impression  Patient received standing in room with OT present finishing session. Patient agreeable to PT assessment. He reports mild pain at this time. Improvement in LE pain. He is independent with donning back brace. Reviewed back precautions and patient verbalized understanding. He ambulated 250 feet with SPC  and supervision. Patient will continue to benefit from skilled PT while here to improve functional mobility and independence.      Follow Up Recommendations Follow surgeon's recommendation for DC plan and follow-up therapies    Equipment Recommendations  None recommended by PT    Recommendations for Other Services       Precautions / Restrictions Precautions Precautions: Back Precaution Booklet Issued: Yes (comment) Required Braces or Orthoses: Spinal Brace Spinal Brace: Lumbar corset Restrictions Weight Bearing Restrictions: No      Mobility  Bed Mobility Overal bed mobility: Modified Independent             General bed mobility comments: patient standing at bedside with OT present on arrival. Reviewed log rolling with patient    Transfers Overall transfer level: Modified independent Equipment used: None             General transfer comment: reaching for objects in his environment.  Ambulation/Gait Ambulation/Gait assistance: Supervision Gait Distance (Feet): 250 Feet Assistive device: None;Straight cane Gait Pattern/deviations: Step-through pattern;Decreased dorsiflexion - left Gait velocity: decr   General Gait Details: began ambulating with no AD, Patient limping due to post op shoe and wound on left foot. Patient ambulated with [facility] after that  Demonstrating improved                    quality of gait.  Stairs            Wheelchair Mobility    Modified Rankin (Stroke Patients Only)       Balance Overall balance assessment: Modified Independent                                           Pertinent Vitals/Pain Pain Assessment: Faces Faces Pain Scale: Hurts a little bit Pain Location: Back Pain Descriptors / Indicators: Guarding;Discomfort;Sore Pain Intervention(s): Limited activity within patient's tolerance;Monitored during session;Repositioned    Home Living Family/patient expects to be discharged to:: Private residence Living Arrangements: Children Available Help at Discharge: Family;Available 24 hours/day Type of Home: House Home Access: Stairs to enter Entrance Stairs-Rails: Right;Left;Can reach both Entrance Stairs-Number of Steps: 3 Home Layout: One level Home Equipment: Cane - single point      Prior Function Level of Independence: Independent with assistive device(s)         Comments: Occasional use of SPC.  Able to drive, works, no assist with ADL, IADL.     Hand Dominance   Dominant Hand: Right    Extremity/Trunk Assessment   Upper Extremity Assessment Upper Extremity Assessment: Overall WFL for tasks assessed    Lower Extremity Assessment Lower Extremity Assessment: Generalized weakness    Cervical / Trunk Assessment Cervical / Trunk Assessment: Normal  Communication   Communication: No difficulties  Cognition Arousal/Alertness: Awake/alert Behavior During Therapy: WFL for tasks assessed/performed Overall Cognitive Status: Within  Functional Limits for tasks assessed                                        General Comments      Exercises Other Exercises Other Exercises: Rviewed some basic standing LE exercises patient can perform at home to include: heel raises, marching, hip abd within tolerance.   Assessment/Plan    PT Assessment  Patient needs continued PT services  PT Problem List Decreased strength;Decreased mobility;Decreased activity tolerance;Decreased balance;Pain       PT Treatment Interventions DME instruction;Therapeutic activities;Gait training;Therapeutic exercise;Stair training;Functional mobility training;Patient/family education;Balance training    PT Goals (Current goals can be found in the Care Plan section)  Acute Rehab PT Goals Patient Stated Goal: To get back to walking without pain PT Goal Formulation: With patient Time For Goal Achievement: 11/04/20 Potential to Achieve Goals: Good    Frequency Min 5X/week   Barriers to discharge        Co-evaluation               AM-PAC PT "6 Clicks" Mobility  Outcome Measure Help needed turning from your back to your side while in a flat bed without using bedrails?: A Little Help needed moving from lying on your back to sitting on the side of a flat bed without using bedrails?: A Little Help needed moving to and from a bed to a chair (including a wheelchair)?: None Help needed standing up from a chair using your arms (e.g., wheelchair or bedside chair)?: None Help needed to walk in hospital room?: None Help needed climbing 3-5 steps with a railing? : None 6 Click Score: 22    End of Session Equipment Utilized During Treatment: Gait belt;Back brace Activity Tolerance: Patient tolerated treatment well Patient left: in bed;with call bell/phone within reach Nurse Communication: Mobility status PT Visit Diagnosis: Muscle weakness (generalized) (M62.81);Other abnormalities of gait and mobility (R26.89);Pain Pain - part of body:  (back)    Time: 5409-8119 PT Time Calculation (min) (ACUTE ONLY): 18 min   Charges:   PT Evaluation $PT Eval Moderate Complexity: 1 Mod PT Treatments $Gait Training: 8-22 mins        Janna Oak, PT, GCS 10/28/20,10:17 AM

## 2020-10-28 NOTE — Evaluation (Signed)
Occupational Therapy Evaluation Patient Details Name: Derrick Mosley MRN: [id number] DOB: 06-23-1976 Today's Date: 10/28/2020    History of Present Illness 44 year old male with low back pain and radicular left leg pain.  TRANSFORAMINAL LUMBAR INTERBODY FUSION (TLIF) LUMBAR FOUR-FIVE.   Clinical Impression   Patient admitted for above diagnosis and procedure.  Primary complaint of low back discomfort, but he is essentially close to his baseline for in room mobility and ADL performance.  Patient demonstrated good follow through with all education.  All questions answered and no further OT needs in the acute setting.      Follow Up Recommendations  No OT follow up    Equipment Recommendations  None recommended by OT    Recommendations for Other Services       Precautions / Restrictions Precautions Precautions: Back Precaution Booklet Issued: Yes (comment) Required Braces or Orthoses: Spinal Brace Spinal Brace: Lumbar corset Restrictions Weight Bearing Restrictions: No      Mobility Bed Mobility Overal bed mobility: Modified Independent             General bed mobility comments: patient standing at bedside with OT present on arrival. Reviewed log rolling with patient    Transfers Overall transfer level: Modified independent Equipment used: None             General transfer comment: reaching for objects in his environment.    Balance Overall balance assessment: Modified Independent                                         ADL either performed or assessed with clinical judgement   ADL Overall ADL's : Modified independent                                       General ADL Comments: Patient is close to, if not at his basline for in room mobility and ADLs.  Good adherence to back precautions.     Vision Baseline Vision/History: Wears glasses Patient Visual Report: No change from baseline       Perception     Praxis       Pertinent Vitals/Pain Pain Assessment: Faces Faces Pain Scale: Hurts a little bit Pain Location: Back Pain Descriptors / Indicators: Guarding;Discomfort;Sore Pain Intervention(s): Limited activity within patient's tolerance;Monitored during session;Repositioned     Hand Dominance Right   Extremity/Trunk Assessment Upper Extremity Assessment Upper Extremity Assessment: Overall WFL for tasks assessed   Lower Extremity Assessment Lower Extremity Assessment: Generalized weakness   Cervical / Trunk Assessment Cervical / Trunk Assessment: Normal   Communication Communication Communication: No difficulties   Cognition Arousal/Alertness: Awake/alert Behavior During Therapy: WFL for tasks assessed/performed Overall Cognitive Status: Within Functional Limits for tasks assessed                                     General Comments       Exercises     Shoulder Instructions      Home Living Family/patient expects to be discharged to:: Private residence Living Arrangements: Children Available Help at Discharge: Family;Available 24 hours/day Type of Home: House Home Access: Stairs to enter [employer] of Steps: 3 Entrance Stairs-Rails: Right;Left;Can reach both Home Layout: One level  Bathroom Shower/Tub: Advertising copywriter: Yes   Home Equipment: Cane - single point          Prior Functioning/Environment Level of Independence: Independent with assistive device(s)        Comments: Occasional use of SPC.  Able to drive, works, no assist with ADL, IADL.        OT Problem List: Pain      OT Treatment/Interventions:      OT Goals(Current goals can be found in the care plan section) Acute Rehab OT Goals Patient Stated Goal: Be able to walk my dogs again OT Goal Formulation: With patient Time For Goal Achievement: 10/28/20 Potential to Achieve Goals: Good  OT Frequency:      Barriers to D/C:  none noted          Co-evaluation              AM-PAC OT "6 Clicks" Daily Activity     Outcome Measure Help from another person eating meals?: None Help from another person taking care of personal grooming?: None Help from another person toileting, which includes using toliet, bedpan, or urinal?: None Help from another person bathing (including washing, rinsing, drying)?: None Help from another person to put on and taking off regular upper body clothing?: None Help from another person to put on and taking off regular lower body clothing?: None 6 Click Score: 24   End of Session Equipment Utilized During Treatment: Back brace;Other (comment) Nurse Communication: Mobility status  Activity Tolerance: Patient tolerated treatment well Patient left: in bed  OT Visit Diagnosis: Pain Pain - part of body:  (Back)                Time: 1638-4665 OT Time Calculation (min): 48 min Charges:  OT General Charges $OT Visit: 1 Visit OT Evaluation $OT Eval Moderate Complexity: 1 Mod OT Treatments $Self Care/Home Management : 23-37 mins  10/28/2020  Rich, OTR/L  Acute Rehabilitation Services  Office:  [phone]   [id number] Q [id number] 10/28/2020, 10:12 AM

## 2020-10-28 NOTE — Progress Notes (Signed)
    Subjective: Procedure(s) (LRB): TRANSFORAMINAL LUMBAR INTERBODY FUSION (TLIF) LUMBAR FOUR-FIVE (N/A) 1 Day Post-Op  Patient reports pain as 1 on 0-10 scale.  Reports decreased leg pain reports incisional back pain   Positive void Negative bowel movement Positive flatus Negative chest pain or shortness of breath  Objective: Vital signs in last 24 hours: Temp:  [97.7 F (36.5 C)-99 F (37.2 C)] 98.2 F (36.8 C) (12/31 0806) Pulse Rate:  [84-105] 93 (12/31 0806) Resp:  [13-23] 17 (12/31 0806) BP: (105-158)/(61-101) 155/95 (12/31 0806) SpO2:  [96 %-100 %] 100 % (12/31 0806) Weight:  [96.8 kg] 96.8 kg (12/30 1440)  Intake/Output from previous day: 12/30 0701 - 12/31 0700 In: 2050 [I.V.:2050] Out: 855 [Urine:575; Drains:180; Blood:100]  Labs: Recent Labs    10/25/20 1108  WBC 9.4  RBC 3.61*  HCT 33.5*  PLT 278   Recent Labs    10/25/20 1108  NA 137  K 5.0  CL 108  CO2 22  BUN 36*  CREATININE 2.60*  GLUCOSE 126*  CALCIUM 8.3*   Recent Labs    10/25/20 1108  INR 1.2    Physical Exam: Neurologically intact ABD soft Intact pulses distally Incision: dressing C/D/I Compartment soft Body mass index is 30.63 kg/m.   Assessment/Plan: Patient stable  xrays n/a Continue mobilization with physical therapy Continue care  Advance diet Up with therapy  Patient doing well - less radicular pain and ambulating without significant pain Plan on d/c this afternoon.  Discussed restarting anticoagulants tomorrow.  Patient aware of signs/ssymptoms of epidural hematoma - will call or return to ER if he has issues.  Drain only 100 cc since surgery.   Will remove this AM and if he is doing well plan on d/c arounf noon.    Melina Schools, MD [facility] [phone]

## 2020-10-31 NOTE — Progress Notes (Signed)
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 Rarely - diet soda, tea; Financial Concerns: No; Food, Clothing or Shelter Needs: No; Support System Lacking: No; Transportation Concerns: No Electronic Signature(s) Signed: 10/25/2020 6:26:47 PM By: [name] Signed: 10/26/2020 12:07:59 PM By: Linton Ham MD Entered By: [name] on 10/25/2020 13:26:49 -------------------------------------------------------------------------------- SuperBill Details Patient Name: Date of Service: [name], [name] 10/25/2020 Medical Record Number: [id number] Patient Account Number: [id number] Date of Birth/Sex: Treating RN: [birth year] (45 y.o. Derrick Mosley Primary Care Provider: PRA CTICE, DA [place] Crane Other Clinician: Referring Provider: Treating Provider/Extender: Linton Ham PRA CTICE, DA [place] Crane Weeks in Treatment: 0 Diagnosis Coding ICD-10 Codes Code Description E11.621 Type 2 diabetes mellitus with foot ulcer L97.522 Non-pressure chronic ulcer of other part  of left foot with fat layer exposed E11.42 Type 2 diabetes mellitus with diabetic polyneuropathy Facility Procedures CPT4 Code: 17494496 Description: 99213 - WOUND CARE VISIT-LEV 3 EST PT Modifier: 25 Quantity: 1 CPT4 Code: 75916384 Description: 11042 - DEB SUBQ TISSUE 20 SQ CM/< ICD-10  Diagnosis Description E11.621 Type 2 diabetes mellitus with foot ulcer L97.522 Non-pressure chronic ulcer of other part of left foot with fat layer exposed E11.42 Type 2 diabetes mellitus with  diabetic polyneuropathy Modifier: Quantity: 1 Physician Procedures Electronic Signature(s) Signed: 10/26/2020 12:07:59 PM By: Linton Ham MD Signed: 10/31/2020 2:31:34 PM By: Derrick Gouty RN, BSN Entered By: Derrick Mosley on 10/26/2020 09:01:56
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
## 2020-11-04 ENCOUNTER — Ambulatory Visit: Payer: Medicare Other | Admitting: Podiatry

## 2020-11-09 ENCOUNTER — Ambulatory Visit: Payer: Medicare Other | Admitting: Podiatry

## 2020-11-11 ENCOUNTER — Encounter (HOSPITAL_BASED_OUTPATIENT_CLINIC_OR_DEPARTMENT_OTHER): Payer: Medicare Other | Admitting: Internal Medicine

## 2020-11-11 ENCOUNTER — Encounter (HOSPITAL_BASED_OUTPATIENT_CLINIC_OR_DEPARTMENT_OTHER): Payer: Medicare Other | Attending: Internal Medicine | Admitting: Internal Medicine

## 2020-11-11 ENCOUNTER — Other Ambulatory Visit: Payer: Self-pay

## 2020-11-11 DIAGNOSIS — L97522 Non-pressure chronic ulcer of other part of left foot with fat layer exposed: Secondary | ICD-10-CM | POA: Insufficient documentation

## 2020-11-11 DIAGNOSIS — E11621 Type 2 diabetes mellitus with foot ulcer: Secondary | ICD-10-CM | POA: Insufficient documentation

## 2020-11-11 DIAGNOSIS — L97529 Non-pressure chronic ulcer of other part of left foot with unspecified severity: Secondary | ICD-10-CM | POA: Diagnosis present

## 2020-11-11 DIAGNOSIS — Z89421 Acquired absence of other right toe(s): Secondary | ICD-10-CM | POA: Diagnosis not present

## 2020-11-11 DIAGNOSIS — E1142 Type 2 diabetes mellitus with diabetic polyneuropathy: Secondary | ICD-10-CM | POA: Insufficient documentation

## 2020-11-16 NOTE — Progress Notes (Signed)
GAILEN, LIBERA ([id number]) Visit Report for 11/11/2020 Arrival Information Details Patient Name: Date of Service: [name], [place] [name] 11/11/2020 10:30 A M Medical Record Number: [id number] Patient Account Number: [id number] Date of Birth/Sex: Treating RN: 1976/04/20 (45 y.o. Lorette Ang, Meta.Reding Primary Care Atilano Covelli: PRA CTICE, DA [place] Crane Other Clinician: Referring Emme Rosenau: Treating Denece Shearer/Extender: Linton Ham PRA CTICE, DA Judge Stall in Treatment: 2 Visit Information History Since Last Visit Added or deleted any medications: No Patient Arrived: Cane Any new allergies or adverse reactions: No Arrival Time: 10:40 Had a fall or experienced change in No Accompanied By: self activities of daily living that may affect Transfer Assistance: None risk of falls: Patient Identification Verified: Yes Signs or symptoms of abuse/neglect since No Secondary Verification Process Completed: Yes last visito Patient Requires Transmission-Based Precautions: No Hospitalized since last visit: Yes Patient Has Alerts: Yes Implantable device outside of the clinic No Patient Alerts: Patient on Blood Thinner excluding 05/2020 ABI:1.11 TBI1.08 cellular tissue based products placed in the center since last visit: Has Dressing in Place as Prescribed: Yes Has Footwear/Offloading in Place as Yes Prescribed: Left: Surgical Shoe with Pressure Relief Insole Pain Present Now: No Electronic Signature(s) Signed: 11/11/2020 5:10:56 PM By: [name] Entered By: [name] on 11/11/2020 10:47:03 -------------------------------------------------------------------------------- Encounter Discharge Information Details Patient Name: Date of Service: [name], [name] 11/11/2020 10:30 A M Medical Record Number: [id number] Patient Account Number: [id number] Date of Birth/Sex: Treating RN: August 18, 1976 (45 y.o. [name] Primary Care Minahil Quinlivan: PRA CTICE, DA [place] Crane Other Clinician: Referring  Tyese Finken: Treating Isacc Turney/Extender: Linton Ham PRA CTICE, DA Judge Stall in Treatment: 2 Encounter Discharge Information Items Post Procedure Vitals Discharge Condition: Stable Temperature (F): 97.3 Ambulatory Status: Cane Pulse (bpm): 96 Discharge Destination: Home Respiratory Rate (breaths/min): 16 Transportation: Private Auto Blood Pressure (mmHg): 117/81 Accompanied By: self Schedule Follow-up Appointment: Yes Clinical Summary of Care: Electronic Signature(s) Signed: 11/11/2020 5:10:56 PM By: [name] Entered By: [name] on 11/11/2020 13:40:16 -------------------------------------------------------------------------------- Lower Extremity Assessment Details Patient Name: Date of Service: [name] [name] 11/11/2020 10:30 A M Medical Record Number: [id number] Patient Account Number: [id number] Date of Birth/Sex: Treating RN: [birth year] (45 y.o. [name] Primary Care Katelyn Kohlmeyer: PRA CTICE, DA [place] Crane Other Clinician: Referring Ashe Graybeal: Treating Linnie Delgrande/Extender: Linton Ham PRA CTICE, DA [place] Crane Weeks in Treatment: 2 Edema Assessment Assessed: [Left: Yes] [Right: No] Edema: [Left: N] [Right: o] Calf Left: Right: Point of Measurement: 35 cm From Medial Instep 34 cm Ankle Left: Right: Point of Measurement: 10 cm From Medial Instep 22 cm Vascular Assessment Pulses: Dorsalis Pedis Palpable: [Left:Yes] Electronic Signature(s) Signed: 11/11/2020 5:10:56 PM By: [name] Entered By: [name] on 11/11/2020 10:49:11 -------------------------------------------------------------------------------- Multi Wound Chart Details Patient Name: Date of Service: [name], [name] 11/11/2020 10:30 A M Medical Record Number: [id number] Patient Account Number: [id number] Date of Birth/Sex: Treating RN: 1976/06/04 (45 y.o. [name] Primary Care Kavan Devan: PRA CTICE, DA [place] Crane Other Clinician: Referring Magaly Pollina: Treating  Lillyann Ahart/Extender: Linton Ham PRA CTICE, DA [place] Crane Weeks in Treatment: 2 Vital Signs Height(in): 70 Capillary Blood Glucose(mg/dl): 112 Weight(lbs): 213 Pulse(bpm): 96 Body Mass Index(BMI): 31 Blood Pressure(mmHg): 117/81 Temperature(F): 97.3 Respiratory Rate(breaths/min): 16 Photos: [1:No Photos Left T Great oe] [N/A:N/A N/A] Wound Location: [1:Trauma] [N/A:N/A] Wounding Event: [1:Diabetic Wound/Ulcer of the Lower] [N/A:N/A] Primary Etiology: [1:Extremity Congestive Heart Failure, Coronary] [N/A:N/A] Comorbid History: [1:Artery Disease, Hypertension, Myocardial Infarction, Type II Diabetes, Osteomyelitis, Neuropathy 01/28/2020] [N/A:N/A] Date Acquired: [1:2] [N/A:N/A] Weeks of Treatment: [1:Open] [N/A:N/A] Wound  Status: [1:0.1x0.1x0.1] [N/A:N/A] Measurements L x W x D (cm) [1:0.008] [N/A:N/A] A (cm) : rea [1:0.001] [N/A:N/A] Volume (cm) : [1:87.30%] [N/A:N/A] % Reduction in A [1:rea: 96.80%] [N/A:N/A] % Reduction in Volume: [1:Grade 2] [N/A:N/A] Classification: [1:Medium] [N/A:N/A] Exudate A mount: [1:Serosanguineous] [N/A:N/A] Exudate Type: [1:red, [place]] [N/A:N/A] Exudate Color: [1:Distinct, outline attached] [N/A:N/A] Wound Margin: [1:None Present (0%)] [N/A:N/A] Granulation A mount: [1:None Present (0%)] [N/A:N/A] Necrotic A mount: [1:Fat Layer (Subcutaneous Tissue): Yes N/A] Exposed Structures: [1:Fascia: No Tendon: No Muscle: No Joint: No Bone: No None] [N/A:N/A] Epithelialization: [1:Debridement - Excisional] [N/A:N/A] Debridement: Pre-procedure Verification/Time Out 11:00 [N/A:N/A] Taken: [1:Lidocaine 5% topical ointment] [N/A:N/A] Pain Control: [1:Callus, Subcutaneous] [N/A:N/A] Tissue Debrided: [1:Skin/Subcutaneous Tissue] [N/A:N/A] Level: [1:2.25] [N/A:N/A] Debridement A (sq cm): [1:rea Curette] [N/A:N/A] Instrument: [1:Minimum] [N/A:N/A] Bleeding: [1:Pressure] [N/A:N/A] Hemostasis A chieved: [1:0] [N/A:N/A] Procedural Pain: [1:0] [N/A:N/A] Post  Procedural Pain: [1:Procedure was tolerated well] [N/A:N/A] Debridement Treatment Response: [1:0.6x0.8x0.2] [N/A:N/A] Post Debridement Measurements L x W x D (cm) [1:0.075] [N/A:N/A] Post Debridement Volume: (cm) [1:callous over wound bed and periwound. N/A] Assessment Notes: [1:Debridement] [N/A:N/A] Treatment Notes Electronic Signature(s) Signed: 11/11/2020 4:09:21 PM By: Linton Ham MD Signed: 11/16/2020 12:00:41 PM By: Baruch Gouty RN, BSN Entered By: Linton Ham on 11/11/2020 11:18:11 -------------------------------------------------------------------------------- Multi-Disciplinary Care Plan Details Patient Name: Date of Service: [name], [name] 11/11/2020 10:30 A M Medical Record Number: [id number] Patient Account Number: [id number] Date of Birth/Sex: Treating RN: 06-06-1976 (45 y.o. [name] Primary Care Luismanuel Corman: PRA CTICE, DA [place] Crane Other Clinician: Referring Nyilah Kight: Treating Giovana Faciane/Extender: Linton Ham PRA CTICE, DA Judge Stall in Treatment: 2 Active Inactive Nutrition Nursing Diagnoses: Impaired glucose control: actual or potential Potential for alteratiion in Nutrition/Potential for imbalanced nutrition Goals: Patient/caregiver will maintain therapeutic glucose control Date Initiated: 10/25/2020 Target Resolution Date: 11/22/2020 Goal Status: Active Interventions: Assess patient nutrition upon admission and as needed per policy Treatment Activities: Patient referred to Primary Care Physician for further nutritional evaluation : 10/25/2020 Notes: Wound/Skin Impairment Nursing Diagnoses: Impaired tissue integrity Knowledge deficit related to ulceration/compromised skin integrity Goals: Patient/caregiver will verbalize understanding of skin care regimen Date Initiated: 10/25/2020 Target Resolution Date: 11/22/2020 Goal Status: Active Ulcer/skin breakdown will have a volume reduction of 30% by week 4 Date Initiated:  10/25/2020 Target Resolution Date: 11/22/2020 Goal Status: Active Interventions: Assess patient/caregiver ability to obtain necessary supplies Assess patient/caregiver ability to perform ulcer/skin care regimen upon admission and as needed Assess ulceration(s) every visit Provide education on smoking Treatment Activities: Skin care regimen initiated : 10/25/2020 Topical wound management initiated : 10/25/2020 Notes: Electronic Signature(s) Signed: 11/16/2020 12:00:41 PM By: Baruch Gouty RN, BSN Entered By: Baruch Gouty on 11/11/2020 11:04:10 -------------------------------------------------------------------------------- Pain Assessment Details Patient Name: Date of Service: [name], [name] 11/11/2020 10:30 A M Medical Record Number: [id number] Patient Account Number: [id number] Date of Birth/Sex: Treating RN: 1975/12/31 (45 y.o. [name] Primary Care Andrew Soria: PRA CTICE, DA [place] Crane Other Clinician: Referring Timmey Lamba: Treating Masiyah Jorstad/Extender: Linton Ham PRA CTICE, DA [place] Crane Weeks in Treatment: 2 Active Problems Location of Pain Severity and Description of Pain Patient Has Paino No Site Locations Rate the pain. Rate the pain. Current Pain Level: 0 Pain Management and Medication Current Pain Management: Medication: No Cold Application: No Rest: No Massage: No Activity: No T.E.N.S.: No Heat Application: No Leg drop or elevation: No Is the Current Pain Management Adequate: Adequate How does your wound impact your activities of daily livingo Sleep: No Bathing: No Appetite: No Relationship With Others: No Bladder Continence: No Emotions: No Bowel  Continence: No Work: No Toileting: No Drive: No Dressing: No Hobbies: No Engineer, maintenance) Signed: 11/11/2020 5:10:56 PM By: [name] Entered By: [name] on 11/11/2020 10:48:55 -------------------------------------------------------------------------------- Patient/Caregiver  Education Details Patient Name: Date of Service: [name], [name] 1/14/2022andnbsp10:30 A M Medical Record Number: [id number] Patient Account Number: [id number] Date of Birth/Gender: Treating RN: 04-Jun-1976 (45 y.o. [name] Primary Care Physician: PRA CTICE, DA [place] Crane Other Clinician: Referring Physician: Treating Physician/Extender: Linton Ham PRA [place], DA Judge Stall in Treatment: 2 Education Assessment Education Provided To: Patient Education Topics Provided Offloading: Methods: Explain/Verbal Responses: Reinforcements needed, State content correctly Smoking and Wound Healing: Methods: Explain/Verbal Responses: Reinforcements needed, State content correctly Wound/Skin Impairment: Methods: Explain/Verbal Responses: Reinforcements needed, State content correctly Electronic Signature(s) Signed: 11/16/2020 12:00:41 PM By: Baruch Gouty RN, BSN Entered By: Baruch Gouty on 11/11/2020 11:04:42 -------------------------------------------------------------------------------- Wound Assessment Details Patient Name: Date of Service: [name], [name] 11/11/2020 10:30 A M Medical Record Number: [id number] Patient Account Number: [id number] Date of Birth/Sex: Treating RN: 08/10/1976 (45 y.o. [name] Primary Care Stpehen Petitjean: PRA CTICE, DA [place] Crane Other Clinician: Referring Rudolf Blizard: Treating Anely Spiewak/Extender: Linton Ham PRA CTICE, DA [place] Crane Weeks in Treatment: 2 Wound Status Wound Number: 1 Primary Diabetic Wound/Ulcer of the Lower Extremity Etiology: Wound Location: Left T Great oe Wound Open Wounding Event: Trauma Status: Date Acquired: 01/28/2020 Comorbid Congestive Heart Failure, Coronary Artery Disease, Hypertension, Weeks Of Treatment: 2 History: Myocardial Infarction, Type II Diabetes, Osteomyelitis, Neuropathy Clustered Wound: No Photos Photo Uploaded By: Mikeal Hawthorne on 11/16/2020 10:45:27 Wound Measurements Length:  (cm) 0.1 Width: (cm) 0.1 Depth: (cm) 0.1 Area: (cm) 0.008 Volume: (cm) 0.001 % Reduction in Area: 87.3% % Reduction in Volume: 96.8% Epithelialization: None Tunneling: No Undermining: No Wound Description Classification: Grade 2 Wound Margin: Distinct, outline attached Exudate Amount: Medium Exudate Type: Serosanguineous Exudate Color: red, Pae Foul Odor After Cleansing: No Slough/Fibrino Yes Wound Bed Granulation Amount: None Present (0%) Exposed Structure Necrotic Amount: None Present (0%) Fascia Exposed: No Fat Layer (Subcutaneous Tissue) Exposed: Yes Tendon Exposed: No Muscle Exposed: No Joint Exposed: No Bone Exposed: No Assessment Notes callous over wound bed and periwound. Treatment Notes Wound #1 (Toe Great) Wound Laterality: Left Cleanser Normal Saline Discharge Instruction: Cleanse the wound with Normal Saline prior to applying a clean dressing using gauze sponges, not tissue or cotton balls. Peri-Wound Care Topical Primary Dressing Promogran Prisma Matrix, 4.34 (sq in) (silver collagen) Discharge Instruction: Moisten collagen with saline or hydrogel Secondary Dressing Woven Gauze Sponges 2x2 in Discharge Instruction: Apply over primary dressing as directed. Optifoam Non-Adhesive Dressing, 4x4 in Discharge Instruction: Apply over primary dressing cut to make foam donut to help offload Secured With Conforming Stretch Gauze Bandage, Sterile 2x75 (in/in) Discharge Instruction: Secure with stretch gauze as directed. Paper Tape, 1x10 (in/yd) Discharge Instruction: Secure dressing with tape as directed. Compression Wrap Compression Stockings Add-Ons Electronic Signature(s) Signed: 11/11/2020 5:10:56 PM By: [name] Entered By: [name] on 11/11/2020 10:49:51 -------------------------------------------------------------------------------- Vitals Details Patient Name: Date of Service: [name], [name] 11/11/2020 10:30 A M Medical Record  Number: [id number] Patient Account Number: [id number] Date of Birth/Sex: Treating RN: Dec 30, 1975 (45 y.o. [name] Primary Care Kashis Penley: PRA CTICE, DA [place] Crane Other Clinician: Referring Zelda Reames: Treating Kienan Doublin/Extender: Linton Ham PRA CTICE, DA [place] Crane Weeks in Treatment: 2 Vital Signs Time Taken: 10:40 Temperature (F): 97.3 Height (in): 70 Pulse (bpm): 96 Weight (lbs): 213 Respiratory Rate (breaths/min): 16 Body Mass Index (BMI): 30.6 Blood Pressure (mmHg): 117/81 Capillary  Blood Glucose (mg/dl): 112 Reference Range: 80 - 120 mg / dl Electronic Signature(s) Signed: 11/11/2020 5:10:56 PM By: [name] Entered By: [name] on 11/11/2020 10:48:44
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
## 2020-11-18 ENCOUNTER — Encounter (HOSPITAL_BASED_OUTPATIENT_CLINIC_OR_DEPARTMENT_OTHER): Payer: Medicare Other | Admitting: Internal Medicine

## 2020-11-18 ENCOUNTER — Other Ambulatory Visit: Payer: Self-pay

## 2020-11-18 DIAGNOSIS — L97522 Non-pressure chronic ulcer of other part of left foot with fat layer exposed: Secondary | ICD-10-CM | POA: Diagnosis not present

## 2020-11-18 DIAGNOSIS — Z89421 Acquired absence of other right toe(s): Secondary | ICD-10-CM | POA: Diagnosis not present

## 2020-11-18 DIAGNOSIS — E11621 Type 2 diabetes mellitus with foot ulcer: Secondary | ICD-10-CM | POA: Diagnosis not present

## 2020-11-18 DIAGNOSIS — E1142 Type 2 diabetes mellitus with diabetic polyneuropathy: Secondary | ICD-10-CM | POA: Diagnosis not present
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
## 2020-11-18 NOTE — Progress Notes (Signed)
Derrick Mosley ([id number]) Visit Report for 11/18/2020 Arrival Information Details Patient Name: Date of Service: Derrick Mosley, [place] Derrick L. 11/18/2020 2:00 PM Medical Record Number: [id number] Patient Account Number: [id number] Date of Birth/Sex: Treating RN: Feb 03, 1976 (45 y.o. Derrick Mosley Primary Care Derrick Mosley: PRA CTICE, Derrick [place] Other Clinician: Referring Derrick Mosley: Treating Derrick Mosley/Extender: Derrick Mosley PRA CTICE, Derrick Mosley in Treatment: 3 Visit Information History Since Last Visit Added or deleted any medications: No Patient Arrived: [place] Any new allergies or adverse reactions: No Arrival Time: 13:55 Had a fall or experienced change in No Accompanied By: self activities of daily living that may affect Transfer Assistance: None risk of falls: Patient Identification Verified: Yes Signs or symptoms of abuse/neglect since last visito No Secondary Verification Process Completed: Yes Hospitalized since last visit: No Patient Requires Transmission-Based Precautions: No Implantable device outside of the clinic excluding No Patient Has Alerts: Yes cellular tissue based products placed in the center Patient Alerts: Patient on Blood Thinner since last visit: 05/2020 ABI:1.11 TBI1.08 Pain Present Now: No Electronic Signature(s) Signed: 11/18/2020 3:06:23 PM By: Derrick Mosley EMT/HBOT/SD Entered By: Derrick Mosley on 11/18/2020 14:04:10 -------------------------------------------------------------------------------- Lower Extremity Assessment Details Patient Name: Date of Service: Derrick Mosley Derrick L. 11/18/2020 2:00 PM Medical Record Number: [id number] Patient Account Number: [id number] Date of Birth/Sex: Treating RN: 1976-08-15 (45 y.o. Derrick Mosley Primary Care Derrick Mosley: PRA CTICE, Derrick [place] Other Clinician: Referring Derrick Mosley: Treating Derrick Mosley/Extender: Derrick Mosley PRA CT[place], Derrick [place] Weeks in Treatment: 3 Edema Assessment Assessed: [Left: No]  [Right: No] Edema: [Left: N] [Right: o] Calf Left: Right: Point of Measurement: 35 cm From Medial Instep 38 cm Ankle Left: Right: Point of Measurement: 10 cm From Medial Instep 24.8 cm Vascular Assessment Pulses: Dorsalis Pedis Palpable: [Left:Yes] Electronic Signature(s) Signed: 11/18/2020 3:06:23 PM By: Derrick Mosley EMT/HBOT/SD Signed: 11/18/2020 4:31:51 PM By: Baruch Gouty RN, BSN Entered By: Derrick Mosley on 11/18/2020 14:07:18 -------------------------------------------------------------------------------- Multi Wound Chart Details Patient Name: Date of Service: Derrick Mosley Derrick L. 11/18/2020 2:00 PM Medical Record Number: [id number] Patient Account Number: [id number] Date of Birth/Sex: Treating RN: Feb 21, 1976 (45 y.o. Derrick Mosley Primary Care Derrick Mosley: PRA CTICE, Derrick [place] Other Clinician: Referring Derrick Mosley: Treating Derrick Mosley/Extender: Derrick Mosley PRA CT[place], Derrick [place] Weeks in Treatment: 3 Vital Signs Height(in): 70 Pulse(bpm): 71 Weight(lbs): 213 Blood Pressure(mmHg): 119/81 Body Mass Index(BMI): 31 Temperature(F): 98.1 Respiratory Rate(breaths/min): 19 Photos: [1:No Photos Left T Great oe] [N/A:N/A N/A] Wound Location: [1:Trauma] [N/A:N/A] Wounding Event: [1:Diabetic Wound/Ulcer of the Lower] [N/A:N/A] Primary Etiology: [1:Extremity Congestive Heart Failure, Coronary] [N/A:N/A] Comorbid History: [1:Artery Disease, Hypertension, Myocardial Infarction, Type II Diabetes, Osteomyelitis, Neuropathy 01/28/2020] [N/A:N/A] Date Acquired: [1:3] [N/A:N/A] Weeks of Treatment: [1:Open] [N/A:N/A] Wound Status: [1:0.1x0.1x0.1] [N/A:N/A] Measurements L x W x D (cm) [1:0.008] [N/A:N/A] A (cm) : rea [1:0.001] [N/A:N/A] Volume (cm) : [1:87.30%] [N/A:N/A] % Reduction in A [1:rea: 96.80%] [N/A:N/A] % Reduction in Volume: [1:Grade 2] [N/A:N/A] Classification: [1:None Present] [N/A:N/A] Exudate A mount: [1:Distinct, outline attached] [N/A:N/A] Wound Margin:  [1:None Present (0%)] [N/A:N/A] Granulation A mount: [1:None Present (0%)] [N/A:N/A] Necrotic A mount: [1:Fascia: No] [N/A:N/A] Exposed Structures: [1:Fat Layer (Subcutaneous Tissue): No Tendon: No Muscle: No Joint: No Bone: No Large (67-100%)] [N/A:N/A] Epithelialization: [1:Debridement - Selective/Open Wound] [N/A:N/A] Debridement: Pre-procedure Verification/Time Out 14:55 [N/A:N/A] Taken: [1:Callus] [N/A:N/A] Tissue Debrided: [1:Skin/Epidermis] [N/A:N/A] Level: [1:1] [N/A:N/A] Debridement A (sq cm): [1:rea Curette] [N/A:N/A] Instrument: [1:Minimum] [N/A:N/A] Bleeding: [1:Pressure] [N/A:N/A] Hemostasis A chieved: [1:0] [N/A:N/A] Procedural Pain: [1:0] [N/A:N/A] Post Procedural Pain: [1:Procedure was tolerated well] [N/A:N/A] Debridement  Treatment Response: [1:0.1x0.1x0.1] [N/A:N/A] Post Debridement Measurements L x W x D (cm) [1:0.001] [N/A:N/A] Post Debridement Volume: (cm) [1:Debridement] [N/A:N/A] Treatment Notes Electronic Signature(s) Signed: 11/18/2020 4:10:40 PM By: Derrick Ham MD Signed: 11/18/2020 4:31:51 PM By: Baruch Gouty RN, BSN Entered By: Derrick Mosley on 11/18/2020 15:44:38 -------------------------------------------------------------------------------- Multi-Disciplinary Care Plan Details Patient Name: Date of Service: Derrick Mosley, Mosley Derrick L. 11/18/2020 2:00 PM Medical Record Number: [id number] Patient Account Number: [id number] Date of Birth/Sex: Treating RN: [birth year] (45 y.o. Derrick Mosley Primary Care Derrick Mosley: PRA CTICE, Derrick [place] Other Clinician: Referring Derrick Mosley: Treating Derrick Mosley/Extender: Derrick Mosley PRA Derrick Mosley, Derrick Mosley in Treatment: 3 Active Inactive Electronic Signature(s) Signed: 11/18/2020 4:31:51 PM By: Baruch Gouty RN, BSN Entered By: Baruch Mosley on 11/18/2020 14:50:21 -------------------------------------------------------------------------------- Pain Assessment Details Patient Name: Date of  Service: Derrick Mosley, Mosley Derrick L. 11/18/2020 2:00 PM Medical Record Number: [id number] Patient Account Number: [id number] Date of Birth/Sex: Treating RN: 29-Oct-1976 (45 y.o. Derrick Mosley Primary Care Quaniya Damas: PRA CTICE, Derrick [place] Other Clinician: Referring Laresha Bacorn: Treating Joane Postel/Extender: Derrick Mosley PRA [place], Derrick [place] Weeks in Treatment: 3 Active Problems Location of Pain Severity and Description of Pain Patient Has Paino No Site Locations With Dressing Change: No Pain Management and Medication Current Pain Management: Electronic Signature(s) Signed: 11/18/2020 3:06:23 PM By: Derrick Mosley EMT/HBOT/SD Signed: 11/18/2020 4:31:51 PM By: Baruch Gouty RN, BSN Entered By: Derrick Mosley on 11/18/2020 14:04:41 -------------------------------------------------------------------------------- Patient/Caregiver Education Details Patient Name: Date of Service: Derrick Mosley, Mosley Derrick L. 1/21/2022andnbsp2:00 PM Medical Record Number: [id number] Patient Account Number: [id number] Date of Birth/Gender: Treating RN: 01-25-1976 (45 y.o. Derrick Mosley Primary Care Physician: PRA CTICE, Derrick [place] Other Clinician: Referring Physician: Treating Physician/Extender: Derrick Mosley PRA [place], Derrick Mosley in Treatment: 3 Education Assessment Education Provided To: Patient Education Topics Provided Smoking and Wound Healing: Methods: Explain/Verbal Responses: Reinforcements needed, State content correctly Wound/Skin Impairment: Methods: Explain/Verbal Responses: Reinforcements needed, State content correctly Electronic Signature(s) Signed: 11/18/2020 4:31:51 PM By: Baruch Gouty RN, BSN Entered By: Baruch Mosley on 11/18/2020 14:50:02 -------------------------------------------------------------------------------- Wound Assessment Details Patient Name: Date of Service: Derrick Mosley Derrick L. 11/18/2020 2:00 PM Medical Record Number: [id number] Patient Account Number:  [id number] Date of Birth/Sex: Treating RN: 11/21/1975 (45 y.o. Derrick Mosley Primary Care Hezzie Karim: PRA CTICE, Derrick [place] Other Clinician: Referring Mega Kinkade: Treating Tyrica Afzal/Extender: Derrick Mosley PRA CTICE, Derrick [place] Weeks in Treatment: 3 Wound Status Wound Number: 1 Primary Diabetic Wound/Ulcer of the Lower Extremity Etiology: Wound Location: Left T Great oe Wound Open Wounding Event: Trauma Status: Date Acquired: 01/28/2020 Comorbid Congestive Heart Failure, Coronary Artery Disease, Hypertension, Weeks Of Treatment: 3 History: Myocardial Infarction, Type II Diabetes, Osteomyelitis, Neuropathy Clustered Wound: No Wound Measurements Length: (cm) 0.1 Width: (cm) 0.1 Depth: (cm) 0.1 Area: (cm) 0.008 Volume: (cm) 0.001 Wound Description Classification: Grade 2 Wound Margin: Distinct, outline attached Exudate Amount: None Present Foul Odor After Cleansing: Slough/Fibrino % Reduction in Area: 87.3% % Reduction in Volume: 96.8% Epithelialization: Large (67-100%) Tunneling: No Undermining: No No No Wound Bed Granulation Amount: None Present (0%) Exposed Structure Necrotic Amount: None Present (0%) Fascia Exposed: No Fat Layer (Subcutaneous Tissue) Exposed: No Tendon Exposed: No Muscle Exposed: No Joint Exposed: No Bone Exposed: No Electronic Signature(s) Signed: 11/18/2020 3:06:23 PM By: Derrick Mosley EMT/HBOT/SD Signed: 11/18/2020 4:31:51 PM By: Baruch Gouty RN, BSN Entered By: Derrick Mosley on 11/18/2020 14:08:58 -------------------------------------------------------------------------------- [place] Details Patient Name: Date of Service: Derrick Mosley Derrick L. 11/18/2020 2:00 PM Medical Record Number: [id number] Patient Account Number: [id number]  Date of Birth/Sex: Treating RN: 1976/01/21 (45 y.o. Derrick Mosley Primary Care Aleka Twitty: PRA CTICE, Derrick [place] Other Clinician: Referring Jushua Waltman: Treating Garey Alleva/Extender: Derrick Mosley PRA  CTICE, Derrick [place] Weeks in Treatment: 3 Vital Signs Time Taken: 14:00 Temperature (F): 98.1 Height (in): 70 Pulse (bpm): 87 Weight (lbs): 213 Respiratory Rate (breaths/min): 19 Body Mass Index (BMI): 30.6 Blood Pressure (mmHg): 119/81 Reference Range: 80 - 120 mg / dl Electronic Signature(s) Signed: 11/18/2020 3:06:23 PM By: Derrick Mosley EMT/HBOT/SD Entered By: Derrick Mosley on 11/18/2020 14:04:33

## 2020-11-25 ENCOUNTER — Encounter (HOSPITAL_BASED_OUTPATIENT_CLINIC_OR_DEPARTMENT_OTHER): Payer: Medicare Other | Admitting: Internal Medicine

## 2020-11-25 ENCOUNTER — Other Ambulatory Visit: Payer: Self-pay

## 2020-11-25 DIAGNOSIS — E11621 Type 2 diabetes mellitus with foot ulcer: Secondary | ICD-10-CM | POA: Diagnosis not present

## 2020-11-25 DIAGNOSIS — Z89421 Acquired absence of other right toe(s): Secondary | ICD-10-CM | POA: Diagnosis not present

## 2020-11-25 DIAGNOSIS — L97522 Non-pressure chronic ulcer of other part of left foot with fat layer exposed: Secondary | ICD-10-CM | POA: Diagnosis not present

## 2020-11-25 DIAGNOSIS — E1142 Type 2 diabetes mellitus with diabetic polyneuropathy: Secondary | ICD-10-CM | POA: Diagnosis not present
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
## 2020-11-25 NOTE — Progress Notes (Signed)
Derrick Mosley, Derrick Mosley ([id number]) Visit Report for 11/25/2020 Arrival Information Details Patient Name: Date of Service: Derrick Mosley, [name] 11/25/2020 10:15 A M Medical Record Number: [id number] Patient Account Number: [id number] Date of Birth/Sex: Treating RN: [birth year] (45 y.o. [name] Primary Care Pressley Tadesse: PRA CTICE, DA [place] Crane Other Clinician: Referring Marri Mcneff: Treating Etta Gassett/Extender: Linton Ham PRA CTICE, DA Judge Stall in Treatment: 4 Visit Information History Since Last Visit Added or deleted any medications: No Patient Arrived: Cane Any new allergies or adverse reactions: No Arrival Time: 10:17 Had a fall or experienced change in No Accompanied By: self activities of daily living that may affect Transfer Assistance: None risk of falls: Patient Identification Verified: Yes Signs or symptoms of abuse/neglect since last visito No Secondary Verification Process Completed: Yes Hospitalized since last visit: No Patient Requires Transmission-Based Precautions: No Implantable device outside of the clinic excluding No Patient Has Alerts: Yes cellular tissue based products placed in the center Patient Alerts: Patient on Blood Thinner since last visit: 05/2020 ABI:1.11 TBI1.08 Has Dressing in Place as Prescribed: Yes Pain Present Now: No Electronic Signature(s) Signed: 11/25/2020 10:25:52 AM By: Sandre Kitty Entered By: Sandre Kitty on 11/25/2020 10:19:14 -------------------------------------------------------------------------------- Clinic Level of Care Assessment Details Patient Name: Date of Service: Derrick Bellini [name] 11/25/2020 10:15 A M Medical Record Number: [id number] Patient Account Number: [id number] Date of Birth/Sex: Treating RN: 03-06-1976 (45 y.o. [name] Primary Care Drew Herman: PRA CTICE, DA [place] Crane Other Clinician: Referring Akilah Cureton: Treating Timia Casselman/Extender: Linton Ham PRA CTICE, DA Judge Stall in Treatment:  4 Clinic Level of Care Assessment Items TOOL 4 Quantity Score '[]'$  - 0 Use when only an EandM is performed on FOLLOW-UP visit ASSESSMENTS - Nursing Assessment / Reassessment X- 1 10 Reassessment of Co-morbidities (includes updates in patient status) X- 1 5 Reassessment of Adherence to Treatment Plan ASSESSMENTS - Wound and Skin A ssessment / Reassessment X - Simple Wound Assessment / Reassessment - one wound 1 5 '[]'$  - 0 Complex Wound Assessment / Reassessment - multiple wounds '[]'$  - 0 Dermatologic / Skin Assessment (not related to wound area) ASSESSMENTS - Focused Assessment '[]'$  - 0 Circumferential Edema Measurements - multi extremities '[]'$  - 0 Nutritional Assessment / Counseling / Intervention X- 1 5 Lower Extremity Assessment (monofilament, tuning fork, pulses) '[]'$  - 0 Peripheral Arterial Disease Assessment (using hand held doppler) ASSESSMENTS - Ostomy and/or Continence Assessment and Care '[]'$  - 0 Incontinence Assessment and Management '[]'$  - 0 Ostomy Care Assessment and Management (repouching, etc.) PROCESS - Coordination of Care X - Simple Patient / Family Education for ongoing care 1 15 '[]'$  - 0 Complex (extensive) Patient / Family Education for ongoing care X- 1 10 Staff obtains Programmer, systems, Records, T Results / Process Orders est '[]'$  - 0 Staff telephones HHA, Nursing Homes / Clarify orders / etc '[]'$  - 0 Routine Transfer to another Facility (non-emergent condition) '[]'$  - 0 Routine Hospital Admission (non-emergent condition) '[]'$  - 0 New Admissions / Biomedical engineer / Ordering NPWT Apligraf, etc. , '[]'$  - 0 Emergency Hospital Admission (emergent condition) X- 1 10 Simple Discharge Coordination '[]'$  - 0 Complex (extensive) Discharge Coordination PROCESS - Special Needs '[]'$  - 0 Pediatric / Minor Patient Management '[]'$  - 0 Isolation Patient Management '[]'$  - 0 Hearing / Language / Visual special needs '[]'$  - 0 Assessment of Community assistance (transportation, D/C planning,  etc.) '[]'$  - 0 Additional assistance / Altered mentation '[]'$  - 0 Support Surface(s) Assessment (bed, cushion, seat, etc.) INTERVENTIONS - Wound Cleansing / Measurement  X - Simple Wound Cleansing - one wound 1 5 '[]'$  - 0 Complex Wound Cleansing - multiple wounds X- 1 5 Wound Imaging (photographs - any number of wounds) '[]'$  - 0 Wound Tracing (instead of photographs) '[]'$  - 0 Simple Wound Measurement - one wound '[]'$  - 0 Complex Wound Measurement - multiple wounds INTERVENTIONS - Wound Dressings X - Small Wound Dressing one or multiple wounds 1 10 '[]'$  - 0 Medium Wound Dressing one or multiple wounds '[]'$  - 0 Large Wound Dressing one or multiple wounds '[]'$  - 0 Application of Medications - topical '[]'$  - 0 Application of Medications - injection INTERVENTIONS - Miscellaneous '[]'$  - 0 External ear exam '[]'$  - 0 Specimen Collection (cultures, biopsies, blood, body fluids, etc.) '[]'$  - 0 Specimen(s) / Culture(s) sent or taken to Lab for analysis '[]'$  - 0 Patient Transfer (multiple staff / Civil Service fast streamer / Similar devices) '[]'$  - 0 Simple Staple / Suture removal (25 or less) '[]'$  - 0 Complex Staple / Suture removal (26 or more) '[]'$  - 0 Hypo / Hyperglycemic Management (close monitor of Blood Glucose) '[]'$  - 0 Ankle / Brachial Index (ABI) - do not check if billed separately X- 1 5 Vital Signs Has the patient been seen at the hospital within the last three years: Yes Total Score: 85 Level Of Care: New/Established - Level 3 Electronic Signature(s) Signed: 11/25/2020 5:52:16 PM By: Baruch Gouty RN, BSN Entered By: Baruch Gouty on 11/25/2020 10:49:03 -------------------------------------------------------------------------------- Encounter Discharge Information Details Patient Name: Date of Service: Derrick Mosley, [name] 11/25/2020 10:15 A M Medical Record Number: [id number] Patient Account Number: [id number] Date of Birth/Sex: Treating RN: 06/12/1976 (45 y.o. [name] Primary Care Dyrell Tuccillo: PRA  CTICE, DA [place] Crane Other Clinician: Referring Hiroko Tregre: Treating Javon Snee/Extender: Linton Ham PRA [place], DA Judge Stall in Treatment: 4 Encounter Discharge Information Items Discharge Condition: Stable Ambulatory Status: Cane Discharge Destination: Home Transportation: Private Auto Accompanied By: self Schedule Follow-up Appointment: Yes Clinical Summary of Care: Patient Declined Electronic Signature(s) Signed: 11/25/2020 5:52:16 PM By: Baruch Gouty RN, BSN Entered By: Baruch Gouty on 11/25/2020 10:59:20 -------------------------------------------------------------------------------- Lower Extremity Assessment Details Patient Name: Date of Service: Derrick Mosley, [name] 11/25/2020 10:15 A M Medical Record Number: [id number] Patient Account Number: [id number] Date of Birth/Sex: Treating RN: 12-30-1975 (45 y.o. Janyth Contes Primary Care Dvontae Ruan: PRA CTICE, DA [place] Crane Other Clinician: Referring Kealii Thueson: Treating Marisela Line/Extender: Linton Ham PRA CTICE, DA [place] Crane Weeks in Treatment: 4 Edema Assessment Assessed: [Left: No] [Right: No] Edema: [Left: N] [Right: o] Calf Left: Right: Point of Measurement: 35 cm From Medial Instep 38 cm Ankle Left: Right: Point of Measurement: 10 cm From Medial Instep 24.8 cm Vascular Assessment Pulses: Dorsalis Pedis Palpable: [Left:Yes] Electronic Signature(s) Signed: 11/25/2020 5:52:57 PM By: Levan Hurst RN, BSN Entered By: Levan Hurst on 11/25/2020 10:31:03 -------------------------------------------------------------------------------- Multi Wound Chart Details Patient Name: Date of Service: Derrick Mosley, [name] 11/25/2020 10:15 A M Medical Record Number: [id number] Patient Account Number: [id number] Date of Birth/Sex: Treating RN: 01/29/1976 (45 y.o. [name] Primary Care Jericca Russett: PRA CTICE, DA [place] Crane Other Clinician: Referring Pius Byrom: Treating Nikira Kushnir/Extender: Linton Ham PRA CTICE, DA  [place] Crane Weeks in Treatment: 4 Vital Signs Height(in): 70 Pulse(bpm): 65 Weight(lbs): 213 Blood Pressure(mmHg): 154/97 Body Mass Index(BMI): 31 Temperature(F): 98.3 Respiratory Rate(breaths/min): 19 Photos: [1:No Photos Left T Great oe] [N/A:N/A N/A] Wound Location: [1:Trauma] [N/A:N/A] Wounding Event: [1:Diabetic Wound/Ulcer of the Lower] [N/A:N/A] Primary Etiology: [1:Extremity Congestive Heart Failure, Coronary] [N/A:N/A] Comorbid History: [1:Artery Disease, Hypertension, Myocardial Infarction,  Type II Diabetes, Osteomyelitis, Neuropathy 01/28/2020] [N/A:N/A] Date Acquired: [1:4] [N/A:N/A] Weeks of Treatment: [1:Open] [N/A:N/A] Wound Status: [1:0x0x0] [N/A:N/A] Measurements L x W x D (cm) [1:0] [N/A:N/A] A (cm) : rea [1:0] [N/A:N/A] Volume (cm) : [1:100.00%] [N/A:N/A] % Reduction in A rea: [1:100.00%] [N/A:N/A] % Reduction in Volume: [1:Grade 2] [N/A:N/A] Classification: [1:None Present] [N/A:N/A] Exudate A mount: [1:Thickened] [N/A:N/A] Wound Margin: [1:None Present (0%)] [N/A:N/A] Granulation A mount: [1:None Present (0%)] [N/A:N/A] Necrotic A mount: [1:Fascia: No] [N/A:N/A] Exposed Structures: [1:Fat Layer (Subcutaneous Tissue): No Tendon: No Muscle: No Joint: No Bone: No Large (67-100%)] [N/A:N/A] Treatment Notes Electronic Signature(s) Signed: 11/25/2020 5:33:44 PM By: Linton Ham MD Signed: 11/25/2020 5:52:16 PM By: Baruch Gouty RN, BSN Entered By: Linton Ham on 11/25/2020 11:54:56 -------------------------------------------------------------------------------- Pain Assessment Details Patient Name: Date of Service: Derrick Mosley, [name] 11/25/2020 10:15 A M Medical Record Number: [id number] Patient Account Number: [id number] Date of Birth/Sex: Treating RN: [birth year] (45 y.o. [name] Primary Care Zilpha Mcandrew: PRA CTICE, DA [place] Crane Other Clinician: Referring Waniya Hoglund: Treating Anish Vana/Extender: Linton Ham PRA CTICE, DA [place] Crane Weeks in  Treatment: 4 Active Problems Location of Pain Severity and Description of Pain Patient Has Paino No Site Locations Pain Management and Medication Current Pain Management: Electronic Signature(s) Signed: 11/25/2020 10:25:52 AM By: Sandre Kitty Signed: 11/25/2020 5:52:16 PM By: Baruch Gouty RN, BSN Entered By: Sandre Kitty on 11/25/2020 10:19:43 -------------------------------------------------------------------------------- Patient/Caregiver Education Details Patient Name: Date of Service: Derrick Mosley, [name] 1/28/2022andnbsp10:15 A M Medical Record Number: [id number] Patient Account Number: [id number] Date of Birth/Gender: Treating RN: Apr 09, 1976 (45 y.o. [name] Primary Care Physician: PRA CTICE, DA [place] Crane Other Clinician: Referring Physician: Treating Physician/Extender: Linton Ham PRA [place], DA Judge Stall in Treatment: 4 Education Assessment Education Provided To: Patient Education Topics Provided Offloading: Methods: Explain/Verbal Responses: Reinforcements needed, State content correctly Wound/Skin Impairment: Methods: Explain/Verbal Responses: Reinforcements needed, State content correctly Electronic Signature(s) Signed: 11/25/2020 5:52:16 PM By: Baruch Gouty RN, BSN Entered By: Baruch Gouty on 11/25/2020 10:48:28 -------------------------------------------------------------------------------- Wound Assessment Details Patient Name: Date of Service: Derrick Mosley, [name] 11/25/2020 10:15 A M Medical Record Number: [id number] Patient Account Number: [id number] Date of Birth/Sex: Treating RN: [birth year] (45 y.o. [name] Primary Care Rissie Sculley: PRA CTICE, DA [place] Crane Other Clinician: Referring Kentarius Partington: Treating Samrat Hayward/Extender: Linton Ham PRA CTICE, DA [place] Crane Weeks in Treatment: 4 Wound Status Wound Number: 1 Primary Diabetic Wound/Ulcer of the Lower Extremity Etiology: Wound Location: Left T Great oe Wound  Open Wounding Event: Trauma Status: Date Acquired: 01/28/2020 Comorbid Congestive Heart Failure, Coronary Artery Disease, Hypertension, Weeks Of Treatment: 4 History: Myocardial Infarction, Type II Diabetes, Osteomyelitis, Neuropathy Clustered Wound: No Wound Measurements Length: (cm) Width: (cm) Depth: (cm) Area: (cm) Volume: (cm) 0 % Reduction in Area: 100% 0 % Reduction in Volume: 100% 0 Epithelialization: Large (67-100%) 0 Tunneling: No 0 Undermining: No Wound Description Classification: Grade 2 Wound Margin: Thickened Exudate Amount: None Present Foul Odor After Cleansing: No Slough/Fibrino No Wound Bed Granulation Amount: None Present (0%) Exposed Structure Necrotic Amount: None Present (0%) Fascia Exposed: No Fat Layer (Subcutaneous Tissue) Exposed: No Tendon Exposed: No Muscle Exposed: No Joint Exposed: No Bone Exposed: No Electronic Signature(s) Signed: 11/25/2020 5:52:16 PM By: Baruch Gouty RN, BSN Signed: 11/25/2020 5:52:57 PM By: Levan Hurst RN, BSN Previous Signature: 11/25/2020 10:25:52 AM Version By: Sandre Kitty Entered By: Levan Hurst on 11/25/2020 10:31:51 -------------------------------------------------------------------------------- Vitals Details Patient Name: Date of Service: Derrick Mosley, [name] 11/25/2020 10:15 A M Medical Record Number: [id number] Patient  Account Number: [id number] Date of Birth/Sex: Treating RN: [birth year] (45 y.o. [name] Primary Care Chadley Dziedzic: Other Clinician: PRA CTICE, DA [place] Crane Referring Charm Stenner: Treating Chennel Olivos/Extender: Linton Ham PRA CTICE, DA [place] Crane Weeks in Treatment: 4 Vital Signs Time Taken: 10:19 Temperature (F): 98.3 Height (in): 70 Pulse (bpm): 93 Weight (lbs): 213 Respiratory Rate (breaths/min): 19 Body Mass Index (BMI): 30.6 Blood Pressure (mmHg): 154/97 Reference Range: 80 - 120 mg / dl Electronic Signature(s) Signed: 11/25/2020 10:25:52 AM By: Sandre Kitty Entered By: Sandre Kitty on 11/25/2020 10:19:37

## 2020-12-08 DIAGNOSIS — M545 Low back pain, unspecified: Secondary | ICD-10-CM | POA: Diagnosis not present

## 2020-12-09 DIAGNOSIS — Z981 Arthrodesis status: Secondary | ICD-10-CM | POA: Diagnosis not present

## 2020-12-09 DIAGNOSIS — Z4889 Encounter for other specified surgical aftercare: Secondary | ICD-10-CM | POA: Diagnosis not present

## 2020-12-14 DIAGNOSIS — M545 Low back pain, unspecified: Secondary | ICD-10-CM | POA: Diagnosis not present

## 2020-12-16 DIAGNOSIS — M545 Low back pain, unspecified: Secondary | ICD-10-CM | POA: Diagnosis not present

## 2020-12-20 DIAGNOSIS — M545 Low back pain, unspecified: Secondary | ICD-10-CM | POA: Diagnosis not present

## 2020-12-21 DIAGNOSIS — H34812 Central retinal vein occlusion, left eye, with macular edema: Secondary | ICD-10-CM | POA: Diagnosis not present

## 2020-12-23 DIAGNOSIS — M545 Low back pain, unspecified: Secondary | ICD-10-CM | POA: Diagnosis not present

## 2020-12-28 DIAGNOSIS — M545 Low back pain, unspecified: Secondary | ICD-10-CM | POA: Diagnosis not present

## 2021-01-03 DIAGNOSIS — M545 Low back pain, unspecified: Secondary | ICD-10-CM | POA: Diagnosis not present

## 2021-01-04 ENCOUNTER — Other Ambulatory Visit: Payer: Self-pay | Admitting: Internal Medicine

## 2021-01-04 DIAGNOSIS — E7849 Other hyperlipidemia: Secondary | ICD-10-CM | POA: Diagnosis not present

## 2021-01-04 DIAGNOSIS — E1159 Type 2 diabetes mellitus with other circulatory complications: Secondary | ICD-10-CM | POA: Diagnosis not present

## 2021-01-04 DIAGNOSIS — Z0000 Encounter for general adult medical examination without abnormal findings: Secondary | ICD-10-CM | POA: Diagnosis not present

## 2021-01-04 DIAGNOSIS — I2510 Atherosclerotic heart disease of native coronary artery without angina pectoris: Secondary | ICD-10-CM | POA: Diagnosis not present

## 2021-01-04 DIAGNOSIS — E1129 Type 2 diabetes mellitus with other diabetic kidney complication: Secondary | ICD-10-CM | POA: Diagnosis not present

## 2021-01-04 DIAGNOSIS — I739 Peripheral vascular disease, unspecified: Secondary | ICD-10-CM | POA: Diagnosis not present

## 2021-01-04 DIAGNOSIS — Z89421 Acquired absence of other right toe(s): Secondary | ICD-10-CM | POA: Diagnosis not present

## 2021-01-04 DIAGNOSIS — Z23 Encounter for immunization: Secondary | ICD-10-CM | POA: Diagnosis not present

## 2021-01-05 DIAGNOSIS — M545 Low back pain, unspecified: Secondary | ICD-10-CM | POA: Diagnosis not present

## 2021-01-20 DIAGNOSIS — Z981 Arthrodesis status: Secondary | ICD-10-CM | POA: Diagnosis not present

## 2021-01-20 DIAGNOSIS — I87322 Chronic venous hypertension (idiopathic) with inflammation of left lower extremity: Secondary | ICD-10-CM | POA: Diagnosis not present

## 2021-01-20 DIAGNOSIS — Z4889 Encounter for other specified surgical aftercare: Secondary | ICD-10-CM | POA: Diagnosis not present

## 2021-02-17 DIAGNOSIS — E113293 Type 2 diabetes mellitus with mild nonproliferative diabetic retinopathy without macular edema, bilateral: Secondary | ICD-10-CM | POA: Diagnosis not present

## 2021-02-17 DIAGNOSIS — H34812 Central retinal vein occlusion, left eye, with macular edema: Secondary | ICD-10-CM | POA: Diagnosis not present

## 2021-02-20 ENCOUNTER — Other Ambulatory Visit: Payer: Self-pay | Admitting: Internal Medicine

## 2021-02-21 ENCOUNTER — Ambulatory Visit: Payer: Medicare Other | Admitting: Internal Medicine

## 2021-04-16 DIAGNOSIS — U071 COVID-19: Secondary | ICD-10-CM | POA: Diagnosis not present

## 2021-05-02 ENCOUNTER — Other Ambulatory Visit: Payer: Self-pay

## 2021-05-02 ENCOUNTER — Other Ambulatory Visit (HOSPITAL_COMMUNITY): Payer: Self-pay | Admitting: Orthopedic Surgery

## 2021-05-02 ENCOUNTER — Ambulatory Visit (HOSPITAL_COMMUNITY)
Admission: RE | Admit: 2021-05-02 | Discharge: 2021-05-02 | Disposition: A | Discharge status: Home / Self Care | Payer: Medicare Other | Source: Ambulatory Visit | Attending: Cardiovascular Disease | Admitting: Cardiovascular Disease

## 2021-05-02 DIAGNOSIS — Z4889 Encounter for other specified surgical aftercare: Secondary | ICD-10-CM | POA: Diagnosis not present

## 2021-05-02 DIAGNOSIS — Z981 Arthrodesis status: Secondary | ICD-10-CM | POA: Diagnosis not present

## 2021-05-02 DIAGNOSIS — M79605 Pain in left leg: Secondary | ICD-10-CM | POA: Insufficient documentation

## 2021-05-02 DIAGNOSIS — M545 Low back pain, unspecified: Secondary | ICD-10-CM | POA: Diagnosis not present

## 2021-05-03 DIAGNOSIS — E1159 Type 2 diabetes mellitus with other circulatory complications: Secondary | ICD-10-CM | POA: Diagnosis not present

## 2021-05-03 DIAGNOSIS — Z125 Encounter for screening for malignant neoplasm of prostate: Secondary | ICD-10-CM | POA: Diagnosis not present

## 2021-05-03 DIAGNOSIS — H34812 Central retinal vein occlusion, left eye, with macular edema: Secondary | ICD-10-CM | POA: Diagnosis not present

## 2021-05-03 DIAGNOSIS — R590 Localized enlarged lymph nodes: Secondary | ICD-10-CM | POA: Diagnosis not present

## 2021-05-03 DIAGNOSIS — I739 Peripheral vascular disease, unspecified: Secondary | ICD-10-CM | POA: Diagnosis not present

## 2021-05-03 DIAGNOSIS — Z89421 Acquired absence of other right toe(s): Secondary | ICD-10-CM | POA: Diagnosis not present

## 2021-05-03 DIAGNOSIS — Z6831 Body mass index (BMI) 31.0-31.9, adult: Secondary | ICD-10-CM | POA: Diagnosis not present

## 2021-05-03 DIAGNOSIS — I2510 Atherosclerotic heart disease of native coronary artery without angina pectoris: Secondary | ICD-10-CM | POA: Diagnosis not present

## 2021-05-03 DIAGNOSIS — F17210 Nicotine dependence, cigarettes, uncomplicated: Secondary | ICD-10-CM | POA: Diagnosis not present

## 2021-05-03 DIAGNOSIS — N183 Chronic kidney disease, stage 3 unspecified: Secondary | ICD-10-CM | POA: Diagnosis not present

## 2021-05-04 ENCOUNTER — Other Ambulatory Visit: Payer: Self-pay | Admitting: Orthopedic Surgery

## 2021-05-04 DIAGNOSIS — M545 Low back pain, unspecified: Secondary | ICD-10-CM

## 2021-05-10 DIAGNOSIS — I739 Peripheral vascular disease, unspecified: Secondary | ICD-10-CM | POA: Diagnosis not present

## 2021-05-10 DIAGNOSIS — N189 Chronic kidney disease, unspecified: Secondary | ICD-10-CM | POA: Diagnosis not present

## 2021-05-10 DIAGNOSIS — I10 Essential (primary) hypertension: Secondary | ICD-10-CM | POA: Diagnosis not present

## 2021-05-10 DIAGNOSIS — E785 Hyperlipidemia, unspecified: Secondary | ICD-10-CM | POA: Diagnosis not present

## 2021-05-10 DIAGNOSIS — I208 Other forms of angina pectoris: Secondary | ICD-10-CM | POA: Diagnosis not present

## 2021-05-10 DIAGNOSIS — E119 Type 2 diabetes mellitus without complications: Secondary | ICD-10-CM | POA: Diagnosis not present

## 2021-05-18 ENCOUNTER — Other Ambulatory Visit: Payer: Self-pay

## 2021-05-18 ENCOUNTER — Ambulatory Visit
Admission: RE | Admit: 2021-05-18 | Discharge: 2021-05-18 | Disposition: A | Discharge status: Home / Self Care | Payer: Medicare Other | Source: Ambulatory Visit | Attending: Orthopedic Surgery | Admitting: Orthopedic Surgery

## 2021-05-18 DIAGNOSIS — M545 Low back pain, unspecified: Secondary | ICD-10-CM

## 2021-05-24 DIAGNOSIS — Z4889 Encounter for other specified surgical aftercare: Secondary | ICD-10-CM | POA: Diagnosis not present

## 2021-05-24 DIAGNOSIS — M5380 Other specified dorsopathies, site unspecified: Secondary | ICD-10-CM | POA: Diagnosis not present

## 2021-05-26 ENCOUNTER — Encounter: Payer: Self-pay | Admitting: Podiatry

## 2021-05-26 ENCOUNTER — Ambulatory Visit (INDEPENDENT_AMBULATORY_CARE_PROVIDER_SITE_OTHER): Payer: Medicare Other | Admitting: Podiatry

## 2021-05-26 ENCOUNTER — Other Ambulatory Visit: Payer: Self-pay

## 2021-05-26 DIAGNOSIS — E1142 Type 2 diabetes mellitus with diabetic polyneuropathy: Secondary | ICD-10-CM

## 2021-05-26 DIAGNOSIS — L97521 Non-pressure chronic ulcer of other part of left foot limited to breakdown of skin: Secondary | ICD-10-CM

## 2021-05-26 DIAGNOSIS — M79675 Pain in left toe(s): Secondary | ICD-10-CM

## 2021-05-26 DIAGNOSIS — M79674 Pain in right toe(s): Secondary | ICD-10-CM | POA: Diagnosis not present

## 2021-05-26 DIAGNOSIS — B351 Tinea unguium: Secondary | ICD-10-CM

## 2021-05-29 DIAGNOSIS — N2581 Secondary hyperparathyroidism of renal origin: Secondary | ICD-10-CM | POA: Diagnosis not present

## 2021-05-29 DIAGNOSIS — E1140 Type 2 diabetes mellitus with diabetic neuropathy, unspecified: Secondary | ICD-10-CM | POA: Diagnosis not present

## 2021-05-29 DIAGNOSIS — I2510 Atherosclerotic heart disease of native coronary artery without angina pectoris: Secondary | ICD-10-CM | POA: Diagnosis not present

## 2021-05-29 DIAGNOSIS — N189 Chronic kidney disease, unspecified: Secondary | ICD-10-CM | POA: Diagnosis not present

## 2021-05-29 DIAGNOSIS — E785 Hyperlipidemia, unspecified: Secondary | ICD-10-CM | POA: Diagnosis not present

## 2021-05-29 DIAGNOSIS — E1122 Type 2 diabetes mellitus with diabetic chronic kidney disease: Secondary | ICD-10-CM | POA: Diagnosis not present

## 2021-05-29 DIAGNOSIS — D631 Anemia in chronic kidney disease: Secondary | ICD-10-CM | POA: Diagnosis not present

## 2021-05-29 DIAGNOSIS — I129 Hypertensive chronic kidney disease with stage 1 through stage 4 chronic kidney disease, or unspecified chronic kidney disease: Secondary | ICD-10-CM | POA: Diagnosis not present

## 2021-05-29 DIAGNOSIS — N1832 Chronic kidney disease, stage 3b: Secondary | ICD-10-CM | POA: Diagnosis not present

## 2021-05-30 ENCOUNTER — Encounter: Payer: Self-pay | Admitting: Podiatry
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
## 2021-06-14 ENCOUNTER — Ambulatory Visit: Payer: Self-pay

## 2021-06-14 ENCOUNTER — Other Ambulatory Visit: Payer: Self-pay

## 2021-06-14 ENCOUNTER — Encounter: Payer: Self-pay | Admitting: Internal Medicine

## 2021-06-14 ENCOUNTER — Ambulatory Visit (INDEPENDENT_AMBULATORY_CARE_PROVIDER_SITE_OTHER): Payer: Medicare Other | Admitting: Internal Medicine

## 2021-06-14 VITALS — BP 150/89 | HR 84 | Ht 70.0 in | Wt 223.2 lb

## 2021-06-14 DIAGNOSIS — R768 Other specified abnormal immunological findings in serum: Secondary | ICD-10-CM | POA: Diagnosis not present

## 2021-06-14 DIAGNOSIS — N1832 Chronic kidney disease, stage 3b: Secondary | ICD-10-CM

## 2021-06-14 DIAGNOSIS — M25472 Effusion, left ankle: Secondary | ICD-10-CM | POA: Diagnosis not present

## 2021-06-14 DIAGNOSIS — M21942 Unspecified acquired deformity of hand, left hand: Secondary | ICD-10-CM | POA: Diagnosis not present

## 2021-06-14 DIAGNOSIS — M25475 Effusion, left foot: Secondary | ICD-10-CM

## 2021-06-14 DIAGNOSIS — E790 Hyperuricemia without signs of inflammatory arthritis and tophaceous disease: Secondary | ICD-10-CM

## 2021-06-14 DIAGNOSIS — R7689 Other specified abnormal immunological findings in serum: Secondary | ICD-10-CM | POA: Insufficient documentation

## 2021-06-14 DIAGNOSIS — M21941 Unspecified acquired deformity of hand, right hand: Secondary | ICD-10-CM | POA: Insufficient documentation

## 2021-06-14 DIAGNOSIS — M25471 Effusion, right ankle: Secondary | ICD-10-CM | POA: Diagnosis not present

## 2021-06-14 DIAGNOSIS — M25474 Effusion, right foot: Secondary | ICD-10-CM | POA: Diagnosis not present

## 2021-06-15 LAB — RNP ANTIBODY: Ribonucleic Protein(ENA) Antibody, IgG: 1 AI — AB

## 2021-06-15 LAB — ANTI-DNA ANTIBODY, DOUBLE-STRANDED: ds DNA Ab: 18 IU/mL — ABNORMAL HIGH

## 2021-06-15 LAB — SJOGRENS SYNDROME-A EXTRACTABLE NUCLEAR ANTIBODY: SSA (Ro) (ENA) Antibody, IgG: <1 AI

## 2021-06-15 LAB — ANTI-SMITH ANTIBODY: ENA SM Ab Ser-aCnc: <1 AI

## 2021-06-19 NOTE — Progress Notes (Signed)
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 Who is he seeing for nephrology? I'll need to ask from them if there is any evidence that might suggest lupus nephritis.

## 2021-06-30 DIAGNOSIS — H34812 Central retinal vein occlusion, left eye, with macular edema: Secondary | ICD-10-CM | POA: Diagnosis not present

## 2021-07-24 ENCOUNTER — Ambulatory Visit (INDEPENDENT_AMBULATORY_CARE_PROVIDER_SITE_OTHER): Payer: Medicare Other | Admitting: Internal Medicine

## 2021-07-24 ENCOUNTER — Encounter: Payer: Self-pay | Admitting: Internal Medicine

## 2021-07-24 ENCOUNTER — Other Ambulatory Visit: Payer: Self-pay

## 2021-07-24 VITALS — BP 122/98 | HR 81 | Ht 70.0 in | Wt 222.2 lb

## 2021-07-24 DIAGNOSIS — E1165 Type 2 diabetes mellitus with hyperglycemia: Secondary | ICD-10-CM

## 2021-07-24 DIAGNOSIS — E663 Overweight: Secondary | ICD-10-CM

## 2021-07-24 DIAGNOSIS — E782 Mixed hyperlipidemia: Secondary | ICD-10-CM | POA: Diagnosis not present

## 2021-07-24 DIAGNOSIS — E1159 Type 2 diabetes mellitus with other circulatory complications: Secondary | ICD-10-CM

## 2021-07-24 LAB — POCT GLYCOSYLATED HEMOGLOBIN (HGB A1C): Hemoglobin A1C: 7.2 % — AB (ref 4.0–5.6)

## 2021-07-24 MED ORDER — OZEMPIC (0.25 OR 0.5 MG/DOSE) 2 MG/1.5ML ~~LOC~~ SOPN: 0.5000 mg | PEN_INJECTOR | SUBCUTANEOUS | 3 refills | Status: AC

## 2021-07-24 MED ORDER — FREESTYLE LIBRE 2 SENSOR MISC: 1.0000 | 3 refills | Status: AC
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
## 2021-07-24 NOTE — Patient Instructions (Addendum)
Please continue: - Tresiba 30 units daily - Ozempic 0.5 mg weekly  Restart checking sugars.  Please return in 3-4 months.

## 2021-08-08 ENCOUNTER — Encounter (HOSPITAL_COMMUNITY): Payer: Self-pay | Admitting: *Deleted

## 2021-08-08 ENCOUNTER — Emergency Department (HOSPITAL_COMMUNITY)
Admission: EM | Admit: 2021-08-08 | Discharge: 2021-08-08 | Disposition: A | Discharge status: Home / Self Care | Payer: Medicare Other | Attending: Emergency Medicine | Admitting: Emergency Medicine

## 2021-08-08 ENCOUNTER — Other Ambulatory Visit: Payer: Self-pay

## 2021-08-08 ENCOUNTER — Emergency Department (HOSPITAL_COMMUNITY): Payer: Medicare Other

## 2021-08-08 DIAGNOSIS — N183 Chronic kidney disease, stage 3 unspecified: Secondary | ICD-10-CM | POA: Diagnosis not present

## 2021-08-08 DIAGNOSIS — Z7982 Long term (current) use of aspirin: Secondary | ICD-10-CM | POA: Diagnosis not present

## 2021-08-08 DIAGNOSIS — R059 Cough, unspecified: Secondary | ICD-10-CM | POA: Insufficient documentation

## 2021-08-08 DIAGNOSIS — I2510 Atherosclerotic heart disease of native coronary artery without angina pectoris: Secondary | ICD-10-CM | POA: Insufficient documentation

## 2021-08-08 DIAGNOSIS — Z20822 Contact with and (suspected) exposure to covid-19: Secondary | ICD-10-CM | POA: Diagnosis not present

## 2021-08-08 DIAGNOSIS — Z794 Long term (current) use of insulin: Secondary | ICD-10-CM | POA: Insufficient documentation

## 2021-08-08 DIAGNOSIS — N179 Acute kidney failure, unspecified: Secondary | ICD-10-CM | POA: Diagnosis not present

## 2021-08-08 DIAGNOSIS — R0789 Other chest pain: Secondary | ICD-10-CM

## 2021-08-08 DIAGNOSIS — Z720 Tobacco use: Secondary | ICD-10-CM

## 2021-08-08 DIAGNOSIS — R079 Chest pain, unspecified: Secondary | ICD-10-CM | POA: Diagnosis not present

## 2021-08-08 DIAGNOSIS — E1122 Type 2 diabetes mellitus with diabetic chronic kidney disease: Secondary | ICD-10-CM | POA: Insufficient documentation

## 2021-08-08 DIAGNOSIS — R062 Wheezing: Secondary | ICD-10-CM | POA: Insufficient documentation

## 2021-08-08 DIAGNOSIS — J069 Acute upper respiratory infection, unspecified: Secondary | ICD-10-CM | POA: Diagnosis not present

## 2021-08-08 DIAGNOSIS — Z79899 Other long term (current) drug therapy: Secondary | ICD-10-CM | POA: Diagnosis not present

## 2021-08-08 DIAGNOSIS — E875 Hyperkalemia: Secondary | ICD-10-CM | POA: Diagnosis not present

## 2021-08-08 DIAGNOSIS — F17210 Nicotine dependence, cigarettes, uncomplicated: Secondary | ICD-10-CM | POA: Insufficient documentation

## 2021-08-08 DIAGNOSIS — I1310 Hypertensive heart and chronic kidney disease without heart failure, with stage 1 through stage 4 chronic kidney disease, or unspecified chronic kidney disease: Secondary | ICD-10-CM | POA: Diagnosis not present

## 2021-08-08 LAB — BASIC METABOLIC PANEL
Anion gap: 4 — ABNORMAL LOW (ref 5–15)
BUN: 39 mg/dL — ABNORMAL HIGH (ref 6–20)
CO2: 23 mmol/L (ref 22–32)
Calcium: 8.2 mg/dL — ABNORMAL LOW (ref 8.9–10.3)
Chloride: 109 mmol/L (ref 98–111)
Creatinine, Ser: 3.81 mg/dL — ABNORMAL HIGH (ref 0.61–1.24)
GFR, Estimated: 19 mL/min — ABNORMAL LOW (ref >60)
Glucose, Bld: 229 mg/dL — ABNORMAL HIGH (ref 70–99)
Potassium: 5.5 mmol/L — ABNORMAL HIGH (ref 3.5–5.1)
Sodium: 136 mmol/L (ref 135–145)

## 2021-08-08 LAB — RESP PANEL BY RT-PCR (FLU A&B, COVID) ARPGX2
Influenza A by PCR: NEGATIVE
Influenza B by PCR: NEGATIVE
SARS Coronavirus 2 by RT PCR: NEGATIVE

## 2021-08-08 LAB — CBC
HCT: 34 % — ABNORMAL LOW (ref 39.0–52.0)
Hemoglobin: 11.1 g/dL — ABNORMAL LOW (ref 13.0–17.0)
MCH: 31.4 pg (ref 26.0–34.0)
MCHC: 32.6 g/dL (ref 30.0–36.0)
MCV: 96 fL (ref 80.0–100.0)
Platelets: 234 10*3/uL (ref 150–400)
RBC: 3.54 MIL/uL — ABNORMAL LOW (ref 4.22–5.81)
RDW: 13 % (ref 11.5–15.5)
WBC: 6.7 10*3/uL (ref 4.0–10.5)
nRBC: 0 % (ref 0.0–0.2)

## 2021-08-08 LAB — TROPONIN I (HIGH SENSITIVITY)
Troponin I (High Sensitivity): 8 ng/L (ref <18)
Troponin I (High Sensitivity): 8 ng/L (ref <18)

## 2021-08-08 MED ORDER — SODIUM ZIRCONIUM CYCLOSILICATE 5 G PO PACK
5.0000 g | PACK | Freq: Once | ORAL | Status: AC
  Administered 2021-08-08: 5 g via ORAL
  Filled 2021-08-08: qty 1

## 2021-08-08 MED ORDER — ALBUTEROL SULFATE HFA 108 (90 BASE) MCG/ACT IN AERS
2.0000 | INHALATION_SPRAY | Freq: Once | RESPIRATORY_TRACT | Status: AC
  Administered 2021-08-08: 2 via RESPIRATORY_TRACT
  Filled 2021-08-08: qty 6.7

## 2021-08-08 MED ORDER — AEROCHAMBER Z-STAT PLUS/MEDIUM MISC
1.0000 | Freq: Once | Status: AC
  Administered 2021-08-08: 1
  Filled 2021-08-08: qty 1

## 2021-08-08 MED ORDER — HYDROCODONE-ACETAMINOPHEN 5-325 MG PO TABS: 1.0000 | ORAL_TABLET | ORAL | 0 refills | Status: DC | PRN

## 2021-08-08 NOTE — ED Triage Notes (Signed)
C/o chest pain onset this am, has had a cough for the past several days.

## 2021-08-08 NOTE — Discharge Instructions (Addendum)
Only take Furosemide 20 mg daily.  Use compression hose for your leg swelling.  You need to get a repeat bmp (kidney function test) in 1 week.  Try to stop smoking.
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
## 2021-08-09 DIAGNOSIS — F17290 Nicotine dependence, other tobacco product, uncomplicated: Secondary | ICD-10-CM | POA: Diagnosis not present

## 2021-08-09 DIAGNOSIS — I10 Essential (primary) hypertension: Secondary | ICD-10-CM | POA: Diagnosis not present

## 2021-08-09 DIAGNOSIS — E785 Hyperlipidemia, unspecified: Secondary | ICD-10-CM | POA: Diagnosis not present

## 2021-08-09 DIAGNOSIS — I208 Other forms of angina pectoris: Secondary | ICD-10-CM | POA: Diagnosis not present

## 2021-08-11 ENCOUNTER — Other Ambulatory Visit: Payer: Self-pay | Admitting: Orthopedic Surgery

## 2021-08-11 DIAGNOSIS — Z4889 Encounter for other specified surgical aftercare: Secondary | ICD-10-CM

## 2021-08-21 ENCOUNTER — Ambulatory Visit
Admission: RE | Admit: 2021-08-21 | Discharge: 2021-08-21 | Disposition: A | Discharge status: Home / Self Care | Payer: Medicare Other | Source: Ambulatory Visit | Attending: Orthopedic Surgery | Admitting: Orthopedic Surgery

## 2021-08-21 DIAGNOSIS — Z4889 Encounter for other specified surgical aftercare: Secondary | ICD-10-CM

## 2021-08-21 DIAGNOSIS — M545 Low back pain, unspecified: Secondary | ICD-10-CM | POA: Diagnosis not present

## 2021-08-28 ENCOUNTER — Encounter: Payer: Self-pay | Admitting: Podiatry

## 2021-08-28 ENCOUNTER — Other Ambulatory Visit: Payer: Self-pay

## 2021-08-28 ENCOUNTER — Ambulatory Visit (INDEPENDENT_AMBULATORY_CARE_PROVIDER_SITE_OTHER): Payer: Medicare Other | Admitting: Podiatry

## 2021-08-28 DIAGNOSIS — E1142 Type 2 diabetes mellitus with diabetic polyneuropathy: Secondary | ICD-10-CM | POA: Diagnosis not present

## 2021-08-28 DIAGNOSIS — B351 Tinea unguium: Secondary | ICD-10-CM | POA: Diagnosis not present

## 2021-08-28 DIAGNOSIS — M79674 Pain in right toe(s): Secondary | ICD-10-CM | POA: Diagnosis not present

## 2021-08-28 DIAGNOSIS — M79675 Pain in left toe(s): Secondary | ICD-10-CM | POA: Diagnosis not present

## 2021-08-28 DIAGNOSIS — L84 Corns and callosities: Secondary | ICD-10-CM

## 2021-08-28 DIAGNOSIS — Z89431 Acquired absence of right foot: Secondary | ICD-10-CM

## 2021-08-29 ENCOUNTER — Other Ambulatory Visit: Payer: Medicare Other

## 2021-08-30 DIAGNOSIS — I213 ST elevation (STEMI) myocardial infarction of unspecified site: Secondary | ICD-10-CM | POA: Diagnosis not present

## 2021-08-30 DIAGNOSIS — N189 Chronic kidney disease, unspecified: Secondary | ICD-10-CM | POA: Diagnosis not present

## 2021-08-30 DIAGNOSIS — I739 Peripheral vascular disease, unspecified: Secondary | ICD-10-CM | POA: Diagnosis not present

## 2021-08-30 DIAGNOSIS — N2581 Secondary hyperparathyroidism of renal origin: Secondary | ICD-10-CM | POA: Diagnosis not present

## 2021-08-30 DIAGNOSIS — Z720 Tobacco use: Secondary | ICD-10-CM | POA: Diagnosis not present

## 2021-08-30 DIAGNOSIS — R768 Other specified abnormal immunological findings in serum: Secondary | ICD-10-CM | POA: Diagnosis not present

## 2021-08-30 DIAGNOSIS — E1122 Type 2 diabetes mellitus with diabetic chronic kidney disease: Secondary | ICD-10-CM | POA: Diagnosis not present

## 2021-08-30 DIAGNOSIS — D631 Anemia in chronic kidney disease: Secondary | ICD-10-CM | POA: Diagnosis not present

## 2021-08-30 DIAGNOSIS — N179 Acute kidney failure, unspecified: Secondary | ICD-10-CM | POA: Diagnosis not present

## 2021-08-30 DIAGNOSIS — I129 Hypertensive chronic kidney disease with stage 1 through stage 4 chronic kidney disease, or unspecified chronic kidney disease: Secondary | ICD-10-CM | POA: Diagnosis not present

## 2021-08-30 DIAGNOSIS — N1832 Chronic kidney disease, stage 3b: Secondary | ICD-10-CM | POA: Diagnosis not present

## 2021-08-31 NOTE — Progress Notes (Signed)
  Subjective:  Patient ID: Derrick Mosley, male    DOB: [birth year],  MRN: [id number]  Derrick Mosley presents to clinic today for at risk foot care. Patient has h/o amputation of 5th ray amputation right foot and thick, elongated toenails b/l feet which are tender when wearing enclosed shoe gear.  PCP is Lavella Lemons, [place] , and last visit was July, 2022.  No Known Allergies  Review of Systems: Negative except as noted in the HPI. Objective:   Constitutional DOC MANDALA is a pleasant 45 y.o. African American male, WD, WN in NAD. AAO x 3.   Vascular Capillary refill time to digits immediate b/l. Palpable DP pulse(s) left lower extremity Faintly palpable DP pulse(s) RLE. Faintly palpable PT pulse(s) b/l lower extremities. Lower extremity skin temperature gradient within normal limits. No cyanosis or clubbing noted.  Neurologic Normal speech. Oriented to person, place, and time. Protective sensation diminished with 10g monofilament b/l. Vibratory sensation diminished b/l.  Dermatologic Pedal integument with normal turgor, texture and tone b/l LE. No open wounds b/l. No interdigital macerations b/l. Toenails 1-5 left, R hallux, R 2nd toe, R 3rd toe, and R 4th toe elongated, thickened, discolored with subungual debris. +Tenderness with dorsal palpation of nailplates. Hyperkeratotic lesion plantar hallux iPJ left foot.  Orthopedic: Normal muscle strength 5/5 to all lower extremity muscle groups bilaterally. No pain crepitus or joint limitation noted with ROM b/l lower extremities. No gross bony deformities b/l lower extremities.   Radiographs: None Assessment:   1. Pain due to onychomycosis of toenails of both feet   2. Callus   3. 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 Diabetic peripheral neuropathy associated with type 2 diabetes mellitus ([place])    Plan:  Patient was evaluated and treated and all questions answered. Consent given for treatment as  described below: -Continue diabetic foot care principles: inspect feet daily, monitor glucose as recommended by PCP and/or Endocrinologist, and follow prescribed diet per PCP, Endocrinologist and/or dietician. -Toenails 1-5 left, R hallux, R 2nd toe, R 3rd toe, and R 4th toe debrided in length and girth without iatrogenic bleeding with sterile nail nipper and dremel.  -Callus(es) L hallux pared utilizing sterile scalpel blade without complication or incident. Total number debrided =1. -Patient/POA to call should there be question/concern in the interim.  Return in about 3 months (around 11/28/2021).  Marzetta Board, DPM

## 2021-09-01 DIAGNOSIS — H34812 Central retinal vein occlusion, left eye, with macular edema: Secondary | ICD-10-CM | POA: Diagnosis not present

## 2021-09-01 DIAGNOSIS — M5451 Vertebrogenic low back pain: Secondary | ICD-10-CM | POA: Diagnosis not present

## 2021-09-07 ENCOUNTER — Emergency Department (HOSPITAL_COMMUNITY): Payer: Medicare Other

## 2021-09-07 ENCOUNTER — Emergency Department (HOSPITAL_COMMUNITY)
Admission: EM | Admit: 2021-09-07 | Discharge: 2021-09-08 | Disposition: A | Discharge status: Home / Self Care | Payer: Medicare Other | Attending: Emergency Medicine | Admitting: Emergency Medicine

## 2021-09-07 ENCOUNTER — Encounter (HOSPITAL_COMMUNITY): Payer: Self-pay | Admitting: *Deleted

## 2021-09-07 ENCOUNTER — Other Ambulatory Visit: Payer: Self-pay

## 2021-09-07 DIAGNOSIS — Z79899 Other long term (current) drug therapy: Secondary | ICD-10-CM | POA: Diagnosis not present

## 2021-09-07 DIAGNOSIS — Z955 Presence of coronary angioplasty implant and graft: Secondary | ICD-10-CM | POA: Insufficient documentation

## 2021-09-07 DIAGNOSIS — M791 Myalgia, unspecified site: Secondary | ICD-10-CM | POA: Diagnosis not present

## 2021-09-07 DIAGNOSIS — E1140 Type 2 diabetes mellitus with diabetic neuropathy, unspecified: Secondary | ICD-10-CM | POA: Insufficient documentation

## 2021-09-07 DIAGNOSIS — Z794 Long term (current) use of insulin: Secondary | ICD-10-CM | POA: Insufficient documentation

## 2021-09-07 DIAGNOSIS — I2510 Atherosclerotic heart disease of native coronary artery without angina pectoris: Secondary | ICD-10-CM | POA: Diagnosis not present

## 2021-09-07 DIAGNOSIS — I10 Essential (primary) hypertension: Secondary | ICD-10-CM | POA: Diagnosis not present

## 2021-09-07 DIAGNOSIS — E1122 Type 2 diabetes mellitus with diabetic chronic kidney disease: Secondary | ICD-10-CM | POA: Diagnosis not present

## 2021-09-07 DIAGNOSIS — F17210 Nicotine dependence, cigarettes, uncomplicated: Secondary | ICD-10-CM | POA: Insufficient documentation

## 2021-09-07 DIAGNOSIS — Z7982 Long term (current) use of aspirin: Secondary | ICD-10-CM | POA: Diagnosis not present

## 2021-09-07 DIAGNOSIS — Z7984 Long term (current) use of oral hypoglycemic drugs: Secondary | ICD-10-CM | POA: Diagnosis not present

## 2021-09-07 DIAGNOSIS — I129 Hypertensive chronic kidney disease with stage 1 through stage 4 chronic kidney disease, or unspecified chronic kidney disease: Secondary | ICD-10-CM | POA: Diagnosis not present

## 2021-09-07 DIAGNOSIS — N183 Chronic kidney disease, stage 3 unspecified: Secondary | ICD-10-CM | POA: Diagnosis not present

## 2021-09-07 LAB — CBC
HCT: 30.4 % — ABNORMAL LOW (ref 39.0–52.0)
Hemoglobin: 10.1 g/dL — ABNORMAL LOW (ref 13.0–17.0)
MCH: 31.4 pg (ref 26.0–34.0)
MCHC: 33.2 g/dL (ref 30.0–36.0)
MCV: 94.4 fL (ref 80.0–100.0)
Platelets: 259 10*3/uL (ref 150–400)
RBC: 3.22 MIL/uL — ABNORMAL LOW (ref 4.22–5.81)
RDW: 13.2 % (ref 11.5–15.5)
WBC: 8.5 10*3/uL (ref 4.0–10.5)
nRBC: 0 % (ref 0.0–0.2)

## 2021-09-07 LAB — BASIC METABOLIC PANEL
Anion gap: 7 (ref 5–15)
BUN: 47 mg/dL — ABNORMAL HIGH (ref 6–20)
CO2: 20 mmol/L — ABNORMAL LOW (ref 22–32)
Calcium: 8 mg/dL — ABNORMAL LOW (ref 8.9–10.3)
Chloride: 112 mmol/L — ABNORMAL HIGH (ref 98–111)
Creatinine, Ser: 4.2 mg/dL — ABNORMAL HIGH (ref 0.61–1.24)
GFR, Estimated: 17 mL/min — ABNORMAL LOW (ref >60)
Glucose, Bld: 126 mg/dL — ABNORMAL HIGH (ref 70–99)
Potassium: 4.9 mmol/L (ref 3.5–5.1)
Sodium: 139 mmol/L (ref 135–145)

## 2021-09-07 LAB — CK: Total CK: 447 U/L — ABNORMAL HIGH (ref 49–397)

## 2021-09-07 LAB — TROPONIN I (HIGH SENSITIVITY): Troponin I (High Sensitivity): 7 ng/L (ref <18)
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
## 2021-09-07 NOTE — Discharge Instructions (Addendum)
Your lab tests tonight suggest that your muscle soreness is possibly secondary to a side effect from your cholesterol medicine (atorvastatin) as a blood test called a CK (creatinine kinase) is elevated which can suggest chronic muscle inflammation.  I have suggested holding this medication until you are reevaluated by your primary provider.  I would continue using your methocarbamol and plan to see Dr. Nelva Bush on Tuesday as you have scheduled for further management of your chronic back pain.  Your EKG and cardiac lab tests are stable and reassuring tonight.  You have been prescribed a small quantity of pain medicine.  Do not drive within 4 hours of taking hydrocodone as this will make you drowsy.

## 2021-09-07 NOTE — ED Triage Notes (Signed)
Pt c/o back pain and shoulder pain for the last month; pt states he has had a heart attack in the past

## 2021-09-08 LAB — TROPONIN I (HIGH SENSITIVITY): Troponin I (High Sensitivity): 8 ng/L (ref <18)

## 2021-09-08 MED ORDER — ACETAMINOPHEN 500 MG PO TABS
1000.0000 mg | ORAL_TABLET | Freq: Once | ORAL | Status: AC
  Administered 2021-09-08: 1000 mg via ORAL

## 2021-09-08 MED ORDER — HYDROCODONE-ACETAMINOPHEN 5-325 MG PO TABS: 1.0000 | ORAL_TABLET | Freq: Four times a day (QID) | ORAL | 0 refills | Status: DC | PRN

## 2021-09-11 ENCOUNTER — Encounter (HOSPITAL_COMMUNITY): Payer: Self-pay | Admitting: *Deleted

## 2021-09-11 ENCOUNTER — Emergency Department (HOSPITAL_COMMUNITY)
Admission: EM | Admit: 2021-09-11 | Discharge: 2021-09-11 | Disposition: A | Discharge status: Home / Self Care | Payer: Medicare Other | Attending: Emergency Medicine | Admitting: Emergency Medicine

## 2021-09-11 ENCOUNTER — Other Ambulatory Visit: Payer: Self-pay

## 2021-09-11 ENCOUNTER — Emergency Department (HOSPITAL_COMMUNITY): Payer: Medicare Other

## 2021-09-11 DIAGNOSIS — Z7982 Long term (current) use of aspirin: Secondary | ICD-10-CM | POA: Diagnosis not present

## 2021-09-11 DIAGNOSIS — Z79899 Other long term (current) drug therapy: Secondary | ICD-10-CM | POA: Diagnosis not present

## 2021-09-11 DIAGNOSIS — E1122 Type 2 diabetes mellitus with diabetic chronic kidney disease: Secondary | ICD-10-CM | POA: Insufficient documentation

## 2021-09-11 DIAGNOSIS — N183 Chronic kidney disease, stage 3 unspecified: Secondary | ICD-10-CM | POA: Insufficient documentation

## 2021-09-11 DIAGNOSIS — I10 Essential (primary) hypertension: Secondary | ICD-10-CM | POA: Diagnosis not present

## 2021-09-11 DIAGNOSIS — M479 Spondylosis, unspecified: Secondary | ICD-10-CM | POA: Diagnosis not present

## 2021-09-11 DIAGNOSIS — F17210 Nicotine dependence, cigarettes, uncomplicated: Secondary | ICD-10-CM | POA: Insufficient documentation

## 2021-09-11 DIAGNOSIS — M5412 Radiculopathy, cervical region: Secondary | ICD-10-CM | POA: Insufficient documentation

## 2021-09-11 DIAGNOSIS — Z794 Long term (current) use of insulin: Secondary | ICD-10-CM | POA: Diagnosis not present

## 2021-09-11 DIAGNOSIS — I2510 Atherosclerotic heart disease of native coronary artery without angina pectoris: Secondary | ICD-10-CM | POA: Diagnosis not present

## 2021-09-11 DIAGNOSIS — I129 Hypertensive chronic kidney disease with stage 1 through stage 4 chronic kidney disease, or unspecified chronic kidney disease: Secondary | ICD-10-CM | POA: Insufficient documentation

## 2021-09-11 DIAGNOSIS — M47819 Spondylosis without myelopathy or radiculopathy, site unspecified: Secondary | ICD-10-CM

## 2021-09-11 DIAGNOSIS — M542 Cervicalgia: Secondary | ICD-10-CM | POA: Diagnosis not present

## 2021-09-11 MED ORDER — DEXAMETHASONE SODIUM PHOSPHATE 10 MG/ML IJ SOLN
10.0000 mg | Freq: Once | INTRAMUSCULAR | Status: AC
  Administered 2021-09-11: 10 mg via INTRAMUSCULAR
  Filled 2021-09-11: qty 1

## 2021-09-11 MED ORDER — PREDNISONE 10 MG PO TABS: 50.0000 mg | ORAL_TABLET | Freq: Every day | ORAL | 0 refills | Status: AC

## 2021-09-11 MED ORDER — OXYCODONE-ACETAMINOPHEN 5-325 MG PO TABS
1.0000 | ORAL_TABLET | Freq: Once | ORAL | Status: AC
Start: 2021-09-11 — End: 2021-09-11
  Administered 2021-09-11: 1 via ORAL
  Filled 2021-09-11: qty 1

## 2021-09-11 NOTE — Discharge Instructions (Addendum)
You are seen in the ER for neck pain.  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  We suspect that your symptoms are likely because of it.  Please follow-up with the orthopedic spine doctor that you are seeing or the neurosurgeon team whose information is provided above.  We request that your pain management continue to be overlooked by the pain specialist that you are seeing.

## 2021-09-11 NOTE — ED Notes (Signed)
Returned from Ct scan  

## 2021-09-11 NOTE — ED Triage Notes (Signed)
Pt c/o neck, bilateral shoulder and arm pain x 5 days. Pt reports he was seen here recently and told it was probably due to his cholesterol medicine. He stopped taking it 4 days ago but the pain hasn't stopped.
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
## 2021-09-12 DIAGNOSIS — M961 Postlaminectomy syndrome, not elsewhere classified: Secondary | ICD-10-CM | POA: Insufficient documentation

## 2021-09-12 DIAGNOSIS — M5416 Radiculopathy, lumbar region: Secondary | ICD-10-CM | POA: Diagnosis not present

## 2021-09-20 DIAGNOSIS — M4802 Spinal stenosis, cervical region: Secondary | ICD-10-CM | POA: Diagnosis not present

## 2021-09-26 DIAGNOSIS — G959 Disease of spinal cord, unspecified: Secondary | ICD-10-CM | POA: Diagnosis not present

## 2021-10-01 DIAGNOSIS — G894 Chronic pain syndrome: Secondary | ICD-10-CM | POA: Diagnosis not present

## 2021-10-04 DIAGNOSIS — G959 Disease of spinal cord, unspecified: Secondary | ICD-10-CM | POA: Diagnosis not present

## 2021-10-04 DIAGNOSIS — M5021 Other cervical disc displacement,  high cervical region: Secondary | ICD-10-CM | POA: Diagnosis not present

## 2021-10-10 DIAGNOSIS — I10 Essential (primary) hypertension: Secondary | ICD-10-CM | POA: Diagnosis not present

## 2021-10-10 DIAGNOSIS — G894 Chronic pain syndrome: Secondary | ICD-10-CM | POA: Insufficient documentation

## 2021-10-10 DIAGNOSIS — G959 Disease of spinal cord, unspecified: Secondary | ICD-10-CM | POA: Diagnosis not present

## 2021-10-10 DIAGNOSIS — Z6833 Body mass index (BMI) 33.0-33.9, adult: Secondary | ICD-10-CM | POA: Diagnosis not present

## 2021-10-13 DIAGNOSIS — F17290 Nicotine dependence, other tobacco product, uncomplicated: Secondary | ICD-10-CM | POA: Diagnosis not present

## 2021-10-13 DIAGNOSIS — E785 Hyperlipidemia, unspecified: Secondary | ICD-10-CM | POA: Diagnosis not present

## 2021-10-13 DIAGNOSIS — I10 Essential (primary) hypertension: Secondary | ICD-10-CM | POA: Diagnosis not present

## 2021-10-13 DIAGNOSIS — N189 Chronic kidney disease, unspecified: Secondary | ICD-10-CM | POA: Diagnosis not present

## 2021-11-02 ENCOUNTER — Other Ambulatory Visit: Payer: Self-pay | Admitting: Neurosurgery

## 2021-11-08 DIAGNOSIS — I2510 Atherosclerotic heart disease of native coronary artery without angina pectoris: Secondary | ICD-10-CM | POA: Diagnosis not present

## 2021-11-08 DIAGNOSIS — I10 Essential (primary) hypertension: Secondary | ICD-10-CM | POA: Diagnosis not present

## 2021-11-08 DIAGNOSIS — E785 Hyperlipidemia, unspecified: Secondary | ICD-10-CM | POA: Diagnosis not present

## 2021-11-08 DIAGNOSIS — H34812 Central retinal vein occlusion, left eye, with macular edema: Secondary | ICD-10-CM | POA: Diagnosis not present

## 2021-11-14 NOTE — Progress Notes (Signed)
Surgical Instructions    Your procedure is scheduled on 11/20/21.  Report to [facility] Main Entrance "A" at 6:00 A.M., then check in with the Admitting office.  Call this number if you have problems the morning of surgery:  [phone]   If you have any questions prior to your surgery date call [phone]: Open Monday-Friday 8am-4pm    Remember:  Do not eat after midnight the night before your surgery  You may drink clear liquids until 5:00am the morning of your surgery.   Clear liquids allowed are: Water, Non-Citrus Juices (without pulp), Carbonated Beverages, Clear Tea, Black Coffee ONLY (NO MILK, CREAM OR POWDERED CREAMER of any kind), and Gatorade    Take these medicines the morning of surgery with A SIP OF WATER  amLODipine (NORVASC) isosorbide mononitrate (IMDUR)  metoprolol tartrate (LOPRESSOR)   IF NEEDED: HYDROcodone-acetaminophen (NORCO/VICODIN)  methocarbamol (ROBAXIN)  nitroGLYCERIN (NITROSTAT)- call and let us know if you need to use it  ondansetron [facility])  As of today, STOP taking any Aspirin (unless otherwise instructed by your surgeon) Aleve, Naproxen, Ibuprofen, Motrin, Advil, Goody's, BC's, all herbal medications, fish oil, and all vitamins.  Please follow your surgeons instructions on when to stop ticagrelor (BRILINTA). If no instructions were given to you, please call the office for further instructions.   WHAT DO I DO ABOUT MY DIABETES MEDICATION?   Do not take oral diabetes medicines (pills) the morning of surgery.      THE MORNING OF SURGERY, take 50% (15 units) of insulin degludec (TRESIBA FLEXTOUCH). Do not take Semaglutide.  The day of surgery, do not take other diabetes injectables, including Byetta (exenatide), Bydureon (exenatide ER), Victoza (liraglutide), or Trulicity (dulaglutide).  If your CBG is greater than 220 mg/dL, you may take  of your sliding scale (correction) dose of insulin.   HOW TO MANAGE YOUR DIABETES BEFORE AND AFTER  SURGERY  Why is it important to control my blood sugar before and after surgery? Improving blood sugar levels before and after surgery helps healing and can limit problems. A way of improving blood sugar control is eating a healthy diet by:  Eating less sugar and carbohydrates  Increasing activity/exercise  Talking with your doctor about reaching your blood sugar goals High blood sugars (greater than 180 mg/dL) can raise your risk of infections and slow your recovery, so you will need to focus on controlling your diabetes during the weeks before surgery. Make sure that the doctor who takes care of your diabetes knows about your planned surgery including the date and location.  How do I manage my blood sugar before surgery? Check your blood sugar at least 4 times a day, starting 2 days before surgery, to make sure that the level is not too high or low.  Check your blood sugar the morning of your surgery when you wake up and every 2 hours until you get to the Short Stay unit.  If your blood sugar is less than 70 mg/dL, you will need to treat for low blood sugar: Do not take insulin. Treat a low blood sugar (less than 70 mg/dL) with  cup of clear juice (cranberry or apple), 4 glucose tablets, OR glucose gel. Recheck blood sugar in 15 minutes after treatment (to make sure it is greater than 70 mg/dL). If your blood sugar is not greater than 70 mg/dL on recheck, call [phone] for further instructions. Report your blood sugar to the short stay nurse when you get to Short Stay.  If you are  admitted to the hospital after surgery: Your blood sugar will be checked by the staff and you will probably be given insulin after surgery (instead of oral diabetes medicines) to make sure you have good blood sugar levels. The goal for blood sugar control after surgery is 80-180 mg/dL.    After your COVID test   You are not required to quarantine however you are required to wear a well-fitting mask when  you are out and around people not in your household.  If your mask becomes wet or soiled, replace with a new one.  Wash your hands often with soap and water for 20 seconds or clean your hands with an alcohol-based hand sanitizer that contains at least 60% alcohol.  Do not share personal items.  Notify your provider: if you are in close contact with someone who has COVID  or if you develop a fever of 100.4 or greater, sneezing, cough, sore throat, shortness of breath or body aches.             Do not wear jewelry or makeup Do not wear lotions, powders, perfumes/colognes, or deodorant. Do not shave 48 hours prior to surgery.  Men may shave face and neck. Do not bring valuables to the hospital. DO Not wear nail polish, gel polish, artificial nails, or any other type of covering on natural nails including finger and toenails. If patients have artificial nails, gel coating, etc. that need to be removed by a nail salon, please have this removed prior to surgery or surgery may need to be canceled/delayed if the surgeon/ anesthesia feels like the patient is unable to be adequately monitored.             [place] is not responsible for any belongings or valuables.  Do NOT Smoke (Tobacco/Vaping)  24 hours prior to your procedure  If you use a CPAP at night, you may bring your mask for your overnight stay.   Contacts, glasses, hearing aids, dentures or partials may not be worn into surgery, please bring cases for these belongings   For patients admitted to the hospital, discharge time will be determined by your treatment team.   Patients discharged the day of surgery will not be allowed to drive home, and someone needs to stay with them for 24 hours.  NO VISITORS WILL BE ALLOWED IN PRE-OP WHERE PATIENTS ARE PREPPED FOR SURGERY.  ONLY 1 SUPPORT PERSON MAY BE PRESENT IN THE WAITING ROOM WHILE YOU ARE IN SURGERY.  IF YOU ARE TO BE ADMITTED, ONCE YOU ARE IN YOUR ROOM YOU WILL BE ALLOWED TWO (2)  VISITORS. 1 (ONE) VISITOR MAY STAY OVERNIGHT BUT MUST ARRIVE TO THE ROOM BY 8pm.  Minor children may have two parents present. Special consideration for safety and communication needs will be reviewed on a case by case basis.  Special instructions:    Oral Hygiene is also important to reduce your risk of infection.  Remember - BRUSH YOUR TEETH THE MORNING OF SURGERY WITH YOUR REGULAR TOOTHPASTE   [place]- Preparing For Surgery  Before surgery, you can play an important role. Because skin is not sterile, your skin needs to be as free of germs as possible. You can reduce the number of germs on your skin by washing with CHG (chlorahexidine gluconate) Soap before surgery.  CHG is an antiseptic cleaner which kills germs and bonds with the skin to continue killing germs even after washing.     Please do not use if you have  an allergy to CHG or antibacterial soaps. If your skin becomes reddened/irritated stop using the CHG.  Do not shave (including legs and underarms) for at least 48 hours prior to first CHG shower. It is OK to shave your face.  Please follow these instructions carefully.     Shower the NIGHT BEFORE SURGERY and the MORNING OF SURGERY with CHG Soap.   If you chose to wash your hair, wash your hair first as usual with your normal shampoo. After you shampoo, rinse your hair and body thoroughly to remove the shampoo.  Then [employer] and genitals (private parts) with your normal soap and rinse thoroughly to remove soap.  After that Use CHG Soap as you would any other liquid soap. You can apply CHG directly to the skin and wash gently with a scrungie or a clean washcloth.   Apply the CHG Soap to your body ONLY FROM THE NECK DOWN.  Do not use on open wounds or open sores. Avoid contact with your eyes, ears, mouth and genitals (private parts). Wash Face and genitals (private parts)  with your normal soap.   Wash thoroughly, paying special attention to the area where your surgery will  be performed.  Thoroughly rinse your body with warm water from the neck down.  DO NOT shower/wash with your normal soap after using and rinsing off the CHG Soap.  Pat yourself dry with a CLEAN TOWEL.  Wear CLEAN PAJAMAS to bed the night before surgery  Place CLEAN SHEETS on your bed the night before your surgery  DO NOT SLEEP WITH PETS.   Day of Surgery: Take a shower with CHG soap. Wear Clean/Comfortable clothing the morning of surgery Do not apply any deodorants/lotions.   Remember to brush your teeth WITH YOUR REGULAR TOOTHPASTE.   Please read over the following fact sheets that you were given.

## 2021-11-15 ENCOUNTER — Encounter (HOSPITAL_COMMUNITY): Payer: Self-pay

## 2021-11-15 ENCOUNTER — Other Ambulatory Visit: Payer: Self-pay

## 2021-11-15 ENCOUNTER — Encounter (HOSPITAL_COMMUNITY)
Admission: RE | Admit: 2021-11-15 | Discharge: 2021-11-15 | Disposition: A | Discharge status: Home / Self Care | Payer: Medicare Other | Source: Ambulatory Visit | Attending: Neurosurgery | Admitting: Neurosurgery

## 2021-11-15 VITALS — BP 107/71 | HR 94 | Temp 98.6°F | Resp 18 | Ht 71.0 in | Wt 227.1 lb

## 2021-11-15 DIAGNOSIS — I252 Old myocardial infarction: Secondary | ICD-10-CM | POA: Insufficient documentation

## 2021-11-15 DIAGNOSIS — E1122 Type 2 diabetes mellitus with diabetic chronic kidney disease: Secondary | ICD-10-CM | POA: Diagnosis not present

## 2021-11-15 DIAGNOSIS — I2510 Atherosclerotic heart disease of native coronary artery without angina pectoris: Secondary | ICD-10-CM | POA: Diagnosis not present

## 2021-11-15 DIAGNOSIS — Z01818 Encounter for other preprocedural examination: Secondary | ICD-10-CM

## 2021-11-15 DIAGNOSIS — D649 Anemia, unspecified: Secondary | ICD-10-CM | POA: Diagnosis not present

## 2021-11-15 DIAGNOSIS — N184 Chronic kidney disease, stage 4 (severe): Secondary | ICD-10-CM | POA: Diagnosis not present

## 2021-11-15 DIAGNOSIS — I10 Essential (primary) hypertension: Secondary | ICD-10-CM

## 2021-11-15 DIAGNOSIS — E1159 Type 2 diabetes mellitus with other circulatory complications: Secondary | ICD-10-CM

## 2021-11-15 DIAGNOSIS — Z955 Presence of coronary angioplasty implant and graft: Secondary | ICD-10-CM | POA: Diagnosis not present

## 2021-11-15 DIAGNOSIS — Z01812 Encounter for preprocedural laboratory examination: Secondary | ICD-10-CM | POA: Diagnosis not present

## 2021-11-15 LAB — CBC
HCT: 31.3 % — ABNORMAL LOW (ref 39.0–52.0)
Hemoglobin: 10.2 g/dL — ABNORMAL LOW (ref 13.0–17.0)
MCH: 30.4 pg (ref 26.0–34.0)
MCHC: 32.6 g/dL (ref 30.0–36.0)
MCV: 93.4 fL (ref 80.0–100.0)
Platelets: 293 10*3/uL (ref 150–400)
RBC: 3.35 MIL/uL — ABNORMAL LOW (ref 4.22–5.81)
RDW: 13 % (ref 11.5–15.5)
WBC: 8.2 10*3/uL (ref 4.0–10.5)
nRBC: 0 % (ref 0.0–0.2)

## 2021-11-15 LAB — TYPE AND SCREEN
ABO/RH(D): B POS
Antibody Screen: NEGATIVE

## 2021-11-15 LAB — SURGICAL PCR SCREEN
MRSA, PCR: NEGATIVE
Staphylococcus aureus: NEGATIVE

## 2021-11-15 LAB — BASIC METABOLIC PANEL
Anion gap: 16 — ABNORMAL HIGH (ref 5–15)
BUN: 53 mg/dL — ABNORMAL HIGH (ref 6–20)
CO2: 22 mmol/L (ref 22–32)
Calcium: 8.5 mg/dL — ABNORMAL LOW (ref 8.9–10.3)
Chloride: 104 mmol/L (ref 98–111)
Creatinine, Ser: 4.42 mg/dL — ABNORMAL HIGH (ref 0.61–1.24)
GFR, Estimated: 16 mL/min — ABNORMAL LOW (ref >60)
Glucose, Bld: 177 mg/dL — ABNORMAL HIGH (ref 70–99)
Potassium: 4.8 mmol/L (ref 3.5–5.1)
Sodium: 142 mmol/L (ref 135–145)

## 2021-11-15 LAB — HEMOGLOBIN A1C
Hgb A1c MFr Bld: 8.3 % — ABNORMAL HIGH (ref 4.8–5.6)
Mean Plasma Glucose: 191.51 mg/dL

## 2021-11-15 LAB — GLUCOSE, CAPILLARY: Glucose-Capillary: 210 mg/dL — ABNORMAL HIGH (ref 70–99)

## 2021-11-15 NOTE — Progress Notes (Signed)
PCP - Aggie Hacker, PA Cardiologist - Dr. Charolette Forward  PPM/ICD - denies   Chest x-ray - 09/07/21 EKG - 09/07/21 Stress Test - denies ECHO - 01/29/18 Cardiac Cath - 01/26/18  Sleep Study - denies  DM- Type 2 Fasting Blood Sugar - 90-120 Checks Blood Sugar every other day  ASA/Blood Thinner Instructions: pt has held ASA and Brilinta since 1/15 per order from Dr. Terrence Dupont   ERAS Protcol - yes, no drink   COVID TEST- pt will be tested on DOS   Anesthesia review: yes, pending cardiac clearance Dr. Terrence Dupont would like lab results to be faxed to [phone]  Patient denies shortness of breath, fever, cough and chest pain at PAT appointment   All instructions explained to the patient, with a verbal understanding of the material. Patient agrees to go over the instructions while at home for a better understanding. The opportunity to ask questions was provided.

## 2021-11-17 NOTE — Progress Notes (Signed)
Anesthesia Chart Review:  Follows with cardiologist Dr. Terrence Dupont for history of CAD s/p anterior STEMI treated with DES to proximal circumflex 01/26/2018.  Echo at that time showed EF 45 to 50%.  Follow-up echo 03/25/2020 showed EF 50%, mild MR.  Clearance from Dr. Terrence Dupont dated 11/16/2021 states, "patient is cleared for surgery from a cardiac point of view.  Okay to stop aspirin and Brilinta 7 days prior to surgery."  Patient reports last dose aspirin and Brilinta 11/12/2021.  History of CKD 4 followed by Dr. Marval Regal.  Preop labs showed creatinine 4.42 which is up a bit from baseline based on review of available records.  I forwarded these labs to Dr. Marval Regal and followed up with his assistant.  She advised Dr. Marval Regal did review the labs and cleared the patient for surgery.  Clearance was faxed to surgeon's office, which I have requested.  IDDM 2, not well controlled, A1c 8.3 on preop labs.  Preop labs also notable for mild anemia with hemoglobin 10.2 (appears near baseline).  EKG 09/07/2021: Normal sinus rhythm.  Rate 98. Inferior infarct , age undetermined  Echo 03/25/20 (Dr. Terrence Dupont): Summary: Normal left ventricular size.  Left ventricular systolic function is mildly depressed with abnormal ejection fraction estimated at 50%. Left ventricular perjury fee is seen with normal diastolic function noted. Mild mitral regurgitation. Trace aortic regurgitation. Trace pulmonic regurgitation. Trace tricuspid regurgitation. No intracardiac shunting is found. No intracardiac masses or thrombi are seen. No pericardial effusion is seen with no suggestion of pericardial tamponade. (Comparison 01/29/18: LVEF 45-50%, moderate hypokinesis of the basal inferior myocardium)   Cardiac cath 01/26/18: Prox LAD lesion is 50% stenosed. Ost 1st Diag lesion is 40% stenosed. Ost 2nd Diag to 2nd Diag lesion is 30% stenosed. Prox RCA to Mid RCA lesion is 30% stenosed. Prox Cx lesion is 100% stenosed. A  drug-eluting stent was successfully placed using a STENT SIERRA 2.75 X 28 MM. Post intervention, there is a 0% residual stenosis. 1st Mrg lesion is 100% stenosed.    Wynonia Musty [facility] Short Stay Center/Anesthesiology Phone [phone] 11/17/2021 4:21 PM

## 2021-11-17 NOTE — Anesthesia Preprocedure Evaluation (Addendum)
Anesthesia Evaluation  Patient identified by MRN, date of birth, ID band Patient awake    Reviewed: Allergy & Precautions, NPO status , Patient's Chart, lab work & pertinent test results  Airway Mallampati: II  TM Distance: >3 FB     Dental   Pulmonary pneumonia, Current Smoker,    breath sounds clear to auscultation       Cardiovascular hypertension, + CAD and + Past MI   Rhythm:Regular Rate:Normal     Neuro/Psych  Headaches,  Neuromuscular disease    GI/Hepatic negative GI ROS, Neg liver ROS,   Endo/Other  diabetes  Renal/GU Renal disease     Musculoskeletal   Abdominal   Peds  Hematology   Anesthesia Other Findings   Reproductive/Obstetrics                           Anesthesia Physical Anesthesia Plan  ASA: 3  Anesthesia Plan: General   Post-op Pain Management:    Induction:   PONV Risk Score and Plan: 2 and Ondansetron, Dexamethasone and Midazolam  Airway Management Planned: Oral ETT  Additional Equipment:   Intra-op Plan:   Post-operative Plan: Possible Post-op intubation/ventilation  Informed Consent: I have reviewed the patients History and Physical, chart, labs and discussed the procedure including the risks, benefits and alternatives for the proposed anesthesia with the patient or authorized representative who has indicated his/her understanding and acceptance.     Dental advisory given  Plan Discussed with: Anesthesiologist and CRNA  Anesthesia Plan Comments: (PAT note by Karoline Caldwell, PA-C: Follows with cardiologist Dr. Terrence Dupont for history of CAD s/p anterior STEMI treated with DES to proximal circumflex 01/26/2018.  Echo at that time showed EF 45 to 50%.  Follow-up echo 03/25/2020 showed EF 50%, mild MR.  Clearance from Dr. Terrence Dupont dated 11/16/2021 states, "patient is cleared for surgery from a cardiac point of view.  Okay to stop aspirin and Brilinta 7 days prior to  surgery."  Patient reports last dose aspirin and Brilinta 11/12/2021.  History of CKD 4 followed by Dr. Marval Regal.  Preop labs showed creatinine 4.42 which is up a bit from baseline based on review of available records.  I forwarded these labs to Dr. Marval Regal and followed up with his assistant.  She advised Dr. Marval Regal did review the labs and cleared the patient for surgery.  Clearance was faxed to surgeon's office, which I have requested.  IDDM 2, not well controlled, A1c 8.3 on preop labs.  Preop labs also notable for mild anemia with hemoglobin 10.2 (appears near baseline).  EKG 09/07/2021: Normal sinus rhythm.  Rate 98. Inferior infarct , age undetermined  Echo 03/25/20 (Dr. Terrence Dupont): Summary: Normal left ventricular size. Left ventricular systolic function is mildly depressed with abnormal ejection fraction estimated at 50%. Left ventricular perjury fee is seen with normal diastolic function noted. Mild mitral regurgitation. Trace aortic regurgitation. Trace pulmonic regurgitation. Trace tricuspid regurgitation. No intracardiac shunting is found. No intracardiac masses or thrombi are seen. No pericardial effusion is seen with no suggestion of pericardial tamponade. (Comparison 01/29/18: LVEF 45-50%, moderate hypokinesis of the basal inferior myocardium)  Cardiac cath 01/26/18:  Prox LAD lesion is 50% stenosed.  Ost 1st Diag lesion is 40% stenosed.  Ost 2nd Diag to 2nd Diag lesion is 30% stenosed.  Prox RCA to Mid RCA lesion is 30% stenosed.  Prox Cx lesion is 100% stenosed.  A drug-eluting stent was successfully placed using a STENT SIERRA 2.75 X 28 MM.  Post intervention, there is a 0% residual stenosis.  1st Mrg lesion is 100% stenosed.  )      Anesthesia Quick Evaluation

## 2021-11-20 ENCOUNTER — Observation Stay (HOSPITAL_COMMUNITY)
Admission: RE | Admit: 2021-11-20 | Discharge: 2021-11-21 | Disposition: A | Discharge status: Home / Self Care | Payer: Medicare Other | Attending: Neurosurgery | Admitting: Neurosurgery

## 2021-11-20 ENCOUNTER — Ambulatory Visit (HOSPITAL_COMMUNITY): Payer: Medicare Other | Admitting: Anesthesiology

## 2021-11-20 ENCOUNTER — Ambulatory Visit (HOSPITAL_COMMUNITY): Payer: Medicare Other

## 2021-11-20 ENCOUNTER — Ambulatory Visit (HOSPITAL_COMMUNITY): Payer: Medicare Other | Admitting: Physician Assistant

## 2021-11-20 ENCOUNTER — Other Ambulatory Visit: Payer: Self-pay

## 2021-11-20 ENCOUNTER — Encounter (HOSPITAL_COMMUNITY): Payer: Self-pay | Admitting: Neurosurgery

## 2021-11-20 ENCOUNTER — Encounter (HOSPITAL_COMMUNITY)
Admission: RE | Disposition: A | Discharge status: Home / Self Care | Payer: Self-pay | Source: Home / Self Care | Attending: Neurosurgery

## 2021-11-20 DIAGNOSIS — I2510 Atherosclerotic heart disease of native coronary artery without angina pectoris: Secondary | ICD-10-CM | POA: Diagnosis not present

## 2021-11-20 DIAGNOSIS — N183 Chronic kidney disease, stage 3 unspecified: Secondary | ICD-10-CM | POA: Diagnosis not present

## 2021-11-20 DIAGNOSIS — M4712 Other spondylosis with myelopathy, cervical region: Secondary | ICD-10-CM | POA: Diagnosis not present

## 2021-11-20 DIAGNOSIS — E119 Type 2 diabetes mellitus without complications: Secondary | ICD-10-CM | POA: Diagnosis not present

## 2021-11-20 DIAGNOSIS — F17210 Nicotine dependence, cigarettes, uncomplicated: Secondary | ICD-10-CM | POA: Diagnosis not present

## 2021-11-20 DIAGNOSIS — Z79899 Other long term (current) drug therapy: Secondary | ICD-10-CM | POA: Diagnosis not present

## 2021-11-20 DIAGNOSIS — M50021 Cervical disc disorder at C4-C5 level with myelopathy: Principal | ICD-10-CM | POA: Insufficient documentation

## 2021-11-20 DIAGNOSIS — M50022 Cervical disc disorder at C5-C6 level with myelopathy: Secondary | ICD-10-CM | POA: Diagnosis not present

## 2021-11-20 DIAGNOSIS — Z419 Encounter for procedure for purposes other than remedying health state, unspecified: Secondary | ICD-10-CM

## 2021-11-20 DIAGNOSIS — Z981 Arthrodesis status: Secondary | ICD-10-CM | POA: Diagnosis not present

## 2021-11-20 DIAGNOSIS — Z8673 Personal history of transient ischemic attack (TIA), and cerebral infarction without residual deficits: Secondary | ICD-10-CM | POA: Insufficient documentation

## 2021-11-20 DIAGNOSIS — I252 Old myocardial infarction: Secondary | ICD-10-CM | POA: Diagnosis not present

## 2021-11-20 DIAGNOSIS — M4802 Spinal stenosis, cervical region: Secondary | ICD-10-CM | POA: Diagnosis not present

## 2021-11-20 DIAGNOSIS — M4322 Fusion of spine, cervical region: Secondary | ICD-10-CM | POA: Diagnosis not present

## 2021-11-20 DIAGNOSIS — Z20822 Contact with and (suspected) exposure to covid-19: Secondary | ICD-10-CM | POA: Insufficient documentation

## 2021-11-20 DIAGNOSIS — Z794 Long term (current) use of insulin: Secondary | ICD-10-CM | POA: Diagnosis not present

## 2021-11-20 DIAGNOSIS — I10 Essential (primary) hypertension: Secondary | ICD-10-CM | POA: Diagnosis not present

## 2021-11-20 DIAGNOSIS — G959 Disease of spinal cord, unspecified: Secondary | ICD-10-CM | POA: Diagnosis present

## 2021-11-20 DIAGNOSIS — I129 Hypertensive chronic kidney disease with stage 1 through stage 4 chronic kidney disease, or unspecified chronic kidney disease: Secondary | ICD-10-CM | POA: Diagnosis not present

## 2021-11-20 HISTORY — PX: ANTERIOR CERVICAL DECOMP/DISCECTOMY FUSION: SHX1161

## 2021-11-20 LAB — SARS CORONAVIRUS 2 BY RT PCR (HOSPITAL ORDER, PERFORMED IN ~~LOC~~ HOSPITAL LAB): SARS Coronavirus 2: NEGATIVE

## 2021-11-20 LAB — GLUCOSE, CAPILLARY
Glucose-Capillary: 146 mg/dL — ABNORMAL HIGH (ref 70–99)
Glucose-Capillary: 135 mg/dL — ABNORMAL HIGH (ref 70–99)
Glucose-Capillary: 120 mg/dL — ABNORMAL HIGH (ref 70–99)
Glucose-Capillary: 178 mg/dL — ABNORMAL HIGH (ref 70–99)
Glucose-Capillary: 248 mg/dL — ABNORMAL HIGH (ref 70–99)
Glucose-Capillary: 375 mg/dL — ABNORMAL HIGH (ref 70–99)

## 2021-11-20 SURGERY — ANTERIOR CERVICAL DECOMPRESSION/DISCECTOMY FUSION 2 LEVELS
Anesthesia: General | Site: Neck

## 2021-11-20 MED ORDER — 0.9 % SODIUM CHLORIDE (POUR BTL) OPTIME
TOPICAL | Status: DC | PRN
  Administered 2021-11-20: 1000 mL

## 2021-11-20 MED ORDER — METOPROLOL TARTRATE 25 MG PO TABS
50.0000 mg | ORAL_TABLET | Freq: Two times a day (BID) | ORAL | Status: DC
  Administered 2021-11-20 – 2021-11-21 (×2): 50 mg via ORAL
  Filled 2021-11-20 (×2): qty 2

## 2021-11-20 MED ORDER — AMLODIPINE BESYLATE 5 MG PO TABS
5.0000 mg | ORAL_TABLET | Freq: Every day | ORAL | Status: DC
  Administered 2021-11-21: 10:00:00 5 mg via ORAL
  Filled 2021-11-20: qty 1

## 2021-11-20 MED ORDER — SUCCINYLCHOLINE CHLORIDE 200 MG/10ML IV SOSY
PREFILLED_SYRINGE | INTRAVENOUS | Status: DC | PRN
Start: 2021-11-20 — End: 2021-11-20
  Administered 2021-11-20: 140 mg via INTRAVENOUS

## 2021-11-20 MED ORDER — MENTHOL 3 MG MT LOZG: 1.0000 | LOZENGE | OROMUCOSAL | Status: DC | PRN

## 2021-11-20 MED ORDER — CHLORHEXIDINE GLUCONATE CLOTH 2 % EX PADS: 6.0000 | MEDICATED_PAD | Freq: Once | CUTANEOUS | Status: DC

## 2021-11-20 MED ORDER — CEFAZOLIN SODIUM-DEXTROSE 1-4 GM/50ML-% IV SOLN
1.0000 g | Freq: Three times a day (TID) | INTRAVENOUS | Status: AC
  Administered 2021-11-20 (×2): 1 g via INTRAVENOUS
  Filled 2021-11-20 (×2): qty 50

## 2021-11-20 MED ORDER — PROPOFOL 10 MG/ML IV BOLUS
INTRAVENOUS | Status: AC
  Filled 2021-11-20: qty 20

## 2021-11-20 MED ORDER — ATORVASTATIN CALCIUM 40 MG PO TABS
40.0000 mg | ORAL_TABLET | Freq: Every day | ORAL | Status: DC
  Administered 2021-11-20: 40 mg via ORAL
  Filled 2021-11-20: qty 1

## 2021-11-20 MED ORDER — TORSEMIDE 20 MG PO TABS
20.0000 mg | ORAL_TABLET | Freq: Two times a day (BID) | ORAL | Status: DC
  Administered 2021-11-20 – 2021-11-21 (×2): 20 mg via ORAL
  Filled 2021-11-20 (×3): qty 1

## 2021-11-20 MED ORDER — FENTANYL CITRATE (PF) 250 MCG/5ML IJ SOLN
INTRAMUSCULAR | Status: AC
  Filled 2021-11-20: qty 5

## 2021-11-20 MED ORDER — THROMBIN 5000 UNITS EX SOLR
CUTANEOUS | Status: DC | PRN
  Administered 2021-11-20 (×2): 5000 [IU] via TOPICAL

## 2021-11-20 MED ORDER — ORAL CARE MOUTH RINSE: 15.0000 mL | Freq: Once | OROMUCOSAL | Status: AC

## 2021-11-20 MED ORDER — ONDANSETRON HCL 4 MG PO TABS: 4.0000 mg | ORAL_TABLET | Freq: Four times a day (QID) | ORAL | Status: DC | PRN

## 2021-11-20 MED ORDER — DEXAMETHASONE SODIUM PHOSPHATE 10 MG/ML IJ SOLN
INTRAMUSCULAR | Status: AC
  Filled 2021-11-20: qty 1

## 2021-11-20 MED ORDER — HYDROMORPHONE HCL 1 MG/ML IJ SOLN: 1.0000 mg | INTRAMUSCULAR | Status: DC | PRN

## 2021-11-20 MED ORDER — INSULIN ASPART 100 UNIT/ML IJ SOLN
0.0000 [IU] | Freq: Every day | INTRAMUSCULAR | Status: DC
  Administered 2021-11-20: 5 [IU] via SUBCUTANEOUS

## 2021-11-20 MED ORDER — FENTANYL CITRATE (PF) 100 MCG/2ML IJ SOLN: 25.0000 ug | INTRAMUSCULAR | Status: DC | PRN

## 2021-11-20 MED ORDER — ONDANSETRON HCL 4 MG/2ML IJ SOLN
INTRAMUSCULAR | Status: DC | PRN
  Administered 2021-11-20: 4 mg via INTRAVENOUS

## 2021-11-20 MED ORDER — ACETAMINOPHEN 325 MG PO TABS: 650.0000 mg | ORAL_TABLET | ORAL | Status: DC | PRN

## 2021-11-20 MED ORDER — LACTATED RINGERS IV SOLN: INTRAVENOUS | Status: DC

## 2021-11-20 MED ORDER — MIDAZOLAM HCL 2 MG/2ML IJ SOLN
INTRAMUSCULAR | Status: DC | PRN
Start: 2021-11-20 — End: 2021-11-20
  Administered 2021-11-20: 2 mg via INTRAVENOUS

## 2021-11-20 MED ORDER — INSULIN DEGLUDEC 100 UNIT/ML ~~LOC~~ SOPN: 30.0000 [IU] | PEN_INJECTOR | Freq: Every day | SUBCUTANEOUS | Status: DC

## 2021-11-20 MED ORDER — ROCURONIUM BROMIDE 10 MG/ML (PF) SYRINGE
PREFILLED_SYRINGE | INTRAVENOUS | Status: AC
  Filled 2021-11-20: qty 10

## 2021-11-20 MED ORDER — FENTANYL CITRATE (PF) 100 MCG/2ML IJ SOLN
INTRAMUSCULAR | Status: DC | PRN
  Administered 2021-11-20: 100 ug via INTRAVENOUS

## 2021-11-20 MED ORDER — HYDROMORPHONE HCL 1 MG/ML IJ SOLN
INTRAMUSCULAR | Status: AC
  Filled 2021-11-20: qty 1

## 2021-11-20 MED ORDER — PROPOFOL 10 MG/ML IV BOLUS
INTRAVENOUS | Status: DC | PRN
  Administered 2021-11-20: 130 mg via INTRAVENOUS

## 2021-11-20 MED ORDER — ACETAMINOPHEN 650 MG RE SUPP: 650.0000 mg | RECTAL | Status: DC | PRN

## 2021-11-20 MED ORDER — METOPROLOL TARTRATE 25 MG PO TABS: 25.0000 mg | ORAL_TABLET | Freq: Two times a day (BID) | ORAL | Status: DC

## 2021-11-20 MED ORDER — LIDOCAINE 2% (20 MG/ML) 5 ML SYRINGE
INTRAMUSCULAR | Status: DC | PRN
Start: 2021-11-20 — End: 2021-11-20
  Administered 2021-11-20: 100 mg via INTRAVENOUS

## 2021-11-20 MED ORDER — ISOSORBIDE MONONITRATE ER 60 MG PO TB24
60.0000 mg | ORAL_TABLET | Freq: Every morning | ORAL | Status: DC
  Administered 2021-11-21: 10:00:00 60 mg via ORAL
  Filled 2021-11-20: qty 1

## 2021-11-20 MED ORDER — GABAPENTIN 300 MG PO CAPS
300.0000 mg | ORAL_CAPSULE | Freq: Every day | ORAL | Status: DC
  Administered 2021-11-20: 300 mg via ORAL
  Filled 2021-11-20: qty 1

## 2021-11-20 MED ORDER — SODIUM CHLORIDE 0.9% FLUSH: 3.0000 mL | INTRAVENOUS | Status: DC | PRN

## 2021-11-20 MED ORDER — ROCURONIUM BROMIDE 10 MG/ML (PF) SYRINGE
PREFILLED_SYRINGE | INTRAVENOUS | Status: DC | PRN
  Administered 2021-11-20: 50 mg via INTRAVENOUS

## 2021-11-20 MED ORDER — MECLIZINE HCL 25 MG PO TABS: 25.0000 mg | ORAL_TABLET | Freq: Three times a day (TID) | ORAL | Status: DC | PRN

## 2021-11-20 MED ORDER — SODIUM CHLORIDE 0.9% FLUSH
3.0000 mL | Freq: Two times a day (BID) | INTRAVENOUS | Status: DC
  Administered 2021-11-20: 3 mL via INTRAVENOUS

## 2021-11-20 MED ORDER — HYDROMORPHONE HCL 1 MG/ML IJ SOLN
0.2500 mg | INTRAMUSCULAR | Status: DC | PRN
  Administered 2021-11-20 (×2): 0.5 mg via INTRAVENOUS

## 2021-11-20 MED ORDER — CYCLOBENZAPRINE HCL 10 MG PO TABS
10.0000 mg | ORAL_TABLET | Freq: Three times a day (TID) | ORAL | Status: DC | PRN
  Administered 2021-11-20 (×2): 10 mg via ORAL
  Filled 2021-11-20 (×2): qty 1

## 2021-11-20 MED ORDER — INSULIN GLARGINE-YFGN 100 UNIT/ML ~~LOC~~ SOLN
30.0000 [IU] | Freq: Every day | SUBCUTANEOUS | Status: DC
  Administered 2021-11-21: 10:00:00 30 [IU] via SUBCUTANEOUS
  Filled 2021-11-20: qty 0.3

## 2021-11-20 MED ORDER — PHENYLEPHRINE 40 MCG/ML (10ML) SYRINGE FOR IV PUSH (FOR BLOOD PRESSURE SUPPORT)
PREFILLED_SYRINGE | INTRAVENOUS | Status: DC | PRN
  Administered 2021-11-20: 160 ug via INTRAVENOUS

## 2021-11-20 MED ORDER — ONDANSETRON HCL 4 MG/2ML IJ SOLN
INTRAMUSCULAR | Status: AC
  Filled 2021-11-20: qty 2

## 2021-11-20 MED ORDER — ONDANSETRON HCL 4 MG/2ML IJ SOLN: 4.0000 mg | Freq: Four times a day (QID) | INTRAMUSCULAR | Status: DC | PRN

## 2021-11-20 MED ORDER — PHENYLEPHRINE HCL-NACL 20-0.9 MG/250ML-% IV SOLN
INTRAVENOUS | Status: DC | PRN
  Administered 2021-11-20: 25 ug/min via INTRAVENOUS

## 2021-11-20 MED ORDER — HYDROCODONE-ACETAMINOPHEN 5-325 MG PO TABS
1.0000 | ORAL_TABLET | ORAL | Status: DC | PRN
  Administered 2021-11-21: 03:00:00 1 via ORAL
  Filled 2021-11-20: qty 1

## 2021-11-20 MED ORDER — SODIUM CHLORIDE 0.9 % IV SOLN: INTRAVENOUS | Status: DC | PRN

## 2021-11-20 MED ORDER — MIDAZOLAM HCL 2 MG/2ML IJ SOLN
INTRAMUSCULAR | Status: AC
  Filled 2021-11-20: qty 2

## 2021-11-20 MED ORDER — NITROGLYCERIN 0.4 MG SL SUBL: 0.4000 mg | SUBLINGUAL_TABLET | SUBLINGUAL | Status: DC | PRN

## 2021-11-20 MED ORDER — PHENOL 1.4 % MT LIQD
1.0000 | OROMUCOSAL | Status: DC | PRN
  Administered 2021-11-20: 1 via OROMUCOSAL
  Filled 2021-11-20: qty 177

## 2021-11-20 MED ORDER — DEXAMETHASONE SODIUM PHOSPHATE 10 MG/ML IJ SOLN
INTRAMUSCULAR | Status: DC | PRN
  Administered 2021-11-20: 8 mg via INTRAVENOUS

## 2021-11-20 MED ORDER — CHLORHEXIDINE GLUCONATE 0.12 % MT SOLN
15.0000 mL | Freq: Once | OROMUCOSAL | Status: AC
  Administered 2021-11-20: 15 mL via OROMUCOSAL
  Filled 2021-11-20: qty 15

## 2021-11-20 MED ORDER — VITAMIN D 25 MCG (1000 UNIT) PO TABS
1000.0000 [IU] | ORAL_TABLET | Freq: Every day | ORAL | Status: DC
  Administered 2021-11-20 – 2021-11-21 (×2): 1000 [IU] via ORAL
  Filled 2021-11-20 (×2): qty 1

## 2021-11-20 MED ORDER — HEMOSTATIC AGENTS (NO CHARGE) OPTIME
TOPICAL | Status: DC | PRN
  Administered 2021-11-20: 1 via TOPICAL

## 2021-11-20 MED ORDER — INSULIN ASPART 100 UNIT/ML IJ SOLN
0.0000 [IU] | Freq: Three times a day (TID) | INTRAMUSCULAR | Status: DC
  Administered 2021-11-20: 7 [IU] via SUBCUTANEOUS
  Administered 2021-11-21: 07:00:00 15 [IU] via SUBCUTANEOUS

## 2021-11-20 MED ORDER — SEMAGLUTIDE(0.25 OR 0.5MG/DOS) 2 MG/1.5ML ~~LOC~~ SOPN: 0.5000 mg | PEN_INJECTOR | SUBCUTANEOUS | Status: DC

## 2021-11-20 MED ORDER — SODIUM CHLORIDE 0.9 % IV SOLN
250.0000 mL | INTRAVENOUS | Status: DC
  Administered 2021-11-20: 250 mL via INTRAVENOUS

## 2021-11-20 MED ORDER — CEFAZOLIN SODIUM-DEXTROSE 2-4 GM/100ML-% IV SOLN
2.0000 g | INTRAVENOUS | Status: AC
  Administered 2021-11-20: 2 g via INTRAVENOUS
  Filled 2021-11-20: qty 100

## 2021-11-20 MED ORDER — HYDROCODONE-ACETAMINOPHEN 10-325 MG PO TABS
1.0000 | ORAL_TABLET | ORAL | Status: DC | PRN
  Administered 2021-11-20 – 2021-11-21 (×4): 2 via ORAL
  Filled 2021-11-20 (×4): qty 2

## 2021-11-20 MED ORDER — SUCCINYLCHOLINE CHLORIDE 200 MG/10ML IV SOSY
PREFILLED_SYRINGE | INTRAVENOUS | Status: AC
  Filled 2021-11-20: qty 10

## 2021-11-20 MED ORDER — THROMBIN 5000 UNITS EX SOLR
CUTANEOUS | Status: AC
  Filled 2021-11-20: qty 15000

## 2021-11-20 MED ORDER — SUGAMMADEX SODIUM 200 MG/2ML IV SOLN
INTRAVENOUS | Status: DC | PRN
Start: 2021-11-20 — End: 2021-11-20
  Administered 2021-11-20: 200 mg via INTRAVENOUS

## 2021-11-20 SURGICAL SUPPLY — 58 items
BAG COUNTER SPONGE SURGICOUNT (BAG) ×2 IMPLANT
BAG DECANTER FOR FLEXI CONT (MISCELLANEOUS) ×1 IMPLANT
BAG SURGICOUNT SPONGE COUNTING (BAG) ×1
BAND RUBBER #18 3X1/16 STRL (MISCELLANEOUS) ×6 IMPLANT
BENZOIN TINCTURE PRP APPL 2/3 (GAUZE/BANDAGES/DRESSINGS) ×3 IMPLANT
BIT DRILL 13 (BIT) ×1 IMPLANT
BIT DRILL 13MM (BIT) ×1
BUR MATCHSTICK NEURO 3.0 LAGG (BURR) ×3 IMPLANT
CAGE PEEK 7X14X11 (Cage) ×6 IMPLANT
CANISTER SUCT 3000ML PPV (MISCELLANEOUS) ×3 IMPLANT
CARTRIDGE OIL MAESTRO DRILL (MISCELLANEOUS) ×1 IMPLANT
CLOSURE WOUND 1/2 X4 (GAUZE/BANDAGES/DRESSINGS) ×1
DIFFUSER DRILL AIR PNEUMATIC (MISCELLANEOUS) ×3 IMPLANT
DRAPE C-ARM 42X72 X-RAY (DRAPES) ×6 IMPLANT
DRAPE LAPAROTOMY 100X72 PEDS (DRAPES) ×3 IMPLANT
DRAPE MICROSCOPE LEICA (MISCELLANEOUS) ×3 IMPLANT
DURAPREP 6ML APPLICATOR 50/CS (WOUND CARE) ×3 IMPLANT
ELECT COATED BLADE 2.86 ST (ELECTRODE) ×3 IMPLANT
ELECT REM PT RETURN 9FT ADLT (ELECTROSURGICAL) ×3
ELECTRODE REM PT RTRN 9FT ADLT (ELECTROSURGICAL) ×1 IMPLANT
GAUZE 4X4 16PLY ~~LOC~~+RFID DBL (SPONGE) ×2 IMPLANT
GAUZE SPONGE 4X4 12PLY STRL (GAUZE/BANDAGES/DRESSINGS) ×3 IMPLANT
GAUZE SPONGE 4X4 12PLY STRL LF (GAUZE/BANDAGES/DRESSINGS) ×2 IMPLANT
GLOVE EXAM NITRILE XL STR (GLOVE) IMPLANT
GLOVE SURG LTX SZ9 (GLOVE) ×3 IMPLANT
GOWN STRL REUS W/ TWL LRG LVL3 (GOWN DISPOSABLE) IMPLANT
GOWN STRL REUS W/ TWL XL LVL3 (GOWN DISPOSABLE) ×1 IMPLANT
GOWN STRL REUS W/TWL 2XL LVL3 (GOWN DISPOSABLE) IMPLANT
GOWN STRL REUS W/TWL LRG LVL3 (GOWN DISPOSABLE) ×9
GOWN STRL REUS W/TWL XL LVL3 (GOWN DISPOSABLE) ×3
HALTER HD/CHIN CERV TRACTION D (MISCELLANEOUS) ×3 IMPLANT
HEMOSTAT POWDER KIT SURGIFOAM (HEMOSTASIS) IMPLANT
HEMOSTAT SURGICEL 2X14 (HEMOSTASIS) IMPLANT
KIT BASIN OR (CUSTOM PROCEDURE TRAY) ×3 IMPLANT
KIT TURNOVER KIT B (KITS) ×3 IMPLANT
NDL SPNL 20GX3.5 QUINCKE YW (NEEDLE) ×1 IMPLANT
NEEDLE SPNL 20GX3.5 QUINCKE YW (NEEDLE) ×3 IMPLANT
NS IRRIG 1000ML POUR BTL (IV SOLUTION) ×3 IMPLANT
OIL CARTRIDGE MAESTRO DRILL (MISCELLANEOUS) ×3
PACK LAMINECTOMY NEURO (CUSTOM PROCEDURE TRAY) ×3 IMPLANT
PAD ARMBOARD 7.5X6 YLW CONV (MISCELLANEOUS) ×9 IMPLANT
PLATE 45MM (Plate) ×3 IMPLANT
PLATE 45XATL VS ELT (Plate) IMPLANT
SCREW ST 13X4XST VA NS SPNE (Screw) IMPLANT
SCREW ST VAR 4 ATL (Screw) ×18 IMPLANT
SPACER SPNL 11X14X7XPEEK CVD (Cage) IMPLANT
SPCR SPNL 11X14X7XPEEK CVD (Cage) ×2 IMPLANT
SPONGE INTESTINAL PEANUT (DISPOSABLE) ×3 IMPLANT
SPONGE SURGIFOAM ABS GEL SZ50 (HEMOSTASIS) ×3 IMPLANT
STRIP CLOSURE SKIN 1/2X4 (GAUZE/BANDAGES/DRESSINGS) ×2 IMPLANT
SUT VIC AB 3-0 SH 8-18 (SUTURE) ×3 IMPLANT
SUT VIC AB 4-0 RB1 18 (SUTURE) ×3 IMPLANT
TAPE CLOTH 4X10 WHT NS (GAUZE/BANDAGES/DRESSINGS) ×1 IMPLANT
TAPE CLOTH SURG 4X10 WHT LF (GAUZE/BANDAGES/DRESSINGS) ×2 IMPLANT
TOWEL GREEN STERILE (TOWEL DISPOSABLE) ×3 IMPLANT
TOWEL GREEN STERILE FF (TOWEL DISPOSABLE) ×3 IMPLANT
TRAP SPECIMEN MUCUS 40CC (MISCELLANEOUS) ×3 IMPLANT
WATER STERILE IRR 1000ML POUR (IV SOLUTION) ×3 IMPLANT

## 2021-11-20 NOTE — Progress Notes (Signed)
Orthopedic Tech Progress Note Patient Details:  POWELL HALBERT Jan 09, 1976 [id number]  OR called PACU requesting a SOFT COLLAR be ready when patient comes over. So I dropped off a COLLAR to PACU   Ortho Devices Type of Ortho Device: Soft collar Ortho Device/Splint Location: NECK Ortho Device/Splint Interventions: Ordered   Post Interventions Patient Tolerated: Well Instructions Provided: Care of device  Janit Pagan 11/20/2021, 9:49 AM

## 2021-11-20 NOTE — Anesthesia Procedure Notes (Signed)
Procedure Name: Intubation Date/Time: 11/20/2021 8:14 AM Performed by: Barrington Ellison, CRNA Pre-anesthesia Checklist: Patient identified, Emergency Drugs available, Suction available and Patient being monitored Patient Re-evaluated:Patient Re-evaluated prior to induction Oxygen Delivery Method: Circle System Utilized Preoxygenation: Pre-oxygenation with 100% oxygen Induction Type: IV induction Ventilation: Mask ventilation without difficulty Laryngoscope Size: Glidescope and 4 Grade View: Grade I Tube type: Oral Tube size: 7.5 mm Number of attempts: 1 Airway Equipment and Method: Stylet and Oral airway Placement Confirmation: ETT inserted through vocal cords under direct vision, positive ETCO2 and breath sounds checked- equal and bilateral Secured at: 22 cm Tube secured with: Tape Dental Injury: Teeth and Oropharynx as per pre-operative assessment  Difficulty Due To: Difficulty was anticipated and Difficult Airway- due to reduced neck mobility Comments: Elective glidescope d/t patient's etiology
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
## 2021-11-20 NOTE — Anesthesia Postprocedure Evaluation (Signed)
Anesthesia Post Note  Patient: [name]  Procedure(s) Performed: Cervical four-five, Cervical five-six Anterior Cervical Decompression Discectomy Fusion (Neck)     Patient location during evaluation: PACU Anesthesia Type: General Level of consciousness: awake Pain management: pain level controlled Vital Signs Assessment: post-procedure vital signs reviewed and stable Respiratory status: spontaneous breathing Cardiovascular status: stable Postop Assessment: no apparent nausea or vomiting Anesthetic complications: no   No notable events documented.  Last Vitals:  Vitals:   11/20/21 1050 11/20/21 1103  BP: (!) 146/93 (!) 145/93  Pulse: 87 86  Resp: 15 18  Temp: 36.6 C 36.4 C  SpO2: 97% 96%    Last Pain:  Vitals:   11/20/21 1103  TempSrc: Oral  PainSc:                  Nariya Neumeyer

## 2021-11-20 NOTE — Transfer of Care (Signed)
Immediate Anesthesia Transfer of Care Note  Patient: Derrick Mosley  Procedure(s) Performed: Cervical four-five, Cervical five-six Anterior Cervical Decompression Discectomy Fusion (Neck)  Patient Location: PACU  Anesthesia Type:General  Level of Consciousness: drowsy and patient cooperative  Airway & Oxygen Therapy: Patient Spontanous Breathing and Patient connected to face mask oxygen  Post-op Assessment: Report given to RN  Post vital signs: Reviewed and stable  Last Vitals:  Vitals Value Taken Time  BP 126/93 11/20/21 1003  Temp    Pulse 84 11/20/21 1004  Resp 17 11/20/21 1004  SpO2 99 % 11/20/21 1004  Vitals shown include unvalidated device data.  Last Pain:  Vitals:   11/20/21 0641  TempSrc:   PainSc: 8       Patients Stated Pain Goal: 3 (49/75/30 0511)  Complications: No notable events documented.

## 2021-11-20 NOTE — TOC Progression Note (Signed)
Transition of Care [facility]) - Progression Note    Patient Details  Name: Derrick Mosley MRN: [id number] Date of Birth: [birth year]  Transition of Care [facility]) CM/SW Contact  Sinaya Minogue, Edson Snowball, RN Phone Number: 11/20/2021, 12:28 PM  Clinical Narrative:     Thayer Headings with [facility] has home health protocol from DR Pool's office.   3c staff will provide any DME needed.  Transition of Care [facility]) Screening Note   Patient Details  Name: Derrick Mosley Date of Birth: 02/22/1976    Transition of Care Department [facility]) has reviewed patient and no TOC needs have been identified at this time. We will continue to monitor patient advancement through interdisciplinary progression rounds. If new patient transition needs arise, please place a TOC consult.         Expected Discharge Plan and Services                                                 Social Determinants of Health (SDOH) Interventions    Readmission Risk Interventions Readmission Risk Prevention Plan 03/16/2019  Transportation Screening Complete  PCP or Specialist Appt within 3-5 Days Complete  HRI or [place] Complete  Social Work Consult for [place] Planning/Counseling Complete  Palliative Care Screening Not Applicable  Medication Review [employer]) Complete  Some recent data might be hidden

## 2021-11-20 NOTE — Evaluation (Addendum)
Occupational Therapy Evaluation Patient Details Name: Derrick Mosley MRN: [id number] DOB: 1976-09-19 Today's Date: 11/20/2021   History of Present Illness This 46 yo male s/p C4-5, C5-6 anterior cervical discectomy with interbody fusion. PHMx: TLIF L4-5 2021. peripheral neuropathy in legs and feet (knees down)   Clinical Impression   This 46 yo male admitted and underwent above presents to acute OT with PLOF of needing occasional A for B/D and with decreased balance sometimes using a SPC. He currently has a soft collar on and has cervical precautions which make it more difficult for basic ADLs. He will continue to benefit from acute OT without need for follow up.      Recommendations for follow up therapy are one component of a multi-disciplinary discharge planning process, led by the attending physician.  Recommendations may be updated based on patient status, additional functional criteria and insurance authorization.   Follow Up Recommendations  No OT follow up    Assistance Recommended at Discharge Intermittent Supervision/Assistance  Patient can return home with the following A little help with walking and/or transfers;A little help with bathing/dressing/bathroom;Assistance with cooking/housework;Assist for transportation    Functional Status Assessment  Patient has had a recent decline in their functional status and demonstrates the ability to make significant improvements in function in a reasonable and predictable amount of time.  Equipment Recommendations  BSC/3in1       Precautions / Restrictions Precautions Precautions: Fall;Cervical Precaution Booklet Issued: Yes (comment) Restrictions Weight Bearing Restrictions: No      Mobility Bed Mobility Overal bed mobility: Needs Assistance Bed Mobility: Rolling, Supine to Sit, Sit to Sidelying Rolling: Supervision   Supine to sit: Supervision, HOB elevated   Sit to sidelying: Supervision      Transfers Overall  transfer level: Needs assistance Equipment used: Rolling walker (2 wheels) Transfers: Sit to/from Stand Sit to Stand: Min guard           General transfer comment: VCS for hand placement      Balance Overall balance assessment: Needs assistance Sitting-balance support: No upper extremity supported, Feet supported Sitting balance-Leahy Scale: Good     Standing balance support: No upper extremity supported, During functional activity Standing balance-Leahy Scale: Fair Standing balance comment: standing to was hands at sink; however for ambulation he does much better with a RW                           ADL either performed or assessed with clinical judgement   ADL Overall ADL's : Needs assistance/impaired Eating/Feeding: Independent;Sitting Eating/Feeding Details (indicate cue type and reason): Discussed alwasy using cups with straws Grooming: Wash/dry hands;Min guard;Standing Grooming Details (indicate cue type and reason): Discussed use of two cups for brushing teeth to avoid bending over sink Upper Body Bathing: Set up;Sitting   Lower Body Bathing: Moderate assistance Lower Body Bathing Details (indicate cue type and reason): min A sit<>stand Upper Body Dressing : Set up;Sitting   Lower Body Dressing: Moderate assistance Lower Body Dressing Details (indicate cue type and reason): min A sit<>stand; pt cannot cross his legs to get to his feet Toilet Transfer: Minimal assistance;Ambulation Toilet Transfer Details (indicate cue type and reason): pushing IV pole, standing to urinate Toileting- Clothing Manipulation and Hygiene: Min guard Toileting - Clothing Manipulation Details (indicate cue type and reason): standing        Educated on use of 3n1 in shower stall as shower seat with it facing out towards door so he  can just back up to shower stall threshold, step over, and sit down)     Vision Patient Visual Report: No change from baseline               Pertinent Vitals/Pain Pain Assessment Pain Assessment: Faces Faces Pain Scale: Hurts a little bit Pain Location: neck Pain Descriptors / Indicators: Aching, Sore Pain Intervention(s): Limited activity within patient's tolerance, Monitored during session     Hand Dominance Right   Extremity/Trunk Assessment Upper Extremity Assessment Upper Extremity Assessment: Overall WFL for tasks assessed           Communication Communication Communication: No difficulties   Cognition Arousal/Alertness: Awake/alert Behavior During Therapy: WFL for tasks assessed/performed Overall Cognitive Status: Within Functional Limits for tasks assessed                                                  Home Living Family/patient expects to be discharged to:: Private residence Living Arrangements: Spouse/significant other Available Help at Discharge: Family;Available PRN/intermittently Type of Home: House Home Access: Stairs to enter [employer] of Steps: 3 Entrance Stairs-Rails: Right;Left;Can reach both Home Layout: One level     Bathroom Shower/Tub: Walk-in shower;Door   [employer] Toilet: Standard     Home Equipment: [employer] - single point   Additional Comments: On disability      Prior Functioning/Environment Prior Level of Function : Independent/Modified Independent                        OT Problem List: Decreased strength;Impaired balance (sitting and/or standing);Pain      OT Treatment/Interventions: Self-care/ADL training;Balance training;DME and/or AE instruction;Patient/family education    OT Goals(Current goals can be found in the care plan section) Acute Rehab OT Goals Patient Stated Goal: to go home tomorrow OT Goal Formulation: With patient/family Time For Goal Achievement: 12/04/21 Potential to Achieve Goals: Good  OT Frequency: Min 2X/week       AM-PAC OT "6 Clicks" Daily Activity     Outcome Measure Help from another  person eating meals?: None Help from another person taking care of personal grooming?: A Little Help from another person toileting, which includes using toliet, bedpan, or urinal?: A Little Help from another person bathing (including washing, rinsing, drying)?: A Lot Help from another person to put on and taking off regular upper body clothing?: A Little Help from another person to put on and taking off regular lower body clothing?: A Lot 6 Click Score: 17   End of Session Equipment Utilized During Treatment: Gait belt;Rolling walker (2 wheels) Nurse Communication:  (NT: pt urinated)  Activity Tolerance: Patient tolerated treatment well Patient left: in bed;with call bell/phone within reach;with family/visitor present  OT Visit Diagnosis: Unsteadiness on feet (R26.81);Other abnormalities of gait and mobility (R26.89);Pain Pain - part of body:  (neck)                Time: 7408-1448 OT Time Calculation (min): 40 min Charges:  OT General Charges $OT Visit: 1 Visit OT Evaluation $OT Eval Moderate Complexity: 1 Mod OT Treatments $Self Care/Home Management : 23-37 mins  Golden Circle, OTR/L Acute [employer] Pager [phone] Office [phone]    Almon Register 11/20/2021, 6:14 PM

## 2021-11-20 NOTE — Op Note (Signed)
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  Asst. Surgeon: Venetia Constable, MD; Reinaldo Meeker, NP  Anesthesia: General  Indication: 46 year old male with neck and bilateral extremity pain numbness and weakness.  Work-up demonstrates evidence of a very large central disc herniation with severe cord compression at C4-5.  Patient also with spondylosis and stenosis and cord compression at C5-6.  Patient presents now for two-level anterior cervical discectomy and fusion in hopes improving his symptoms.  Operative note: After induction anesthesia, patient positioned supine with neck slightly extended held in place with halter traction.  Patient's anterior cervical region prepped draped sterilely.  Incision made overlying C5.  Dissection performed on the right.  Retractor placed.  Fluoroscopy used.  Levels confirmed.  The spaces at C5-6 and C4-5 incised with 15 blade.  Discectomy was then performed using various instruments down to level the posterior annulus.  Microscope is brought into the field for dissection of the spinal canal.  Remaining aspects of osteophytes and annulus were removed using high-speed drill down to level of the posterior logical limb.  Posterior logical is not elevated and resected piecemeal fashion.  Underlying thecal sac was identified.  A wide central decompression was then performed undercutting the bodies of C4 and C5.  Decompression proceeded external foramina.  Wide anterior foraminotomies were performed along course the exiting C5 nerve roots bilaterally.  At C4-5 there was a 3 disc herniation was dissected free and also removed.  Procedures then repeated at C5-6 again  without complications.  Wound was then irrigated with antibiotic solution.  Gelfoam was placed topically for hemostasis then removed.  7 mm [employer] anatomic peek cages were then packed with locally harvested autograft and packed into both levels.  Each cage was recessed slightly from the anterior cortical margin.  45 mm Atlantis anterior cervical plate was then placed over the C4, C5 and C6 levels.  This then attached under fluoroscopic guidance using 13 mm variable angle screws to each at all 3 levels.  All screws given a final tightening found to be solidly in the bone.  Locking screws gauged all levels.  Final images reveal good position of cages and the hardware at the proper upper level with normal alignment of spine.  Wound was then irrigated 1 final time.  Hemostasis was assured with bipolar cautery.  Wounds then closed in layers with Vicryl sutures.  Steri-Strips and sterile dressing were applied.  No apparent complications.  Patient tolerated the procedure well and he returns to the recovery room postop.

## 2021-11-20 NOTE — Anesthesia Postprocedure Evaluation (Deleted)
Anesthesia Post Note  Patient: Derrick Mosley  Procedure(s) Performed: Cervical four-five, Cervical five-six Anterior Cervical Decompression Discectomy Fusion (Neck)     Anesthesia Post Evaluation No notable events documented.  Last Vitals:  Vitals:   11/20/21 1050 11/20/21 1103  BP: (!) 146/93 (!) 145/93  Pulse: 87 86  Resp: 15 18  Temp: 36.6 C 36.4 C  SpO2: 97% 96%    Last Pain:  Vitals:   11/20/21 1103  TempSrc: Oral  PainSc:                  Adamari Frede

## 2021-11-20 NOTE — Brief Op Note (Signed)
11/20/2021  9:52 AM  PATIENT:  Derrick Mosley  46 y.o. male  PRE-OPERATIVE DIAGNOSIS:  Disease of spinal cord  POST-OPERATIVE DIAGNOSIS:  Disease of spinal cord  PROCEDURE:  Procedure(s): Cervical four-five, Cervical five-six Anterior Cervical Decompression Discectomy Fusion (N/A)  SURGEON:  Surgeon(s) and Role:    * Earnie Larsson, MD - Primary    * Ostergard, Joyice Faster, MD - Assisting  PHYSICIAN ASSISTANT:   ASSISTANTSMearl Latin   ANESTHESIA:   general  EBL:  50 cc   BLOOD ADMINISTERED:none  DRAINS: none   LOCAL MEDICATIONS USED:  NONE  SPECIMEN:  No Specimen  DISPOSITION OF SPECIMEN:  N/A  COUNTS:  YES  TOURNIQUET:  * No tourniquets in log *  DICTATION: .Dragon Dictation  PLAN OF CARE: Admit for overnight observation  PATIENT DISPOSITION:  PACU - hemodynamically stable.   Delay start of Pharmacological VTE agent (>24hrs) due to surgical blood loss or risk of bleeding: yes

## 2021-11-21 ENCOUNTER — Encounter (HOSPITAL_COMMUNITY): Payer: Self-pay | Admitting: Neurosurgery

## 2021-11-21 DIAGNOSIS — M50022 Cervical disc disorder at C5-C6 level with myelopathy: Secondary | ICD-10-CM | POA: Diagnosis not present

## 2021-11-21 DIAGNOSIS — N183 Chronic kidney disease, stage 3 unspecified: Secondary | ICD-10-CM | POA: Diagnosis not present

## 2021-11-21 DIAGNOSIS — I129 Hypertensive chronic kidney disease with stage 1 through stage 4 chronic kidney disease, or unspecified chronic kidney disease: Secondary | ICD-10-CM | POA: Diagnosis not present

## 2021-11-21 DIAGNOSIS — M50021 Cervical disc disorder at C4-C5 level with myelopathy: Secondary | ICD-10-CM | POA: Diagnosis not present

## 2021-11-21 DIAGNOSIS — F17210 Nicotine dependence, cigarettes, uncomplicated: Secondary | ICD-10-CM | POA: Diagnosis not present

## 2021-11-21 DIAGNOSIS — Z20822 Contact with and (suspected) exposure to covid-19: Secondary | ICD-10-CM | POA: Diagnosis not present

## 2021-11-21 LAB — GLUCOSE, CAPILLARY: Glucose-Capillary: 327 mg/dL — ABNORMAL HIGH (ref 70–99)

## 2021-11-21 MED ORDER — CYCLOBENZAPRINE HCL 10 MG PO TABS: 10.0000 mg | ORAL_TABLET | Freq: Three times a day (TID) | ORAL | 0 refills | Status: AC | PRN

## 2021-11-21 MED ORDER — HYDROCODONE-ACETAMINOPHEN 5-325 MG PO TABS: 1.0000 | ORAL_TABLET | ORAL | 0 refills | Status: AC | PRN

## 2021-11-21 NOTE — Progress Notes (Signed)
Occupational Therapy Treatment Patient Details Name: Derrick Mosley MRN: [id number] DOB: Apr 06, 1976 Today's Date: 11/21/2021   History of present illness This 46 yo male s/p C4-5, C5-6 anterior cervical discectomy with interbody fusion. PHMx: TLIF L4-5 2021. peripheral neuropathy in legs and feet (knees down)   OT comments  Patient seen by skilled OT to address AE training for LB dressing, functional transfers, and grooming standing at sink. Patient provided LH shoehorn, sponge, reacher, and sock aide. Patient was demonstrated reacher and sock aide use and was able to return demonstration with verbal cues.  Patient instructed on sponge and shoehorn use. Patient performed transfers to [facility] to adjust height. Patient was able to perform grooming standing at sink with difficulty opening toothpaste and applying to toothbrush but was able to perform with time. Patient provided yellow theraputty to address fine motor.  Acute OT to continue to follow. Patient discharge recommendations remain appropriate.    Recommendations for follow up therapy are one component of a multi-disciplinary discharge planning process, led by the attending physician.  Recommendations may be updated based on patient status, additional functional criteria and insurance authorization.    Follow Up Recommendations  No OT follow up    Assistance Recommended at Discharge Intermittent Supervision/Assistance  Patient can return home with the following  A little help with walking and/or transfers;A little help with bathing/dressing/bathroom;Assistance with cooking/housework;Assist for transportation   Equipment Recommendations  BSC/3in1    Recommendations for Other Services      Precautions / Restrictions Precautions Precautions: Fall;Cervical Precaution Booklet Issued: Yes (comment) Required Braces or Orthoses:  (soft collar)       Mobility Bed Mobility Overal bed mobility: Needs Assistance Bed Mobility: Supine to  Sit     Supine to sit: Supervision, HOB elevated     General bed mobility comments: increased time due to pain    Transfers Overall transfer level: Needs assistance Equipment used: Rolling walker (2 wheels) Transfers: Sit to/from Stand Sit to Stand: Min guard           General transfer comment: transfer performed to [place] Overall balance assessment: Needs assistance Sitting-balance support: No upper extremity supported, Feet supported Sitting balance-Leahy Scale: Good     Standing balance support: No upper extremity supported, During functional activity Standing balance-Leahy Scale: Fair Standing balance comment: demonstrated jerky movements when standing but able to maintain balance                           ADL either performed or assessed with clinical judgement   ADL Overall ADL's : Needs assistance/impaired     Grooming: Wash/dry hands;Wash/dry face;Oral care;Min guard;Standing Grooming Details (indicate cue type and reason): difficulty opening toothpaste and applying to toothbrush         Upper Body Dressing : Set up;Sitting Upper Body Dressing Details (indicate cue type and reason): performed sitting on EOB Lower Body Dressing: Minimal assistance;Sit to/from stand;With adaptive equipment Lower Body Dressing Details (indicate cue type and reason): education on reacher and sock aide use for LB dressing with patient able to perform return demonstration Toilet Transfer: Min guard;Ambulation;BSC/3in1;Rolling walker (2 wheels) Toilet Transfer Details (indicate cue type and reason): performed transfer to new [facility] to allow for height adjustment           General ADL Comments: AE training performed with reacher and sock aide. LH shoehorn and sponge provided with education on use    Extremity/Trunk Assessment Upper  Extremity Assessment Upper Extremity Assessment: RUE deficits/detail;LUE deficits/detail RUE Coordination: decreased fine  motor LUE Coordination: decreased fine motor            Vision       Perception     Praxis      Cognition Arousal/Alertness: Awake/alert Behavior During Therapy: WFL for tasks assessed/performed Overall Cognitive Status: Within Functional Limits for tasks assessed                                 General Comments: demonstrated good understanding of AE use        Exercises      Shoulder Instructions       General Comments      Pertinent Vitals/ Pain       Pain Assessment Pain Assessment: Faces Faces Pain Scale: Hurts little more Pain Location: neck and back Pain Descriptors / Indicators: Aching, Sore Pain Intervention(s): Monitored during session, Repositioned  Home Living                                          Prior Functioning/Environment              Frequency  Min 2X/week        Progress Toward Goals  OT Goals(current goals can now be found in the care plan section)  Progress towards OT goals: Progressing toward goals  Acute Rehab OT Goals Patient Stated Goal: go home OT Goal Formulation: With patient/family Time For Goal Achievement: 12/04/21 Potential to Achieve Goals: Good ADL Goals Pt Will Perform Upper Body Bathing: sitting;Independently Pt Will Perform Lower Body Bathing: with modified independence;sit to/from stand Pt Will Transfer to Toilet: with modified independence;ambulating;bedside commode Additional ADL Goal #1: Pt will be Mod I in and OOB for basic ADLs (no rail, HOB flat, increased time)  Plan Discharge plan remains appropriate    Co-evaluation                 AM-PAC OT "6 Clicks" Daily Activity     Outcome Measure   Help from another person eating meals?: None Help from another person taking care of personal grooming?: A Little Help from another person toileting, which includes using toliet, bedpan, or urinal?: A Little Help from another person bathing (including washing,  rinsing, drying)?: A Little Help from another person to put on and taking off regular upper body clothing?: A Little Help from another person to put on and taking off regular lower body clothing?: A Little 6 Click Score: 19    End of Session Equipment Utilized During Treatment: Gait belt;Rolling walker (2 wheels)  OT Visit Diagnosis: Unsteadiness on feet (R26.81);Other abnormalities of gait and mobility (R26.89);Pain   Activity Tolerance Patient tolerated treatment well   Patient Left with family/visitor present (seated on EOB)   Nurse Communication Mobility status        Time: 8657-8469 OT Time Calculation (min): 44 min  Charges: OT General Charges $OT Visit: 1 Visit OT Treatments $Self Care/Home Management : 38-52 mins  Lodema Hong, [place]  Pager [phone] Office [place] 11/21/2021, 8:51 AM

## 2021-11-21 NOTE — Evaluation (Signed)
Physical Therapy Evaluation Patient Details Name: Derrick Mosley MRN: [id number] DOB: Apr 10, 1976 Today's Date: 11/21/2021  History of Present Illness  Pt is a 46 y/o male who presents s/p C4-C6 ACDF on 11/20/2021. PMH significant for TLIF L4-5 2021. peripheral neuropathy in legs and feet (knees down)  Clinical Impression  Pt admitted with above diagnosis. At the time of PT eval, pt was able to demonstrate transfers and ambulation with gross min guard assist and RW for support. Pt reporting increasing pain in hips and legs as mobility progressed. Encouraged short, frequent bouts of walking at home instead of 1 or 2 long walks during the day. Pt was educated on precautions, brace application/wearing schedule, appropriate activity progression, and car transfer. Pt currently with functional limitations due to the deficits listed below (see PT Problem List). Pt will benefit from skilled PT to increase their independence and safety with mobility to allow discharge to the venue listed below.         Recommendations for follow up therapy are one component of a multi-disciplinary discharge planning process, led by the attending physician.  Recommendations may be updated based on patient status, additional functional criteria and insurance authorization.  Follow Up Recommendations No PT follow up    Assistance Recommended at Discharge Set up Supervision/Assistance  Patient can return home with the following  A little help with walking and/or transfers;Help with stairs or ramp for entrance;Assist for transportation    Equipment Recommendations Rolling walker (2 wheels);BSC/3in1  Recommendations for Other Services       Functional Status Assessment Patient has had a recent decline in their functional status and demonstrates the ability to make significant improvements in function in a reasonable and predictable amount of time.     Precautions / Restrictions Precautions Precautions:  Fall;Cervical Precaution Booklet Issued: Yes (comment) Precaution Comments: Reviewed handout and pt was cued for precautions during functional mobility. Required Braces or Orthoses:  (soft collar) Restrictions Weight Bearing Restrictions: No      Mobility  Bed Mobility Overal bed mobility: Needs Assistance Bed Mobility: Rolling, Sidelying to Sit Rolling: Supervision Sidelying to sit: Min guard, HOB elevated       General bed mobility comments: HOB and rails lowered to simulate home environment. Pt was cued for log roll technique and close guard provided for transition to full sitting position due to increased time and effort to elevate trunk.    Transfers Overall transfer level: Needs assistance Equipment used: Rolling walker (2 wheels) Transfers: Sit to/from Stand Sit to Stand: Min guard           General transfer comment: Pt demonstrated proper hand placement on seated surface for safety. No assist required however close guard provided.    Ambulation/Gait Ambulation/Gait assistance: Min guard Gait Distance (Feet): 400 Feet Assistive device: Rolling walker (2 wheels) Gait Pattern/deviations: Step-through pattern, Decreased stride length, Trunk flexed Gait velocity: Decreased Gait velocity interpretation: 1.31 - 2.62 ft/sec, indicative of limited community ambulator   General Gait Details: VC's for improved posture, closer walker proximity, and forward gaze. No assist required however close guard provided for safety.  Stairs Stairs: Yes Stairs assistance: Min guard Stair Management: Two rails, Step to pattern, Forwards Number of Stairs: 4 General stair comments: VC's for sequencing and general safety.  Wheelchair Mobility    Modified Rankin (Stroke Patients Only)       Balance Overall balance assessment: Needs assistance Sitting-balance support: No upper extremity supported, Feet supported Sitting balance-Leahy Scale: Good     Standing  balance support:  No upper extremity supported, During functional activity Standing balance-Leahy Scale: Poor Standing balance comment: Reliant on UE support on walker or railings during dynamic movement.                             Pertinent Vitals/Pain Pain Assessment Pain Assessment: Faces Faces Pain Scale: Hurts little more Pain Location: neck and back Pain Descriptors / Indicators: Aching, Sore Pain Intervention(s): Limited activity within patient's tolerance, Monitored during session, Repositioned    Home Living Family/patient expects to be discharged to:: Private residence Living Arrangements: Spouse/significant other Available Help at Discharge: Family;Available PRN/intermittently Type of Home: House Home Access: Stairs to enter Entrance Stairs-Rails: Right;Left;Can reach both Entrance Stairs-Number of Steps: 3   Home Layout: One level Home Equipment: Cane - single point Additional Comments: On disability    Prior Function Prior Level of Function : Independent/Modified Independent                     Hand Dominance   Dominant Hand: Right    Extremity/Trunk Assessment   Upper Extremity Assessment Upper Extremity Assessment: Defer to OT evaluation    Lower Extremity Assessment Lower Extremity Assessment: Generalized weakness    Cervical / Trunk Assessment Cervical / Trunk Assessment: Neck Surgery  Communication   Communication: No difficulties  Cognition Arousal/Alertness: Awake/alert Behavior During Therapy: WFL for tasks assessed/performed Overall Cognitive Status: Within Functional Limits for tasks assessed                                          General Comments      Exercises     Assessment/Plan    PT Assessment Patient needs continued PT services  PT Problem List Decreased strength;Decreased activity tolerance;Decreased balance;Decreased mobility;Decreased knowledge of use of DME;Decreased safety awareness;Decreased  knowledge of precautions;Pain       PT Treatment Interventions DME instruction;Gait training;Stair training;Functional mobility training;Therapeutic activities;Therapeutic exercise;Neuromuscular re-education;Patient/family education;Balance training    PT Goals (Current goals can be found in the Care Plan section)  Acute Rehab PT Goals Patient Stated Goal: Home today PT Goal Formulation: With patient/family Time For Goal Achievement: 11/28/21 Potential to Achieve Goals: Good    Frequency Min 5X/week     Co-evaluation               AM-PAC PT "6 Clicks" Mobility  Outcome Measure Help needed turning from your back to your side while in a flat bed without using bedrails?: A Little Help needed moving from lying on your back to sitting on the side of a flat bed without using bedrails?: A Little Help needed moving to and from a bed to a chair (including a wheelchair)?: A Little Help needed standing up from a chair using your arms (e.g., wheelchair or bedside chair)?: A Little Help needed to walk in hospital room?: A Little Help needed climbing 3-5 steps with a railing? : A Little 6 Click Score: 18    End of Session Equipment Utilized During Treatment: Gait belt;Cervical collar Activity Tolerance: Patient tolerated treatment well Patient left: in bed;with call bell/phone within reach;with family/visitor present (Sitting EOB) Nurse Communication: Mobility status PT Visit Diagnosis: Unsteadiness on feet (R26.81);Pain Pain - part of body:  (neck)    Time: 2774-1287 PT Time Calculation (min) (ACUTE ONLY): 21 min   Charges:   PT  Evaluation $PT Eval Low Complexity: 1 Low         Rolinda Roan, PT, DPT Acute Rehabilitation Services Pager: [phone] Office: [phone]   Thelma Comp 11/21/2021, 1:46 PM

## 2021-11-21 NOTE — Discharge Summary (Signed)
Physician Discharge Summary  Patient ID: Derrick Mosley MRN: [id number] DOB/AGE: [birth year] 46 y.o.  Admit date: 11/20/2021 Discharge date: 11/21/2021  Admission Diagnoses:  Discharge Diagnoses:  Principal Problem:   Cervical myelopathy [facility]: Lambert Rhonda W)   Discharged Condition: good  Hospital Course: Patient admitted to hospital where he underwent uncomplicated two-level anterior cervical decompression fusion for treatment of his compressive cervical myelopathy.  Postoperatively he is doing very well.  Preoperative pain numbness and weakness are all much improved.  He is having the expected amount of incisional pain.  He is swallowing well.  His voice is strong.  He is ambulating and voiding without difficulty.  He is ready for discharge home.  Consults:   Significant Diagnostic Studies:   Treatments:   Discharge Exam: Blood pressure (!) 142/90, pulse 100, temperature 99.4 F (37.4 C), temperature source Oral, resp. rate 18, height _0  (1.803 m), weight 99.8 kg, SpO2 98 %. Awake and alert.  Oriented and appropriate.  Motor examination intact.  Sensory examination with some mild distal sensory loss in both distal upper extremities.  Wound clean and dry.  Neck soft.  Chest and abdomen benign.  Disposition: Discharge disposition: 01-Home or Self Care        Allergies as of 11/21/2021   No Known Allergies      Medication List     TAKE these medications    Accu-Chek FastClix Lancets Misc Use 3 times a day   amLODipine 10 MG tablet Commonly known as: NORVASC Take 5 mg by mouth daily.   aspirin 81 MG EC tablet Take 1 tablet (81 mg total) by mouth daily.   atorvastatin 80 MG tablet Commonly known as: LIPITOR Take 1 tablet (80 mg total) by mouth daily at 6 PM. What changed: how much to take   cholecalciferol 25 MCG (1000 UNIT) tablet Commonly known as: VITAMIN D3 Take 1,000 Units by mouth daily.   cyclobenzaprine 10 MG tablet Commonly known as: FLEXERIL Take 1  tablet (10 mg total) by mouth 3 (three) times daily as needed for muscle spasms.   FreeStyle Libre 2 Sensor Misc 1 each by Does not apply route every 14 (fourteen) days.   gabapentin 100 MG capsule Commonly known as: NEURONTIN Take 1 capsule (100 mg total) by mouth at bedtime. What changed: how much to take   glucose blood test strip Commonly known as: Accu-Chek Guide Use 3 times a day   HYDROcodone-acetaminophen 5-325 MG tablet Commonly known as: NORCO/VICODIN Take 1 tablet by mouth every 6 (six) hours as needed. What changed: Another medication with the same name was added. Make sure you understand how and when to take each.   HYDROcodone-acetaminophen 5-325 MG tablet Commonly known as: NORCO/VICODIN Take 1 tablet by mouth every 4 (four) hours as needed for moderate pain ((score 4 to 6)). What changed: You were already taking a medication with the same name, and this prescription was added. Make sure you understand how and when to take each.   Insulin Pen Needle 32G X 4 MM Misc Use 4x a day   INSULIN SYRINGE 1CC/30GX1/2" 30G X 1/2" 1 ML Misc 1 Device by Does not apply route 2 (two) times daily before a meal.   isosorbide mononitrate 60 MG 24 hr tablet Commonly known as: IMDUR Take 60 mg by mouth every morning.   meclizine 25 MG tablet Commonly known as: ANTIVERT Take 1 tablet (25 mg total) by mouth 3 (three) times daily as needed for dizziness.   methocarbamol 500 MG tablet  Commonly known as: ROBAXIN Take 500 mg by mouth every 6 (six) hours as needed for muscle spasms.   metoprolol tartrate 50 MG tablet Commonly known as: LOPRESSOR Take 50 mg by mouth 2 (two) times daily.   Narcan 4 MG/0.1ML Liqd nasal spray kit Generic drug: naloxone 0.4 mg once.   nitroGLYCERIN 0.4 MG SL tablet Commonly known as: NITROSTAT Place 0.4 mg under the tongue every 5 (five) minutes as needed for chest pain.   nystatin-triamcinolone ointment Commonly known as: MYCOLOG Apply between  4th and 5th toe left foot once daily   ondansetron 4 MG tablet Commonly known as: Zofran Take 1 tablet (4 mg total) by mouth every 8 (eight) hours as needed for nausea or vomiting.   Ozempic (0.25 or 0.5 MG/DOSE) 2 MG/1.5ML Sopn Generic drug: Semaglutide(0.25 or 0.5MG/DOS) Inject 0.5 mg into the skin once a week.   predniSONE 10 MG tablet Commonly known as: DELTASONE Take 5 tablets (50 mg total) by mouth daily.   ticagrelor 90 MG Tabs tablet Commonly known as: BRILINTA Take 1 tablet (90 mg total) by mouth 2 (two) times daily.   torsemide 20 MG tablet Commonly known as: DEMADEX Take 20 mg by mouth 2 (two) times daily.   Tyler Aas FlexTouch 100 UNIT/ML FlexTouch Pen Generic drug: insulin degludec Inject 30 Units into the skin daily.               Durable Medical Equipment  (From admission, onward)           Start     Ordered   11/21/21 0742  For home use only DME Walker rolling  Once       Question Answer Comment  Walker: With [employer] Wheels   Patient needs a walker to treat with the following condition Unsteady gait when walking      11/21/21 0741   11/21/21 0742  For home use only DME 3 n 1  Once        11/21/21 0741            Follow-up Information     [facility] up.   Specialty: Home Health Services Contact information: [place] [address] [phone]                 Signed: Charlie Pitter 11/21/2021, 9:20 AM

## 2021-11-21 NOTE — Discharge Instructions (Addendum)
Wound Care Keep incision covered and dry for two days.  If you shower, cover incision with plastic wrap.  Do not put any creams, lotions, or ointments on incision. Leave steri-strips on neck.  They will fall off by themselves. Activity Walk each and every day, increasing distance each day. No lifting greater than 5 lbs.  Avoid excessive neck motion. No driving for 2 weeks; may ride as a passenger locally. Wear collar at all times except for shower Diet Resume your normal diet.  Return to Work Will be discussed at you follow up appointment. Call Your Doctor If Any of These Occur Redness, drainage, or swelling at the wound.  Temperature greater than 101 degrees. Severe pain not relieved by pain medication. Incision starts to come apart. Follow Up Appt Call today for appointment in 1-2 weeks ([phone]) or for problems.  If you have any hardware placed in your spine, you will need an x-ray before your appointment.    Resume aspirin Wednesday   Resume Brilinta Friday

## 2021-11-21 NOTE — Progress Notes (Signed)
Patient alert and oriented, mae's well, voiding adequate amount of urine, swallowing without difficulty, no c/o pain at time of discharge. Patient discharged home with family. Script and discharged instructions given to patient. Patient and family stated understanding of instructions given. Patient has an appointment with Dr. Pool in 2 weeks 

## 2021-11-22 DIAGNOSIS — Z794 Long term (current) use of insulin: Secondary | ICD-10-CM | POA: Diagnosis not present

## 2021-11-22 DIAGNOSIS — Z48811 Encounter for surgical aftercare following surgery on the nervous system: Secondary | ICD-10-CM | POA: Diagnosis not present

## 2021-11-22 DIAGNOSIS — Z9181 History of falling: Secondary | ICD-10-CM | POA: Diagnosis not present

## 2021-11-22 DIAGNOSIS — Z7901 Long term (current) use of anticoagulants: Secondary | ICD-10-CM | POA: Diagnosis not present

## 2021-11-22 DIAGNOSIS — E119 Type 2 diabetes mellitus without complications: Secondary | ICD-10-CM | POA: Diagnosis not present

## 2021-11-22 DIAGNOSIS — E785 Hyperlipidemia, unspecified: Secondary | ICD-10-CM | POA: Diagnosis not present

## 2021-11-22 DIAGNOSIS — Z7982 Long term (current) use of aspirin: Secondary | ICD-10-CM | POA: Diagnosis not present

## 2021-11-22 DIAGNOSIS — E669 Obesity, unspecified: Secondary | ICD-10-CM | POA: Diagnosis not present

## 2021-11-22 DIAGNOSIS — Z87891 Personal history of nicotine dependence: Secondary | ICD-10-CM | POA: Diagnosis not present

## 2021-11-22 DIAGNOSIS — I10 Essential (primary) hypertension: Secondary | ICD-10-CM | POA: Diagnosis not present

## 2021-11-23 ENCOUNTER — Ambulatory Visit: Payer: Medicare Other | Admitting: Internal Medicine
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
## 2021-11-24 DIAGNOSIS — Z48811 Encounter for surgical aftercare following surgery on the nervous system: Secondary | ICD-10-CM | POA: Diagnosis not present

## 2021-11-24 DIAGNOSIS — E669 Obesity, unspecified: Secondary | ICD-10-CM | POA: Diagnosis not present

## 2021-11-24 DIAGNOSIS — E785 Hyperlipidemia, unspecified: Secondary | ICD-10-CM | POA: Diagnosis not present

## 2021-11-24 DIAGNOSIS — Z7901 Long term (current) use of anticoagulants: Secondary | ICD-10-CM | POA: Diagnosis not present

## 2021-11-24 DIAGNOSIS — I10 Essential (primary) hypertension: Secondary | ICD-10-CM | POA: Diagnosis not present

## 2021-11-24 DIAGNOSIS — E119 Type 2 diabetes mellitus without complications: Secondary | ICD-10-CM | POA: Diagnosis not present

## 2021-11-27 DIAGNOSIS — E669 Obesity, unspecified: Secondary | ICD-10-CM | POA: Diagnosis not present

## 2021-11-27 DIAGNOSIS — Z7901 Long term (current) use of anticoagulants: Secondary | ICD-10-CM | POA: Diagnosis not present

## 2021-11-27 DIAGNOSIS — Z48811 Encounter for surgical aftercare following surgery on the nervous system: Secondary | ICD-10-CM | POA: Diagnosis not present

## 2021-11-27 DIAGNOSIS — E785 Hyperlipidemia, unspecified: Secondary | ICD-10-CM | POA: Diagnosis not present

## 2021-11-27 DIAGNOSIS — I10 Essential (primary) hypertension: Secondary | ICD-10-CM | POA: Diagnosis not present

## 2021-11-27 DIAGNOSIS — E119 Type 2 diabetes mellitus without complications: Secondary | ICD-10-CM | POA: Diagnosis not present

## 2021-12-04 ENCOUNTER — Ambulatory Visit: Payer: Medicare Other | Admitting: Podiatry

## 2021-12-04 DIAGNOSIS — E1122 Type 2 diabetes mellitus with diabetic chronic kidney disease: Secondary | ICD-10-CM | POA: Diagnosis not present

## 2021-12-04 DIAGNOSIS — I739 Peripheral vascular disease, unspecified: Secondary | ICD-10-CM | POA: Diagnosis not present

## 2021-12-04 DIAGNOSIS — N184 Chronic kidney disease, stage 4 (severe): Secondary | ICD-10-CM | POA: Diagnosis not present

## 2021-12-04 DIAGNOSIS — D631 Anemia in chronic kidney disease: Secondary | ICD-10-CM | POA: Diagnosis not present

## 2021-12-04 DIAGNOSIS — I129 Hypertensive chronic kidney disease with stage 1 through stage 4 chronic kidney disease, or unspecified chronic kidney disease: Secondary | ICD-10-CM | POA: Diagnosis not present

## 2021-12-04 DIAGNOSIS — I213 ST elevation (STEMI) myocardial infarction of unspecified site: Secondary | ICD-10-CM | POA: Diagnosis not present

## 2021-12-04 DIAGNOSIS — N179 Acute kidney failure, unspecified: Secondary | ICD-10-CM | POA: Diagnosis not present

## 2021-12-04 DIAGNOSIS — N189 Chronic kidney disease, unspecified: Secondary | ICD-10-CM | POA: Diagnosis not present

## 2021-12-04 DIAGNOSIS — R768 Other specified abnormal immunological findings in serum: Secondary | ICD-10-CM | POA: Diagnosis not present

## 2021-12-04 DIAGNOSIS — N2581 Secondary hyperparathyroidism of renal origin: Secondary | ICD-10-CM | POA: Diagnosis not present

## 2021-12-04 DIAGNOSIS — Z720 Tobacco use: Secondary | ICD-10-CM | POA: Diagnosis not present

## 2021-12-12 ENCOUNTER — Ambulatory Visit: Payer: Medicare Other | Admitting: Internal Medicine

## 2021-12-20 DIAGNOSIS — H34812 Central retinal vein occlusion, left eye, with macular edema: Secondary | ICD-10-CM | POA: Diagnosis not present

## 2021-12-21 ENCOUNTER — Other Ambulatory Visit: Payer: Self-pay

## 2021-12-21 ENCOUNTER — Encounter: Payer: Self-pay | Admitting: Internal Medicine

## 2021-12-21 ENCOUNTER — Ambulatory Visit (INDEPENDENT_AMBULATORY_CARE_PROVIDER_SITE_OTHER): Payer: Medicare Other | Admitting: Internal Medicine

## 2021-12-21 VITALS — BP 128/82 | HR 92 | Ht 71.0 in | Wt 217.4 lb

## 2021-12-21 DIAGNOSIS — E1165 Type 2 diabetes mellitus with hyperglycemia: Secondary | ICD-10-CM | POA: Diagnosis not present

## 2021-12-21 DIAGNOSIS — E1159 Type 2 diabetes mellitus with other circulatory complications: Secondary | ICD-10-CM | POA: Diagnosis not present

## 2021-12-21 DIAGNOSIS — E663 Overweight: Secondary | ICD-10-CM

## 2021-12-21 DIAGNOSIS — E782 Mixed hyperlipidemia: Secondary | ICD-10-CM

## 2021-12-21 MED ORDER — TRESIBA FLEXTOUCH 100 UNIT/ML ~~LOC~~ SOPN: 30.0000 [IU] | PEN_INJECTOR | Freq: Every day | SUBCUTANEOUS | 3 refills | Status: AC
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
## 2021-12-21 NOTE — Patient Instructions (Addendum)
Please continue: - Tresiba 30 units daily - Ozempic 0.5 mg weekly  Please attach the CGM.   Please return in 1.5 months.

## 2022-01-03 DIAGNOSIS — G959 Disease of spinal cord, unspecified: Secondary | ICD-10-CM | POA: Diagnosis not present

## 2022-01-03 DIAGNOSIS — I10 Essential (primary) hypertension: Secondary | ICD-10-CM | POA: Diagnosis not present

## 2022-01-03 DIAGNOSIS — Z6831 Body mass index (BMI) 31.0-31.9, adult: Secondary | ICD-10-CM | POA: Diagnosis not present

## 2022-01-17 ENCOUNTER — Encounter (HOSPITAL_COMMUNITY): Payer: Self-pay | Admitting: Emergency Medicine

## 2022-01-17 ENCOUNTER — Other Ambulatory Visit: Payer: Self-pay

## 2022-01-17 ENCOUNTER — Emergency Department (HOSPITAL_COMMUNITY)
Admission: EM | Admit: 2022-01-17 | Discharge: 2022-01-17 | Disposition: A | Discharge status: Home / Self Care | Payer: Medicare Other | Attending: Emergency Medicine | Admitting: Emergency Medicine

## 2022-01-17 DIAGNOSIS — E119 Type 2 diabetes mellitus without complications: Secondary | ICD-10-CM | POA: Diagnosis not present

## 2022-01-17 DIAGNOSIS — W25XXXA Contact with sharp glass, initial encounter: Secondary | ICD-10-CM | POA: Insufficient documentation

## 2022-01-17 DIAGNOSIS — S01401A Unspecified open wound of right cheek and temporomandibular area, initial encounter: Secondary | ICD-10-CM | POA: Diagnosis not present

## 2022-01-17 DIAGNOSIS — S01301A Unspecified open wound of right ear, initial encounter: Secondary | ICD-10-CM | POA: Diagnosis not present

## 2022-01-17 DIAGNOSIS — S0990XA Unspecified injury of head, initial encounter: Secondary | ICD-10-CM | POA: Diagnosis present

## 2022-01-17 DIAGNOSIS — Z7982 Long term (current) use of aspirin: Secondary | ICD-10-CM | POA: Insufficient documentation

## 2022-01-17 DIAGNOSIS — Z794 Long term (current) use of insulin: Secondary | ICD-10-CM | POA: Diagnosis not present

## 2022-01-17 DIAGNOSIS — S0181XA Laceration without foreign body of other part of head, initial encounter: Secondary | ICD-10-CM | POA: Diagnosis not present

## 2022-01-17 MED ORDER — BACITRACIN ZINC 500 UNIT/GM EX OINT
TOPICAL_OINTMENT | Freq: Two times a day (BID) | CUTANEOUS | Status: DC
  Filled 2022-01-17: qty 1.8

## 2022-01-17 NOTE — ED Provider Notes (Signed)
?[place] ?Provider Note ? ? ?CSN: [id number] ?Arrival date & time: 01/17/22  1856 ? ?  ? ?History ? ?Chief Complaint  ?Patient presents with  ? Laceration  ? ? ?Derrick Mosley is a 46 y.o. male. ? ?46 year old male with past medical history of diabetes presents with concern for wounds to the right side of his face.  Patient states that he was standing next to a window that broke for unknown reasons resulting in minor lacerations to his right cheek and right ear and he is concerned he could possibly have retained glass in his wounds.  He states his last tetanus was less than 5 years ago.  No active bleeding.  No other injuries, complaints, concerns. ? ? ?  ? ?Home Medications ?Prior to Admission medications   ?Medication Sig Start Date End Date Taking? Authorizing Provider  ?ACCU-CHEK FASTCLIX LANCETS MISC Use 3 times a day 02/27/18   Philemon Kingdom, MD  ?amLODipine (NORVASC) 10 MG tablet Take 5 mg by mouth daily. 08/06/18   [provider]  ?aspirin EC 81 MG EC tablet Take 1 tablet (81 mg total) by mouth daily. 02/01/18   Charolette Forward, MD  ?atorvastatin (LIPITOR) 80 MG tablet Take 1 tablet (80 mg total) by mouth daily at 6 PM. ?Patient taking differently: Take 40 mg by mouth daily at 6 PM. 01/31/18   Charolette Forward, MD  ?cholecalciferol (VITAMIN D3) 25 MCG (1000 UNIT) tablet Take 1,000 Units by mouth daily.    [provider]  ?Continuous Blood Gluc Sensor (FREESTYLE LIBRE 2 SENSOR) MISC 1 each by Does not apply route every 14 (fourteen) days. 07/24/21   Philemon Kingdom, MD  ?cyclobenzaprine (FLEXERIL) 10 MG tablet Take 1 tablet (10 mg total) by mouth 3 (three) times daily as needed for muscle spasms. 11/21/21   Earnie Larsson, MD  ?gabapentin (NEURONTIN) 100 MG capsule Take 1 capsule (100 mg total) by mouth at bedtime. ?Patient taking differently: Take 300 mg by mouth at bedtime. 08/12/18   Trula Slade, DPM  ?glucose blood (ACCU-CHEK GUIDE) test strip Use 3 times a day  02/27/18   Philemon Kingdom, MD  ?HYDROcodone-acetaminophen (NORCO/VICODIN) 5-325 MG tablet Take 1 tablet by mouth every 4 (four) hours as needed for moderate pain ((score 4 to 6)). 11/21/21   Earnie Larsson, MD  ?insulin degludec (TRESIBA FLEXTOUCH) 100 UNIT/ML FlexTouch Pen Inject 30 Units into the skin daily. 12/21/21   Philemon Kingdom, MD  ?Insulin Pen Needle 32G X 4 MM MISC Use 4x a day 10/27/19   Philemon Kingdom, MD  ?Insulin Syringe-Needle U-100 (INSULIN SYRINGE 1CC/30GX1/2") 30G X 1/2" 1 ML MISC 1 Device by Does not apply route 2 (two) times daily before a meal. 01/13/17   Donne Hazel, MD  ?isosorbide mononitrate (IMDUR) 60 MG 24 hr tablet Take 60 mg by mouth every morning. 10/13/21   [provider]  ?meclizine (ANTIVERT) 25 MG tablet Take 1 tablet (25 mg total) by mouth 3 (three) times daily as needed for dizziness. ?Patient not taking: Reported on 11/07/2021 09/21/19   Heath Lark D, DO  ?methocarbamol (ROBAXIN) 500 MG tablet Take 500 mg by mouth every 6 (six) hours as needed for muscle spasms.    [provider]  ?metoprolol tartrate (LOPRESSOR) 50 MG tablet Take 50 mg by mouth 2 (two) times daily. 10/23/21   [provider]  ?NARCAN 4 MG/0.1ML LIQD nasal spray kit 0.4 mg once. 06/03/20   [provider]  ?nitroGLYCERIN (NITROSTAT) 0.4  MG SL tablet Place 0.4 mg under the tongue every 5 (five) minutes as needed for chest pain.    [provider]  ?nystatin-triamcinolone ointment (MYCOLOG) Apply between 4th and 5th toe left foot once daily 11/03/18   Marzetta Board, DPM  ?ondansetron (ZOFRAN) 4 MG tablet Take 1 tablet (4 mg total) by mouth every 8 (eight) hours as needed for nausea or vomiting. 10/27/20   Melina Schools, MD  ?predniSONE (DELTASONE) 10 MG tablet Take 5 tablets (50 mg total) by mouth daily. ?Patient not taking: Reported on 11/07/2021 09/11/21   Varney Biles, MD  ?Semaglutide,0.25 or 0.5MG/DOS, (OZEMPIC, 0.25 OR 0.5 MG/DOSE,) 2 MG/1.5ML SOPN  Inject 0.5 mg into the skin once a week. 07/24/21   Philemon Kingdom, MD  ?ticagrelor (BRILINTA) 90 MG TABS tablet Take 1 tablet (90 mg total) by mouth 2 (two) times daily. 01/31/18   Charolette Forward, MD  ?torsemide (DEMADEX) 20 MG tablet Take 20 mg by mouth 2 (two) times daily. 10/13/21   [provider]  ?   ? ?Allergies    ?Patient has no known allergies.   ? ?Review of Systems   ?Review of Systems ?Negative except as per HPI ?Physical Exam ?Updated Vital Signs ?BP (!) 178/104 (BP Location: Right Arm)   Pulse 98   Temp 98.3 ?F (36.8 ?C) (Oral)   Resp 16   Ht 5' 11"  (1.803 m)   Wt 98.4 kg   SpO2 100%   BMI 30.27 kg/m?  ?Physical Exam ?Vitals and nursing note reviewed.  ?Constitutional:   ?   General: He is not in acute distress. ?   Appearance: He is well-developed. He is not diaphoretic.  ?HENT:  ?   Head: Normocephalic and atraumatic.  ?   Nose: Nose normal.  ?Eyes:  ?   Extraocular Movements: Extraocular movements intact.  ?   Pupils: Pupils are equal, round, and reactive to light.  ?Pulmonary:  ?   Effort: Pulmonary effort is normal.  ?Musculoskeletal:  ?   Cervical back: Neck supple.  ?Skin: ?   General: Skin is warm and dry.  ?   Findings: No erythema or rash.  ?   Comments: 2-3 scattered superficial appearing wounds to the right cheek and right ear.  No active bleeding  ?Neurological:  ?   Mental Status: He is alert and oriented to person, place, and time.  ?Psychiatric:     ?   Behavior: Behavior normal.  ? ? ?ED Results / Procedures / Treatments   ?Labs ?(all labs ordered are listed, but only abnormal results are displayed) ?Labs Reviewed - No data to display ? ?EKG ?None ? ?Radiology ?No results found. ? ?Procedures ?Procedures  ? ? ?Medications Ordered in ED ?Medications  ?bacitracin ointment (has no administration in time range)  ? ? ?ED Course/ Medical Decision Making/ A&P ?  ?                        ?Medical Decision Making ? ?46 year old male presents with concern for wounds to his  right face after standing next to a window that broke yesterday.  Patient is vague or does not know the details as to why the window broke.  He cleaned the wounds last night and did not feel any glass in the wounds at that time.  His tetanus is up-to-date.  There is no obvious retained foreign body.  Offered to probe wound however patient has declined at this time.  Recommend standard wound care with topical antibiotic and wound recheck with PCP in 2 days.  Discussed possibility of retained glass which may work its way out eventually. ? ? ? ? ? ? ? ?Final Clinical Impression(s) / ED Diagnoses ?Final diagnoses:  ?Facial laceration, initial encounter  ? ? ?Rx / DC Orders ?ED Discharge Orders   ? ? None  ? ?  ? ? ?  ?Tacy Learn, PA-C ?01/17/22 1957 ? ?  ?Davonna Belling, MD ?01/17/22 2339 ? ?

## 2022-01-17 NOTE — ED Notes (Signed)
Lacerations cleaned with saline and gauze and covered with bacitracin ointment  ?

## 2022-01-17 NOTE — ED Triage Notes (Signed)
Pt c/o 4 small lacerations from scattered glass since last night; pt reports he feels as if glass is in the lacerations (2 to right ear and 2 to right side of face) ?

## 2022-01-17 NOTE — Discharge Instructions (Signed)
Wound care as per discharge instructions.  Recommend wound recheck with your PCP in 2 days. ?

## 2022-01-24 ENCOUNTER — Encounter: Payer: Self-pay | Admitting: Podiatry

## 2022-01-24 ENCOUNTER — Ambulatory Visit (INDEPENDENT_AMBULATORY_CARE_PROVIDER_SITE_OTHER): Payer: Medicare Other | Admitting: Podiatry

## 2022-01-24 DIAGNOSIS — L84 Corns and callosities: Secondary | ICD-10-CM | POA: Diagnosis not present

## 2022-01-24 DIAGNOSIS — E1142 Type 2 diabetes mellitus with diabetic polyneuropathy: Secondary | ICD-10-CM

## 2022-01-24 DIAGNOSIS — M79675 Pain in left toe(s): Secondary | ICD-10-CM | POA: Diagnosis not present

## 2022-01-24 DIAGNOSIS — Z89431 Acquired absence of right foot: Secondary | ICD-10-CM | POA: Diagnosis not present

## 2022-01-24 DIAGNOSIS — B351 Tinea unguium: Secondary | ICD-10-CM

## 2022-01-24 DIAGNOSIS — M79674 Pain in right toe(s): Secondary | ICD-10-CM | POA: Diagnosis not present

## 2022-01-28 NOTE — Progress Notes (Signed)
?  Subjective:  ?Patient ID: Derrick Mosley, male    DOB: [birth year],  MRN: [id number] ? ?[name] presents to clinic today for at risk foot care. Patient has h/o amputation of 5th ray amputation right lower extremity and thick, elongated toenails b/l lower extremities which are tender when wearing enclosed shoe gear. ? ?Patient states blood glucose was 150 mg/dl today.  Last HgA1c was 7.9%. ? ?New problem(s): None.  ? ?PCP is Lavella Lemons, [place] , and last visit was July, 2022. ? ?No Known Allergies ? ?Review of Systems: Negative except as noted in the HPI. ? ?Objective: No changes noted in today's physical examination. ?Constitutional Derrick Mosley is a pleasant 46 y.o. African American male, WD, WN in NAD. AAO x 3.   ?Vascular Capillary refill time to digits immediate b/l. Palpable DP pulse(s) left lower extremity Faintly palpable DP pulse(s) RLE. Faintly palpable PT pulse(s) b/l lower extremities. Lower extremity skin temperature gradient within normal limits. No cyanosis or clubbing noted.  ?Neurologic Normal speech. Oriented to person, place, and time. Protective sensation diminished with 10g monofilament b/l. Vibratory sensation diminished b/l.  ?Dermatologic Pedal integument with normal turgor, texture and tone b/l LE. No open wounds b/l. No interdigital macerations b/l. Toenails 1-5 left, R hallux, R 2nd toe, R 3rd toe, and R 4th toe elongated, thickened, discolored with subungual debris. +Tenderness with dorsal palpation of nailplates. Hyperkeratotic lesion plantar hallux iPJ left foot.  ?Orthopedic: Muscle strength 5/5 to all lower extremity muscle groups bilaterally. Lower extremity amputation(s): 5th ray amputation right lower extremity. Patient ambulates independent of any assistive aids.   ? ?Radiographs: None ? ?  Latest Ref Rng & Units 11/15/2021  ?  2:40 PM 07/24/2021  ? 12:12 PM  ?Hemoglobin A1C  ?Hemoglobin-A1c 4.8 - 5.6 % 8.3   7.2    ? ?Assessment/Plan: ?1. Pain due to onychomycosis of  toenails of both feet   ?2. Callus   ?3. 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 Diabetic peripheral neuropathy associated with type 2 diabetes mellitus ([place])   ?-No new findings. No new orders. ?-Mycotic toenails 1-5 left foot and 1-4 right foot were debrided in length and girth with sterile nail nippers and dremel without iatrogenic bleeding. ?-Callus(es) L hallux pared utilizing sterile scalpel blade without complication or incident. Total number debrided =1. ?-Patient/POA to call should there be question/concern in the interim.  ? ?Return in about 3 months (around 04/26/2022). ? ?Marzetta Board, DPM  ?

## 2022-01-31 DIAGNOSIS — E113491 Type 2 diabetes mellitus with severe nonproliferative diabetic retinopathy without macular edema, right eye: Secondary | ICD-10-CM | POA: Diagnosis not present

## 2022-01-31 DIAGNOSIS — H34812 Central retinal vein occlusion, left eye, with macular edema: Secondary | ICD-10-CM | POA: Diagnosis not present

## 2022-02-01 DIAGNOSIS — G959 Disease of spinal cord, unspecified: Secondary | ICD-10-CM | POA: Diagnosis not present

## 2022-02-09 ENCOUNTER — Ambulatory Visit: Payer: Medicare Other | Admitting: Internal Medicine
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
## 2022-03-15 ENCOUNTER — Telehealth: Payer: Self-pay | Admitting: Podiatry

## 2022-03-15 NOTE — Telephone Encounter (Signed)
Derrick Mosley would like to have a prescription for diabetic shoes sent to [facility] in [place]. He said that you would need to call medical in the facility. The number is [phone] and fax is [phone]. I spoke with the Nurse Rachel Moulds) and shes not sure the process of if we would need to send the shoe. He can have the shoes if they are medically necessary. She said to fax over the prescription to [phone]. Thank you

## 2022-03-21 ENCOUNTER — Encounter: Payer: Self-pay | Admitting: Podiatry

## 2022-03-21 ENCOUNTER — Other Ambulatory Visit: Payer: Self-pay | Admitting: Podiatry

## 2022-03-21 DIAGNOSIS — L84 Corns and callosities: Secondary | ICD-10-CM

## 2022-03-21 DIAGNOSIS — E1142 Type 2 diabetes mellitus with diabetic polyneuropathy: Secondary | ICD-10-CM

## 2022-03-21 DIAGNOSIS — Z89431 Acquired absence of right foot: Secondary | ICD-10-CM

## 2022-04-04 NOTE — Telephone Encounter (Signed)
Called number listed in message to speak with nurse to discuss diabetic shoes. No answer.

## 2022-05-04 ENCOUNTER — Ambulatory Visit: Payer: Medicare Other | Admitting: Podiatry

## 2022-07-08 IMAGING — DX DG CHEST 1V PORT
1 series · 1 of 1 positions shown · non-contrast
Comparison: 10/25/2020

CLINICAL DATA: Chest pain

EXAM:
PORTABLE CHEST 1 VIEW

[chest ap]
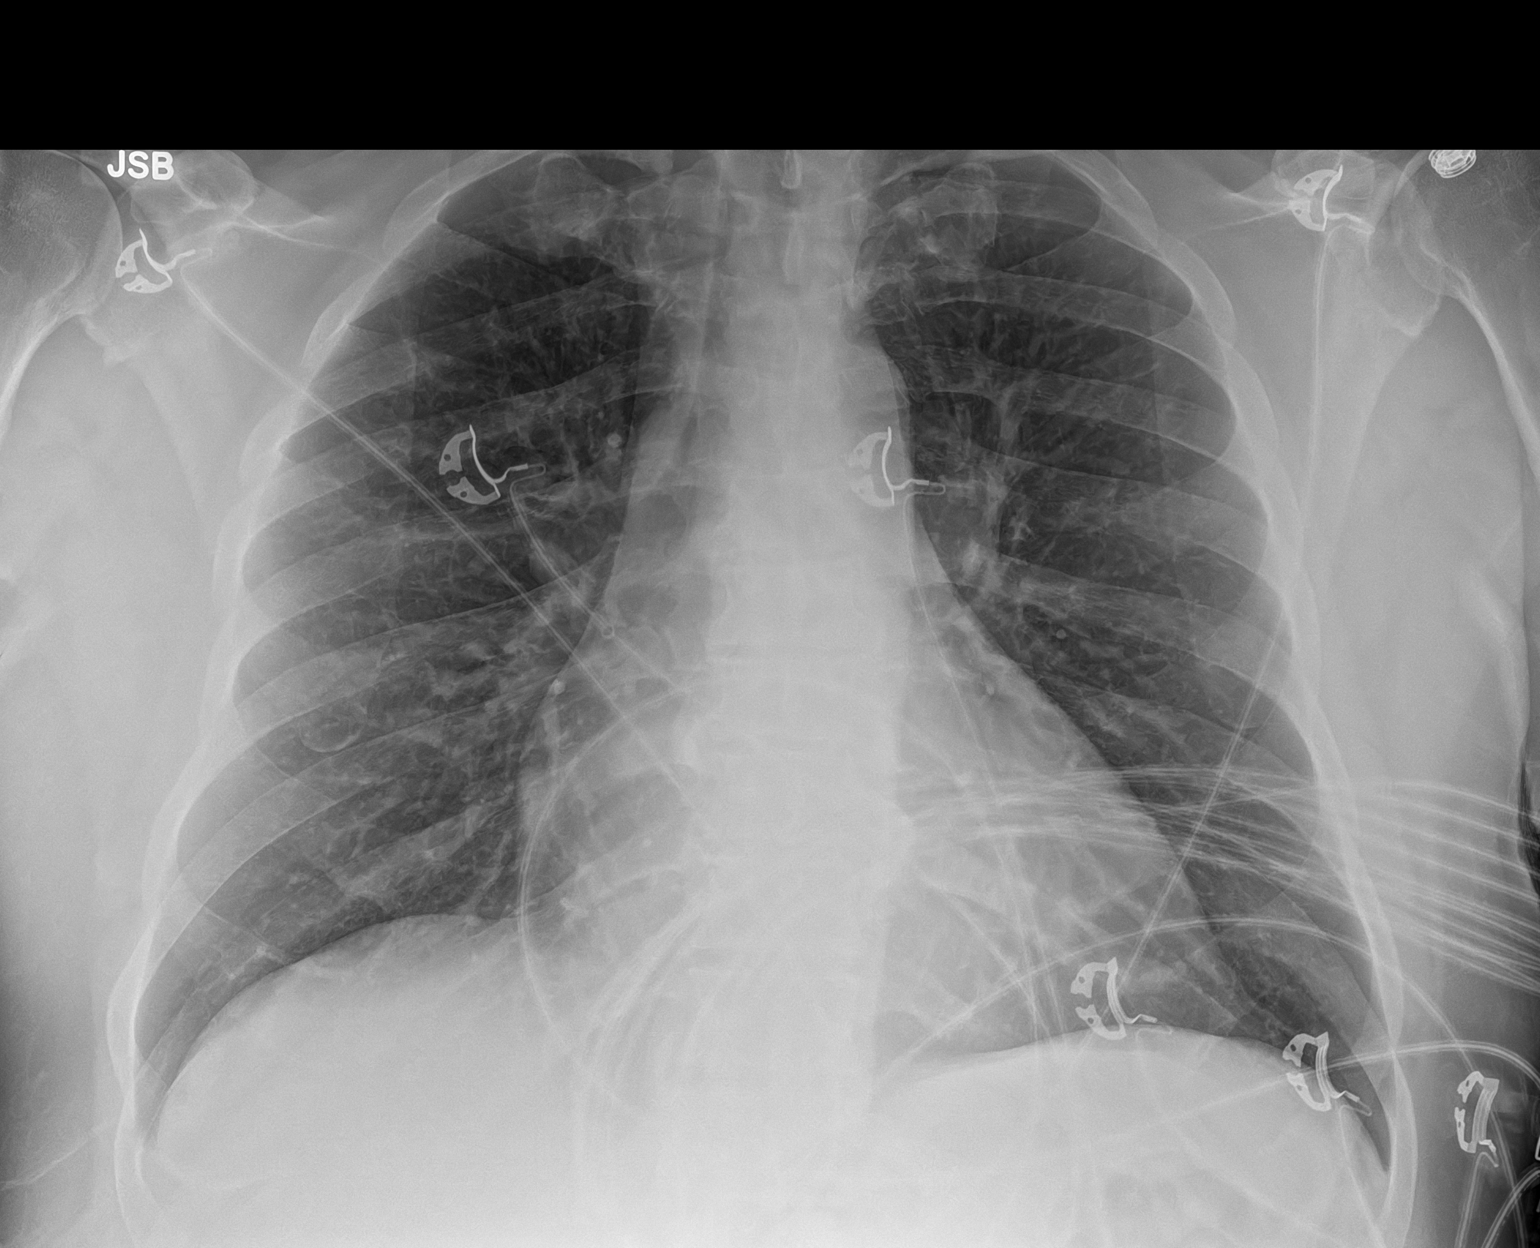

[1 of 1 positions shown; findings below may reference images not displayed]

FINDINGS: Heart and mediastinal contours are within normal limits. No focal
opacities or effusions. No acute bony abnormality.
IMPRESSION: No active disease.

## 2022-12-14 DIAGNOSIS — E875 Hyperkalemia: Secondary | ICD-10-CM | POA: Diagnosis not present

## 2022-12-14 DIAGNOSIS — N186 End stage renal disease: Secondary | ICD-10-CM | POA: Diagnosis not present

## 2022-12-14 DIAGNOSIS — D631 Anemia in chronic kidney disease: Secondary | ICD-10-CM | POA: Diagnosis not present

## 2022-12-15 DIAGNOSIS — N186 End stage renal disease: Secondary | ICD-10-CM | POA: Diagnosis not present

## 2022-12-15 DIAGNOSIS — E875 Hyperkalemia: Secondary | ICD-10-CM | POA: Diagnosis not present

## 2022-12-15 DIAGNOSIS — D631 Anemia in chronic kidney disease: Secondary | ICD-10-CM | POA: Diagnosis not present

## 2023-03-19 DIAGNOSIS — T80218A Other infection due to central venous catheter, initial encounter: Secondary | ICD-10-CM | POA: Diagnosis not present

## 2023-03-19 DIAGNOSIS — T80211A Bloodstream infection due to central venous catheter, initial encounter: Secondary | ICD-10-CM | POA: Diagnosis not present

## 2023-03-19 DIAGNOSIS — B957 Other staphylococcus as the cause of diseases classified elsewhere: Secondary | ICD-10-CM | POA: Diagnosis not present

## 2023-03-19 DIAGNOSIS — D631 Anemia in chronic kidney disease: Secondary | ICD-10-CM | POA: Diagnosis not present

## 2023-03-19 DIAGNOSIS — N186 End stage renal disease: Secondary | ICD-10-CM | POA: Diagnosis not present

## 2023-03-19 DIAGNOSIS — E119 Type 2 diabetes mellitus without complications: Secondary | ICD-10-CM | POA: Diagnosis not present

## 2023-03-19 DIAGNOSIS — I10 Essential (primary) hypertension: Secondary | ICD-10-CM | POA: Diagnosis not present

## 2023-03-20 DIAGNOSIS — N186 End stage renal disease: Secondary | ICD-10-CM | POA: Diagnosis not present

## 2023-03-20 DIAGNOSIS — T80211A Bloodstream infection due to central venous catheter, initial encounter: Secondary | ICD-10-CM | POA: Diagnosis not present

## 2023-03-20 DIAGNOSIS — I10 Essential (primary) hypertension: Secondary | ICD-10-CM | POA: Diagnosis not present

## 2023-03-20 DIAGNOSIS — D631 Anemia in chronic kidney disease: Secondary | ICD-10-CM | POA: Diagnosis not present

## 2023-03-20 DIAGNOSIS — E119 Type 2 diabetes mellitus without complications: Secondary | ICD-10-CM | POA: Diagnosis not present

## 2023-03-20 DIAGNOSIS — B957 Other staphylococcus as the cause of diseases classified elsewhere: Secondary | ICD-10-CM | POA: Diagnosis not present

## 2023-03-21 DIAGNOSIS — B957 Other staphylococcus as the cause of diseases classified elsewhere: Secondary | ICD-10-CM | POA: Diagnosis not present

## 2023-03-21 DIAGNOSIS — I10 Essential (primary) hypertension: Secondary | ICD-10-CM | POA: Diagnosis not present

## 2023-03-21 DIAGNOSIS — N186 End stage renal disease: Secondary | ICD-10-CM | POA: Diagnosis not present

## 2023-03-21 DIAGNOSIS — E119 Type 2 diabetes mellitus without complications: Secondary | ICD-10-CM | POA: Diagnosis not present

## 2023-03-21 DIAGNOSIS — D631 Anemia in chronic kidney disease: Secondary | ICD-10-CM | POA: Diagnosis not present

## 2023-03-21 DIAGNOSIS — T80211A Bloodstream infection due to central venous catheter, initial encounter: Secondary | ICD-10-CM | POA: Diagnosis not present

## 2023-03-22 DIAGNOSIS — I10 Essential (primary) hypertension: Secondary | ICD-10-CM | POA: Diagnosis not present

## 2023-03-22 DIAGNOSIS — E119 Type 2 diabetes mellitus without complications: Secondary | ICD-10-CM | POA: Diagnosis not present

## 2023-03-22 DIAGNOSIS — N186 End stage renal disease: Secondary | ICD-10-CM | POA: Diagnosis not present

## 2023-03-22 DIAGNOSIS — T80211A Bloodstream infection due to central venous catheter, initial encounter: Secondary | ICD-10-CM | POA: Diagnosis not present

## 2023-03-22 DIAGNOSIS — B957 Other staphylococcus as the cause of diseases classified elsewhere: Secondary | ICD-10-CM | POA: Diagnosis not present

## 2023-03-22 DIAGNOSIS — D631 Anemia in chronic kidney disease: Secondary | ICD-10-CM | POA: Diagnosis not present

## 2023-03-23 DIAGNOSIS — D631 Anemia in chronic kidney disease: Secondary | ICD-10-CM | POA: Diagnosis not present

## 2023-03-23 DIAGNOSIS — E119 Type 2 diabetes mellitus without complications: Secondary | ICD-10-CM | POA: Diagnosis not present

## 2023-03-23 DIAGNOSIS — B957 Other staphylococcus as the cause of diseases classified elsewhere: Secondary | ICD-10-CM | POA: Diagnosis not present

## 2023-03-23 DIAGNOSIS — I10 Essential (primary) hypertension: Secondary | ICD-10-CM | POA: Diagnosis not present

## 2023-03-23 DIAGNOSIS — T80211A Bloodstream infection due to central venous catheter, initial encounter: Secondary | ICD-10-CM | POA: Diagnosis not present

## 2023-03-23 DIAGNOSIS — N186 End stage renal disease: Secondary | ICD-10-CM | POA: Diagnosis not present

## 2023-03-24 DIAGNOSIS — N186 End stage renal disease: Secondary | ICD-10-CM | POA: Diagnosis not present

## 2023-03-24 DIAGNOSIS — T80211A Bloodstream infection due to central venous catheter, initial encounter: Secondary | ICD-10-CM | POA: Diagnosis not present

## 2023-03-24 DIAGNOSIS — D631 Anemia in chronic kidney disease: Secondary | ICD-10-CM | POA: Diagnosis not present

## 2023-03-24 DIAGNOSIS — I10 Essential (primary) hypertension: Secondary | ICD-10-CM | POA: Diagnosis not present

## 2023-03-24 DIAGNOSIS — E119 Type 2 diabetes mellitus without complications: Secondary | ICD-10-CM | POA: Diagnosis not present

## 2023-03-24 DIAGNOSIS — B957 Other staphylococcus as the cause of diseases classified elsewhere: Secondary | ICD-10-CM | POA: Diagnosis not present

## 2023-03-25 DIAGNOSIS — I10 Essential (primary) hypertension: Secondary | ICD-10-CM | POA: Diagnosis not present

## 2023-03-25 DIAGNOSIS — E119 Type 2 diabetes mellitus without complications: Secondary | ICD-10-CM | POA: Diagnosis not present

## 2023-03-25 DIAGNOSIS — N186 End stage renal disease: Secondary | ICD-10-CM | POA: Diagnosis not present

## 2023-03-25 DIAGNOSIS — T80211A Bloodstream infection due to central venous catheter, initial encounter: Secondary | ICD-10-CM | POA: Diagnosis not present

## 2023-03-25 DIAGNOSIS — B957 Other staphylococcus as the cause of diseases classified elsewhere: Secondary | ICD-10-CM | POA: Diagnosis not present

## 2023-03-25 DIAGNOSIS — D631 Anemia in chronic kidney disease: Secondary | ICD-10-CM | POA: Diagnosis not present

## 2023-03-26 DIAGNOSIS — N186 End stage renal disease: Secondary | ICD-10-CM | POA: Diagnosis not present

## 2023-03-26 DIAGNOSIS — T80211A Bloodstream infection due to central venous catheter, initial encounter: Secondary | ICD-10-CM | POA: Diagnosis not present

## 2023-03-26 DIAGNOSIS — I10 Essential (primary) hypertension: Secondary | ICD-10-CM | POA: Diagnosis not present

## 2023-03-26 DIAGNOSIS — B957 Other staphylococcus as the cause of diseases classified elsewhere: Secondary | ICD-10-CM | POA: Diagnosis not present

## 2023-03-26 DIAGNOSIS — E119 Type 2 diabetes mellitus without complications: Secondary | ICD-10-CM | POA: Diagnosis not present

## 2023-03-26 DIAGNOSIS — D631 Anemia in chronic kidney disease: Secondary | ICD-10-CM | POA: Diagnosis not present

## 2023-06-18 ENCOUNTER — Telehealth: Payer: Self-pay | Admitting: *Deleted

## 2023-06-18 NOTE — Progress Notes (Signed)
  Care Coordination  Outreach Note  06/18/2023 Name: ADEDEJI STROSNIDER MRN: [id number] DOB: 1976-06-08   Care Coordination Outreach Attempts: An unsuccessful telephone outreach was attempted today to offer the patient information about available care coordination services.  Follow Up Plan:  Additional outreach attempts will be made to offer the patient care coordination information and services.   Encounter Outcome:  No Answer  Gwenevere Ghazi  Care Coordination Care Guide  Direct Dial: [phone]

## 2023-06-25 NOTE — Progress Notes (Signed)
  Care Coordination  Outreach Note  06/25/2023 Name: Derrick Mosley MRN: [id number] DOB: 06-Jan-1976   Care Coordination Outreach Attempts: A second unsuccessful outreach was attempted today to offer the patient with information about available care coordination services.  Follow Up Plan:  Additional outreach attempts will be made to offer the patient care coordination information and services.   Encounter Outcome:  No Answer  Christie Nottingham  Care Coordination Care Guide  Direct Dial: [phone]

## 2023-07-02 NOTE — Progress Notes (Signed)
  Care Coordination  Outreach Note  07/02/2023 Name: JEMAL GRIFFETH MRN: [id number] DOB: 08/03/1976   Care Coordination Outreach Attempts: A third unsuccessful outreach was attempted today to offer the patient with information about available care coordination services.  Follow Up Plan:  No further outreach attempts will be made at this time. We have been unable to contact the patient to offer or enroll patient in care coordination services  Encounter Outcome:  No Answer  Gwenevere Ghazi  Care Coordination Care Guide  Direct Dial: [phone]

## 2024-06-09 DIAGNOSIS — Z419 Encounter for procedure for purposes other than remedying health state, unspecified: Secondary | ICD-10-CM | POA: Diagnosis not present

## 2024-10-10 ENCOUNTER — Telehealth: Payer: Self-pay

## 2024-10-10 NOTE — Telephone Encounter (Signed)
 Patient was identified as falling into the [place] Measure - Diabetes.   Patient was: Patient is not currently using our practice.
